# Patient Record
Sex: Male | Born: 1935 | Race: Black or African American | Hispanic: No | Smoking: Former smoker
Health system: Southern US, Community
[De-identification: ages and names within clinical notes are randomized; demographics above are authoritative.]

## PROBLEM LIST (undated history)

## (undated) DIAGNOSIS — E785 Hyperlipidemia, unspecified: Secondary | ICD-10-CM

## (undated) DIAGNOSIS — K5790 Diverticulosis of intestine, part unspecified, without perforation or abscess without bleeding: Secondary | ICD-10-CM

## (undated) DIAGNOSIS — I499 Cardiac arrhythmia, unspecified: Secondary | ICD-10-CM

## (undated) DIAGNOSIS — IMO0001 Reserved for inherently not codable concepts without codable children: Secondary | ICD-10-CM

## (undated) DIAGNOSIS — K922 Gastrointestinal hemorrhage, unspecified: Secondary | ICD-10-CM

## (undated) DIAGNOSIS — D649 Anemia, unspecified: Secondary | ICD-10-CM

## (undated) DIAGNOSIS — I2510 Atherosclerotic heart disease of native coronary artery without angina pectoris: Secondary | ICD-10-CM

## (undated) DIAGNOSIS — C61 Malignant neoplasm of prostate: Secondary | ICD-10-CM

## (undated) DIAGNOSIS — S32009A Unspecified fracture of unspecified lumbar vertebra, initial encounter for closed fracture: Secondary | ICD-10-CM

## (undated) DIAGNOSIS — N182 Chronic kidney disease, stage 2 (mild): Secondary | ICD-10-CM

## (undated) DIAGNOSIS — Z5189 Encounter for other specified aftercare: Secondary | ICD-10-CM

## (undated) DIAGNOSIS — K219 Gastro-esophageal reflux disease without esophagitis: Secondary | ICD-10-CM

## (undated) DIAGNOSIS — I219 Acute myocardial infarction, unspecified: Secondary | ICD-10-CM

## (undated) DIAGNOSIS — I10 Essential (primary) hypertension: Secondary | ICD-10-CM

## (undated) DIAGNOSIS — E119 Type 2 diabetes mellitus without complications: Secondary | ICD-10-CM

## (undated) DIAGNOSIS — M1990 Unspecified osteoarthritis, unspecified site: Secondary | ICD-10-CM

## (undated) DIAGNOSIS — F0390 Unspecified dementia without behavioral disturbance: Secondary | ICD-10-CM

## (undated) HISTORY — PX: OTHER SURGICAL HISTORY: SHX169

## (undated) HISTORY — PX: TONSILLECTOMY: SUR1361

---
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
## 1998-03-08 ENCOUNTER — Encounter: Admission: RE | Admit: 1998-03-08 | Discharge: 1998-06-06 | Payer: Self-pay | Admitting: Internal Medicine

## 1998-04-19 ENCOUNTER — Ambulatory Visit (HOSPITAL_COMMUNITY): Admission: RE | Admit: 1998-04-19 | Discharge: 1998-04-19 | Payer: Self-pay | Admitting: Orthopedic Surgery

## 1998-05-10 ENCOUNTER — Emergency Department (HOSPITAL_COMMUNITY): Admission: EM | Admit: 1998-05-10 | Discharge: 1998-05-10 | Payer: Self-pay | Admitting: Emergency Medicine

## 1998-05-16 ENCOUNTER — Emergency Department (HOSPITAL_COMMUNITY): Admission: EM | Admit: 1998-05-16 | Discharge: 1998-05-16 | Payer: Self-pay | Admitting: Emergency Medicine

## 1998-07-12 ENCOUNTER — Encounter: Admission: RE | Admit: 1998-07-12 | Discharge: 1998-10-07 | Payer: Self-pay | Admitting: Internal Medicine

## 1999-01-24 ENCOUNTER — Encounter: Admission: RE | Admit: 1999-01-24 | Discharge: 1999-01-26 | Payer: Self-pay | Admitting: Internal Medicine

## 1999-04-17 ENCOUNTER — Emergency Department (HOSPITAL_COMMUNITY): Admission: EM | Admit: 1999-04-17 | Discharge: 1999-04-17 | Payer: Self-pay | Admitting: Emergency Medicine

## 1999-04-17 ENCOUNTER — Encounter: Payer: Self-pay | Admitting: Emergency Medicine

## 1999-05-08 ENCOUNTER — Encounter: Admission: RE | Admit: 1999-05-08 | Discharge: 1999-05-12 | Payer: Self-pay | Admitting: Internal Medicine

## 1999-09-18 ENCOUNTER — Emergency Department (HOSPITAL_COMMUNITY): Admission: EM | Admit: 1999-09-18 | Discharge: 1999-09-18 | Payer: Self-pay | Admitting: Emergency Medicine

## 1999-11-22 ENCOUNTER — Encounter: Admission: RE | Admit: 1999-11-22 | Discharge: 2000-02-20 | Payer: Self-pay | Admitting: Orthopedic Surgery

## 2000-02-27 ENCOUNTER — Encounter: Admission: RE | Admit: 2000-02-27 | Discharge: 2000-05-27 | Payer: Self-pay | Admitting: Internal Medicine

## 2000-07-10 ENCOUNTER — Encounter: Payer: Self-pay | Admitting: Emergency Medicine

## 2000-07-10 ENCOUNTER — Emergency Department (HOSPITAL_COMMUNITY): Admission: EM | Admit: 2000-07-10 | Discharge: 2000-07-10 | Payer: Self-pay | Admitting: Emergency Medicine

## 2000-11-06 ENCOUNTER — Encounter: Admission: RE | Admit: 2000-11-06 | Discharge: 2000-11-11 | Payer: Self-pay | Admitting: Internal Medicine

## 2001-02-05 ENCOUNTER — Encounter: Admission: RE | Admit: 2001-02-05 | Discharge: 2001-05-06 | Payer: Self-pay | Admitting: Internal Medicine

## 2001-02-12 ENCOUNTER — Emergency Department (HOSPITAL_COMMUNITY): Admission: EM | Admit: 2001-02-12 | Discharge: 2001-02-12 | Payer: Self-pay | Admitting: Emergency Medicine

## 2001-02-12 ENCOUNTER — Encounter: Payer: Self-pay | Admitting: Emergency Medicine

## 2001-04-01 ENCOUNTER — Ambulatory Visit: Admission: RE | Admit: 2001-04-01 | Discharge: 2001-06-30 | Payer: Self-pay | Admitting: Radiation Oncology

## 2001-04-07 ENCOUNTER — Ambulatory Visit (HOSPITAL_COMMUNITY): Admission: RE | Admit: 2001-04-07 | Discharge: 2001-04-07 | Payer: Self-pay | Admitting: Urology

## 2001-04-07 ENCOUNTER — Encounter: Payer: Self-pay | Admitting: Urology

## 2001-05-07 ENCOUNTER — Encounter: Admission: RE | Admit: 2001-05-07 | Discharge: 2001-06-02 | Payer: Self-pay | Admitting: Internal Medicine

## 2001-07-01 ENCOUNTER — Ambulatory Visit: Admission: RE | Admit: 2001-07-01 | Discharge: 2001-09-29 | Payer: Self-pay | Admitting: Radiation Oncology

## 2001-08-13 ENCOUNTER — Encounter: Admission: RE | Admit: 2001-08-13 | Discharge: 2001-08-19 | Payer: Self-pay | Admitting: Internal Medicine

## 2001-09-14 ENCOUNTER — Emergency Department (HOSPITAL_COMMUNITY): Admission: EM | Admit: 2001-09-14 | Discharge: 2001-09-14 | Payer: Self-pay

## 2001-11-09 ENCOUNTER — Encounter: Admission: RE | Admit: 2001-11-09 | Discharge: 2001-12-19 | Payer: Self-pay | Admitting: Internal Medicine

## 2002-01-01 ENCOUNTER — Emergency Department (HOSPITAL_COMMUNITY): Admission: EM | Admit: 2002-01-01 | Discharge: 2002-01-01 | Payer: Self-pay | Admitting: *Deleted

## 2002-02-02 ENCOUNTER — Encounter (HOSPITAL_BASED_OUTPATIENT_CLINIC_OR_DEPARTMENT_OTHER): Admission: RE | Admit: 2002-02-02 | Discharge: 2002-02-27 | Payer: Self-pay | Admitting: Internal Medicine

## 2002-05-06 ENCOUNTER — Emergency Department (HOSPITAL_COMMUNITY): Admission: EM | Admit: 2002-05-06 | Discharge: 2002-05-06 | Payer: Self-pay | Admitting: Emergency Medicine

## 2002-05-11 ENCOUNTER — Encounter (HOSPITAL_BASED_OUTPATIENT_CLINIC_OR_DEPARTMENT_OTHER): Admission: RE | Admit: 2002-05-11 | Discharge: 2002-06-05 | Payer: Self-pay | Admitting: Orthopedic Surgery

## 2002-07-07 ENCOUNTER — Encounter: Payer: Self-pay | Admitting: Emergency Medicine

## 2002-07-07 ENCOUNTER — Emergency Department (HOSPITAL_COMMUNITY): Admission: EM | Admit: 2002-07-07 | Discharge: 2002-07-07 | Payer: Self-pay

## 2002-09-01 ENCOUNTER — Encounter (HOSPITAL_BASED_OUTPATIENT_CLINIC_OR_DEPARTMENT_OTHER): Admission: RE | Admit: 2002-09-01 | Discharge: 2002-11-30 | Payer: Self-pay | Admitting: Internal Medicine

## 2002-09-17 DIAGNOSIS — I219 Acute myocardial infarction, unspecified: Secondary | ICD-10-CM

## 2002-09-17 HISTORY — DX: Acute myocardial infarction, unspecified: I21.9

## 2002-09-21 ENCOUNTER — Encounter: Payer: Self-pay | Admitting: Emergency Medicine

## 2002-09-21 ENCOUNTER — Emergency Department (HOSPITAL_COMMUNITY): Admission: EM | Admit: 2002-09-21 | Discharge: 2002-09-21 | Payer: Self-pay | Admitting: Emergency Medicine

## 2002-09-27 ENCOUNTER — Encounter: Payer: Self-pay | Admitting: Emergency Medicine

## 2002-09-27 ENCOUNTER — Inpatient Hospital Stay (HOSPITAL_COMMUNITY): Admission: EM | Admit: 2002-09-27 | Discharge: 2002-10-04 | Payer: Self-pay | Admitting: Emergency Medicine

## 2002-09-28 ENCOUNTER — Encounter: Payer: Self-pay | Admitting: Cardiovascular Disease

## 2002-09-30 ENCOUNTER — Encounter: Payer: Self-pay | Admitting: Cardiovascular Disease

## 2002-10-01 ENCOUNTER — Encounter: Payer: Self-pay | Admitting: Cardiovascular Disease

## 2002-10-14 ENCOUNTER — Emergency Department (HOSPITAL_COMMUNITY): Admission: EM | Admit: 2002-10-14 | Discharge: 2002-10-14 | Payer: Self-pay | Admitting: Emergency Medicine

## 2002-10-14 ENCOUNTER — Encounter: Payer: Self-pay | Admitting: Emergency Medicine

## 2003-02-23 ENCOUNTER — Encounter (HOSPITAL_BASED_OUTPATIENT_CLINIC_OR_DEPARTMENT_OTHER): Admission: RE | Admit: 2003-02-23 | Discharge: 2003-05-24 | Payer: Self-pay | Admitting: Internal Medicine

## 2003-04-12 ENCOUNTER — Encounter: Payer: Self-pay | Admitting: Emergency Medicine

## 2003-04-12 ENCOUNTER — Emergency Department (HOSPITAL_COMMUNITY): Admission: EM | Admit: 2003-04-12 | Discharge: 2003-04-12 | Payer: Self-pay

## 2003-06-22 ENCOUNTER — Emergency Department (HOSPITAL_COMMUNITY): Admission: AD | Admit: 2003-06-22 | Discharge: 2003-06-22 | Payer: Self-pay | Admitting: Family Medicine

## 2003-08-29 ENCOUNTER — Emergency Department (HOSPITAL_COMMUNITY): Admission: EM | Admit: 2003-08-29 | Discharge: 2003-08-29 | Payer: Self-pay | Admitting: Emergency Medicine

## 2003-09-04 ENCOUNTER — Inpatient Hospital Stay (HOSPITAL_COMMUNITY): Admission: EM | Admit: 2003-09-04 | Discharge: 2003-09-08 | Payer: Self-pay | Admitting: Emergency Medicine

## 2003-09-06 ENCOUNTER — Encounter: Payer: Self-pay | Admitting: Internal Medicine

## 2003-09-30 ENCOUNTER — Encounter (HOSPITAL_BASED_OUTPATIENT_CLINIC_OR_DEPARTMENT_OTHER): Admission: RE | Admit: 2003-09-30 | Discharge: 2003-10-08 | Payer: Self-pay | Admitting: Internal Medicine

## 2003-10-13 ENCOUNTER — Encounter (HOSPITAL_BASED_OUTPATIENT_CLINIC_OR_DEPARTMENT_OTHER): Admission: RE | Admit: 2003-10-13 | Discharge: 2004-01-11 | Payer: Self-pay | Admitting: Internal Medicine

## 2003-12-09 ENCOUNTER — Emergency Department (HOSPITAL_COMMUNITY): Admission: AD | Admit: 2003-12-09 | Discharge: 2003-12-09 | Payer: Self-pay | Admitting: Family Medicine

## 2004-01-12 ENCOUNTER — Encounter (HOSPITAL_BASED_OUTPATIENT_CLINIC_OR_DEPARTMENT_OTHER): Admission: RE | Admit: 2004-01-12 | Discharge: 2004-01-25 | Payer: Self-pay | Admitting: Internal Medicine

## 2004-08-08 ENCOUNTER — Encounter (HOSPITAL_BASED_OUTPATIENT_CLINIC_OR_DEPARTMENT_OTHER): Admission: RE | Admit: 2004-08-08 | Discharge: 2004-11-06 | Payer: Self-pay | Admitting: Internal Medicine

## 2004-12-04 ENCOUNTER — Emergency Department (HOSPITAL_COMMUNITY): Admission: EM | Admit: 2004-12-04 | Discharge: 2004-12-04 | Payer: Self-pay | Admitting: *Deleted

## 2005-11-05 ENCOUNTER — Emergency Department (HOSPITAL_COMMUNITY): Admission: EM | Admit: 2005-11-05 | Discharge: 2005-11-05 | Payer: Self-pay | Admitting: Emergency Medicine

## 2005-12-15 ENCOUNTER — Emergency Department (HOSPITAL_COMMUNITY): Admission: EM | Admit: 2005-12-15 | Discharge: 2005-12-16 | Payer: Self-pay | Admitting: Emergency Medicine

## 2006-02-16 ENCOUNTER — Emergency Department (HOSPITAL_COMMUNITY): Admission: EM | Admit: 2006-02-16 | Discharge: 2006-02-16 | Payer: Self-pay | Admitting: Emergency Medicine

## 2006-03-01 ENCOUNTER — Ambulatory Visit: Payer: Self-pay | Admitting: Internal Medicine

## 2006-03-14 ENCOUNTER — Ambulatory Visit: Payer: Self-pay | Admitting: Internal Medicine

## 2006-03-15 ENCOUNTER — Ambulatory Visit: Payer: Self-pay | Admitting: Cardiology

## 2006-03-22 ENCOUNTER — Ambulatory Visit: Payer: Self-pay | Admitting: Internal Medicine

## 2006-07-22 ENCOUNTER — Ambulatory Visit: Payer: Self-pay | Admitting: Internal Medicine

## 2006-07-22 LAB — CONVERTED CEMR LAB
Hgb A1c MFr Bld: 8.4 % — ABNORMAL HIGH (ref 4.6–6.0)
Microalb Creat Ratio: 4.6 mg/g (ref 0.0–30.0)

## 2006-07-26 ENCOUNTER — Ambulatory Visit: Payer: Self-pay | Admitting: Internal Medicine

## 2006-10-23 ENCOUNTER — Ambulatory Visit: Payer: Self-pay | Admitting: Internal Medicine

## 2006-10-23 LAB — CONVERTED CEMR LAB
BUN: 24 mg/dL — ABNORMAL HIGH (ref 6–23)
Calcium: 10.4 mg/dL (ref 8.4–10.5)
GFR calc Af Amer: 69 mL/min
Glucose, Bld: 133 mg/dL — ABNORMAL HIGH (ref 70–99)
HDL: 47.1 mg/dL (ref >39.0)
Hgb A1c MFr Bld: 8.8 % — ABNORMAL HIGH (ref 4.6–6.0)
Potassium: 4.5 meq/L (ref 3.5–5.1)
Total CHOL/HDL Ratio: 5

## 2006-11-14 ENCOUNTER — Ambulatory Visit: Payer: Self-pay | Admitting: Internal Medicine

## 2006-12-20 ENCOUNTER — Ambulatory Visit: Payer: Self-pay | Admitting: Internal Medicine

## 2007-02-12 DIAGNOSIS — I10 Essential (primary) hypertension: Secondary | ICD-10-CM

## 2007-02-12 DIAGNOSIS — M109 Gout, unspecified: Secondary | ICD-10-CM

## 2007-06-02 ENCOUNTER — Emergency Department (HOSPITAL_COMMUNITY): Admission: EM | Admit: 2007-06-02 | Discharge: 2007-06-02 | Payer: Self-pay | Admitting: Emergency Medicine

## 2007-06-18 ENCOUNTER — Encounter (INDEPENDENT_AMBULATORY_CARE_PROVIDER_SITE_OTHER): Payer: Self-pay | Admitting: *Deleted

## 2007-06-25 ENCOUNTER — Ambulatory Visit: Payer: Self-pay | Admitting: Internal Medicine

## 2007-06-25 DIAGNOSIS — E1165 Type 2 diabetes mellitus with hyperglycemia: Secondary | ICD-10-CM

## 2007-06-25 DIAGNOSIS — Z8719 Personal history of other diseases of the digestive system: Secondary | ICD-10-CM

## 2007-06-25 DIAGNOSIS — Z8546 Personal history of malignant neoplasm of prostate: Secondary | ICD-10-CM

## 2007-06-25 DIAGNOSIS — E785 Hyperlipidemia, unspecified: Secondary | ICD-10-CM | POA: Insufficient documentation

## 2007-06-26 LAB — CONVERTED CEMR LAB: Creatinine,U: 72.8 mg/dL

## 2007-09-02 ENCOUNTER — Encounter (INDEPENDENT_AMBULATORY_CARE_PROVIDER_SITE_OTHER): Payer: Self-pay | Admitting: *Deleted

## 2007-09-26 ENCOUNTER — Ambulatory Visit: Payer: Self-pay | Admitting: Internal Medicine

## 2007-09-26 DIAGNOSIS — R9431 Abnormal electrocardiogram [ECG] [EKG]: Secondary | ICD-10-CM

## 2007-09-29 ENCOUNTER — Telehealth (INDEPENDENT_AMBULATORY_CARE_PROVIDER_SITE_OTHER): Payer: Self-pay | Admitting: *Deleted

## 2007-10-07 LAB — CONVERTED CEMR LAB
ALT: 30 units/L (ref 0–53)
AST: 26 units/L (ref 0–37)
BUN: 16 mg/dL (ref 6–23)
Basophils Absolute: 0.1 10*3/uL (ref 0.0–0.1)
Cholesterol: 217 mg/dL (ref 0–200)
Creatinine, Ser: 1.3 mg/dL (ref 0.4–1.5)
Creatinine,U: 127.6 mg/dL
Direct LDL: 144.2 mg/dL
GFR calc Af Amer: 68 mL/min
Glucose, Bld: 168 mg/dL — ABNORMAL HIGH (ref 70–99)
HCT: 44.2 % (ref 39.0–52.0)
HDL: 50.9 mg/dL (ref >39.0)
Hemoglobin: 14.8 g/dL (ref 13.0–17.0)
Microalb Creat Ratio: 19.6 mg/g (ref 0.0–30.0)
Neutro Abs: 4 10*3/uL (ref 1.4–7.7)
PSA: 2.76 ng/mL (ref 0.10–4.00)
Potassium: 4.1 meq/L (ref 3.5–5.1)
Total CHOL/HDL Ratio: 4.3
Triglycerides: 101 mg/dL (ref 0–149)

## 2007-10-09 ENCOUNTER — Ambulatory Visit: Payer: Self-pay

## 2007-10-09 ENCOUNTER — Encounter: Payer: Self-pay | Admitting: Internal Medicine

## 2007-10-20 ENCOUNTER — Encounter (INDEPENDENT_AMBULATORY_CARE_PROVIDER_SITE_OTHER): Payer: Self-pay | Admitting: *Deleted

## 2007-10-29 ENCOUNTER — Encounter: Payer: Self-pay | Admitting: Internal Medicine

## 2007-11-26 ENCOUNTER — Encounter: Payer: Self-pay | Admitting: Internal Medicine

## 2008-01-25 ENCOUNTER — Emergency Department (HOSPITAL_COMMUNITY): Admission: EM | Admit: 2008-01-25 | Discharge: 2008-01-25 | Payer: Self-pay | Admitting: Emergency Medicine

## 2008-02-11 ENCOUNTER — Encounter (INDEPENDENT_AMBULATORY_CARE_PROVIDER_SITE_OTHER): Payer: Self-pay | Admitting: *Deleted

## 2008-02-12 ENCOUNTER — Emergency Department (HOSPITAL_COMMUNITY): Admission: EM | Admit: 2008-02-12 | Discharge: 2008-02-12 | Payer: Self-pay | Admitting: Family Medicine

## 2008-02-19 ENCOUNTER — Telehealth (INDEPENDENT_AMBULATORY_CARE_PROVIDER_SITE_OTHER): Payer: Self-pay | Admitting: *Deleted

## 2008-02-19 ENCOUNTER — Ambulatory Visit: Payer: Self-pay | Admitting: Internal Medicine

## 2008-02-19 DIAGNOSIS — K219 Gastro-esophageal reflux disease without esophagitis: Secondary | ICD-10-CM | POA: Insufficient documentation

## 2008-02-19 DIAGNOSIS — M1990 Unspecified osteoarthritis, unspecified site: Secondary | ICD-10-CM | POA: Insufficient documentation

## 2008-02-20 ENCOUNTER — Telehealth (INDEPENDENT_AMBULATORY_CARE_PROVIDER_SITE_OTHER): Payer: Self-pay | Admitting: *Deleted

## 2008-02-29 ENCOUNTER — Emergency Department (HOSPITAL_COMMUNITY): Admission: EM | Admit: 2008-02-29 | Discharge: 2008-02-29 | Payer: Self-pay | Admitting: Emergency Medicine

## 2008-03-05 ENCOUNTER — Encounter (INDEPENDENT_AMBULATORY_CARE_PROVIDER_SITE_OTHER): Payer: Self-pay | Admitting: *Deleted

## 2008-03-08 ENCOUNTER — Ambulatory Visit: Payer: Self-pay | Admitting: Internal Medicine

## 2008-03-08 ENCOUNTER — Telehealth: Payer: Self-pay | Admitting: Internal Medicine

## 2008-03-08 LAB — CONVERTED CEMR LAB: Glucose, Bld: 307 mg/dL

## 2008-03-09 ENCOUNTER — Telehealth: Payer: Self-pay | Admitting: Internal Medicine

## 2008-03-10 ENCOUNTER — Telehealth (INDEPENDENT_AMBULATORY_CARE_PROVIDER_SITE_OTHER): Payer: Self-pay | Admitting: *Deleted

## 2008-03-10 ENCOUNTER — Ambulatory Visit: Payer: Self-pay | Admitting: Internal Medicine

## 2008-03-10 LAB — CONVERTED CEMR LAB
ALT: 63 units/L — ABNORMAL HIGH (ref 0–53)
Basophils Relative: 0.2 % (ref 0.0–1.0)
Chloride: 103 meq/L (ref 96–112)
Eosinophils Relative: 1.5 % (ref 0.0–5.0)
GFR calc Af Amer: 58 mL/min
Glucose, Bld: 309 mg/dL — ABNORMAL HIGH (ref 70–99)
HCT: 34.7 % — ABNORMAL LOW (ref 39.0–52.0)
Hemoglobin: 11.4 g/dL — ABNORMAL LOW (ref 13.0–17.0)
Hgb A1c MFr Bld: 9.1 % — ABNORMAL HIGH (ref 4.6–6.0)
Neutro Abs: 12.3 10*3/uL — ABNORMAL HIGH (ref 1.4–7.7)
Platelets: 367 10*3/uL (ref 150–400)
Potassium: 4.3 meq/L (ref 3.5–5.1)
Uric Acid, Serum: 8.1 mg/dL — ABNORMAL HIGH (ref 4.0–7.8)
WBC: 14.2 10*3/uL — ABNORMAL HIGH (ref 4.5–10.5)

## 2008-03-11 ENCOUNTER — Encounter (INDEPENDENT_AMBULATORY_CARE_PROVIDER_SITE_OTHER): Payer: Self-pay | Admitting: *Deleted

## 2008-03-12 ENCOUNTER — Telehealth: Payer: Self-pay | Admitting: Internal Medicine

## 2008-03-16 ENCOUNTER — Emergency Department (HOSPITAL_COMMUNITY): Admission: EM | Admit: 2008-03-16 | Discharge: 2008-03-17 | Payer: Self-pay | Admitting: Emergency Medicine

## 2008-03-16 ENCOUNTER — Encounter: Payer: Self-pay | Admitting: Internal Medicine

## 2008-03-18 ENCOUNTER — Telehealth (INDEPENDENT_AMBULATORY_CARE_PROVIDER_SITE_OTHER): Payer: Self-pay | Admitting: *Deleted

## 2008-03-22 ENCOUNTER — Telehealth (INDEPENDENT_AMBULATORY_CARE_PROVIDER_SITE_OTHER): Payer: Self-pay | Admitting: *Deleted

## 2008-04-05 ENCOUNTER — Telehealth (INDEPENDENT_AMBULATORY_CARE_PROVIDER_SITE_OTHER): Payer: Self-pay | Admitting: *Deleted

## 2008-04-05 ENCOUNTER — Emergency Department (HOSPITAL_COMMUNITY): Admission: EM | Admit: 2008-04-05 | Discharge: 2008-04-05 | Payer: Self-pay | Admitting: *Deleted

## 2008-04-06 ENCOUNTER — Ambulatory Visit: Payer: Self-pay | Admitting: Internal Medicine

## 2008-04-06 DIAGNOSIS — F411 Generalized anxiety disorder: Secondary | ICD-10-CM | POA: Insufficient documentation

## 2008-04-08 ENCOUNTER — Encounter: Payer: Self-pay | Admitting: Internal Medicine

## 2008-04-09 ENCOUNTER — Encounter: Payer: Self-pay | Admitting: Internal Medicine

## 2008-04-09 ENCOUNTER — Telehealth: Payer: Self-pay | Admitting: Internal Medicine

## 2008-04-21 ENCOUNTER — Telehealth (INDEPENDENT_AMBULATORY_CARE_PROVIDER_SITE_OTHER): Payer: Self-pay | Admitting: *Deleted

## 2008-04-26 ENCOUNTER — Ambulatory Visit: Payer: Self-pay | Admitting: Internal Medicine

## 2008-05-06 ENCOUNTER — Encounter: Payer: Self-pay | Admitting: Internal Medicine

## 2008-05-16 ENCOUNTER — Encounter: Payer: Self-pay | Admitting: Internal Medicine

## 2008-05-18 ENCOUNTER — Telehealth: Payer: Self-pay | Admitting: Internal Medicine

## 2008-05-25 ENCOUNTER — Telehealth: Payer: Self-pay | Admitting: Internal Medicine

## 2008-06-16 ENCOUNTER — Ambulatory Visit: Payer: Self-pay | Admitting: Internal Medicine

## 2008-06-21 ENCOUNTER — Encounter (INDEPENDENT_AMBULATORY_CARE_PROVIDER_SITE_OTHER): Payer: Self-pay | Admitting: *Deleted

## 2008-06-29 ENCOUNTER — Encounter (INDEPENDENT_AMBULATORY_CARE_PROVIDER_SITE_OTHER): Payer: Self-pay | Admitting: *Deleted

## 2008-07-02 ENCOUNTER — Ambulatory Visit: Payer: Self-pay | Admitting: Internal Medicine

## 2008-07-02 ENCOUNTER — Telehealth: Payer: Self-pay | Admitting: Internal Medicine

## 2008-07-06 LAB — CONVERTED CEMR LAB
ALT: 21 units/L (ref 0–53)
BUN: 21 mg/dL (ref 6–23)
CO2: 26 meq/L (ref 19–32)
Cholesterol: 197 mg/dL (ref 0–200)
Glucose, Bld: 105 mg/dL — ABNORMAL HIGH (ref 70–99)
HDL: 71.5 mg/dL (ref >39.0)
LDL Cholesterol: 110 mg/dL — ABNORMAL HIGH (ref 0–99)
Potassium: 4.5 meq/L (ref 3.5–5.1)
Sodium: 140 meq/L (ref 135–145)
Total CHOL/HDL Ratio: 2.8
Triglycerides: 79 mg/dL (ref 0–149)
Uric Acid, Serum: 5 mg/dL (ref 4.0–7.8)
VLDL: 16 mg/dL (ref 0–40)

## 2008-07-22 ENCOUNTER — Ambulatory Visit: Payer: Self-pay | Admitting: Internal Medicine

## 2008-07-27 ENCOUNTER — Telehealth (INDEPENDENT_AMBULATORY_CARE_PROVIDER_SITE_OTHER): Payer: Self-pay | Admitting: *Deleted

## 2008-08-04 ENCOUNTER — Telehealth: Payer: Self-pay | Admitting: Internal Medicine

## 2008-08-05 ENCOUNTER — Encounter: Payer: Self-pay | Admitting: Internal Medicine

## 2008-08-16 ENCOUNTER — Encounter: Payer: Self-pay | Admitting: Internal Medicine

## 2008-10-27 ENCOUNTER — Encounter: Payer: Self-pay | Admitting: Internal Medicine

## 2008-10-28 ENCOUNTER — Encounter: Payer: Self-pay | Admitting: Internal Medicine

## 2008-11-03 ENCOUNTER — Encounter: Payer: Self-pay | Admitting: Internal Medicine

## 2008-11-29 ENCOUNTER — Encounter: Payer: Self-pay | Admitting: Internal Medicine

## 2008-12-03 ENCOUNTER — Telehealth (INDEPENDENT_AMBULATORY_CARE_PROVIDER_SITE_OTHER): Payer: Self-pay | Admitting: *Deleted

## 2008-12-07 ENCOUNTER — Telehealth (INDEPENDENT_AMBULATORY_CARE_PROVIDER_SITE_OTHER): Payer: Self-pay | Admitting: *Deleted

## 2008-12-13 ENCOUNTER — Telehealth (INDEPENDENT_AMBULATORY_CARE_PROVIDER_SITE_OTHER): Payer: Self-pay | Admitting: *Deleted

## 2008-12-21 ENCOUNTER — Telehealth (INDEPENDENT_AMBULATORY_CARE_PROVIDER_SITE_OTHER): Payer: Self-pay | Admitting: *Deleted

## 2009-01-05 ENCOUNTER — Encounter: Payer: Self-pay | Admitting: Internal Medicine

## 2009-02-17 ENCOUNTER — Encounter: Admission: RE | Admit: 2009-02-17 | Discharge: 2009-02-17 | Payer: Self-pay | Admitting: Family Medicine

## 2009-02-25 ENCOUNTER — Inpatient Hospital Stay (HOSPITAL_COMMUNITY): Admission: EM | Admit: 2009-02-25 | Discharge: 2009-02-26 | Payer: Self-pay | Admitting: Emergency Medicine

## 2009-03-17 ENCOUNTER — Encounter (HOSPITAL_COMMUNITY): Admission: RE | Admit: 2009-03-17 | Discharge: 2009-05-19 | Payer: Self-pay | Admitting: Family Medicine

## 2009-04-03 ENCOUNTER — Inpatient Hospital Stay (HOSPITAL_COMMUNITY): Admission: EM | Admit: 2009-04-03 | Discharge: 2009-04-07 | Payer: Self-pay | Admitting: Emergency Medicine

## 2009-06-08 ENCOUNTER — Encounter: Payer: Self-pay | Admitting: Internal Medicine

## 2009-10-25 ENCOUNTER — Observation Stay (HOSPITAL_COMMUNITY): Admission: EM | Admit: 2009-10-25 | Discharge: 2009-10-25 | Payer: Self-pay | Admitting: Emergency Medicine

## 2009-11-12 IMAGING — CR DG ABDOMEN ACUTE W/ 1V CHEST
4 series · 4 of 4 positions shown · non-contrast
Comparison: None

CLINICAL DATA: Abdominal pain

ACUTE ABDOMEN SERIES (ABDOMEN 2 VIEW & CHEST 1 VIEW)

[w chest pa]
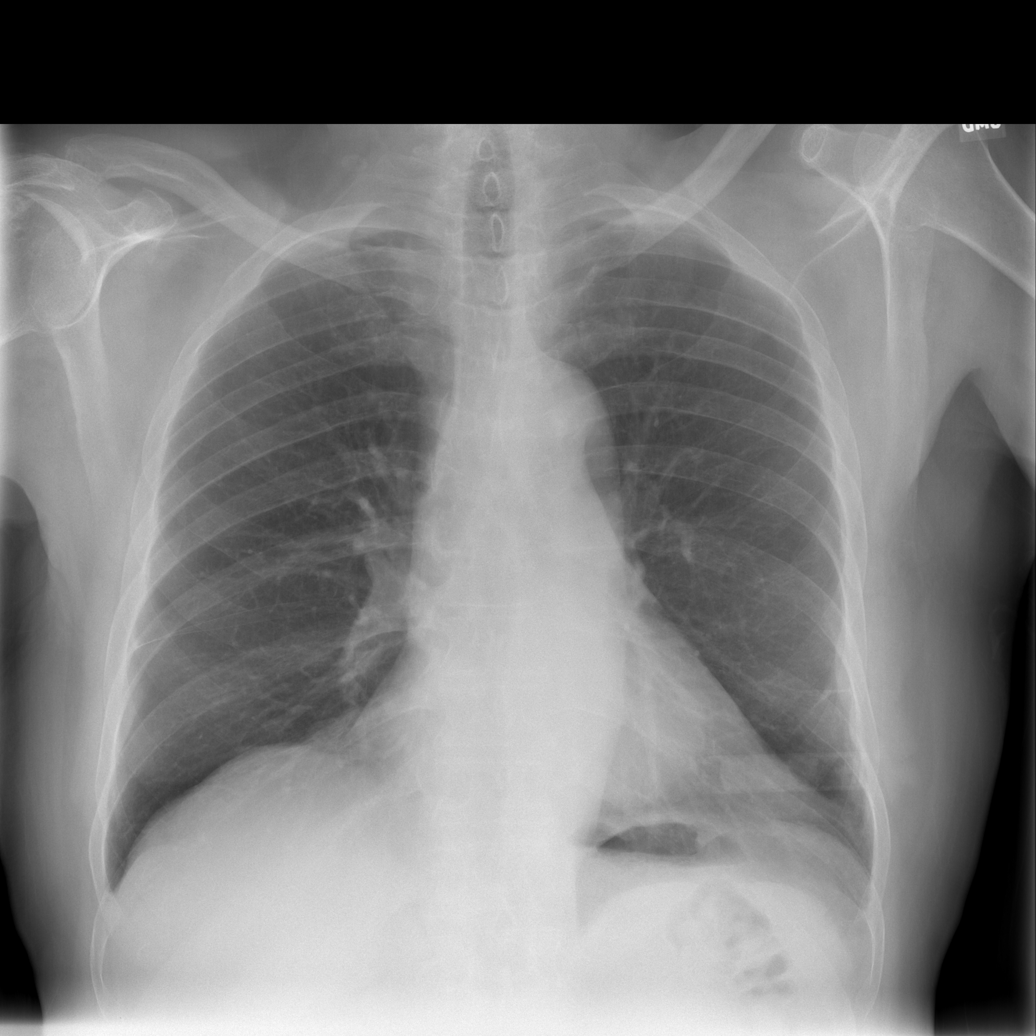

[w abdomen upright *]
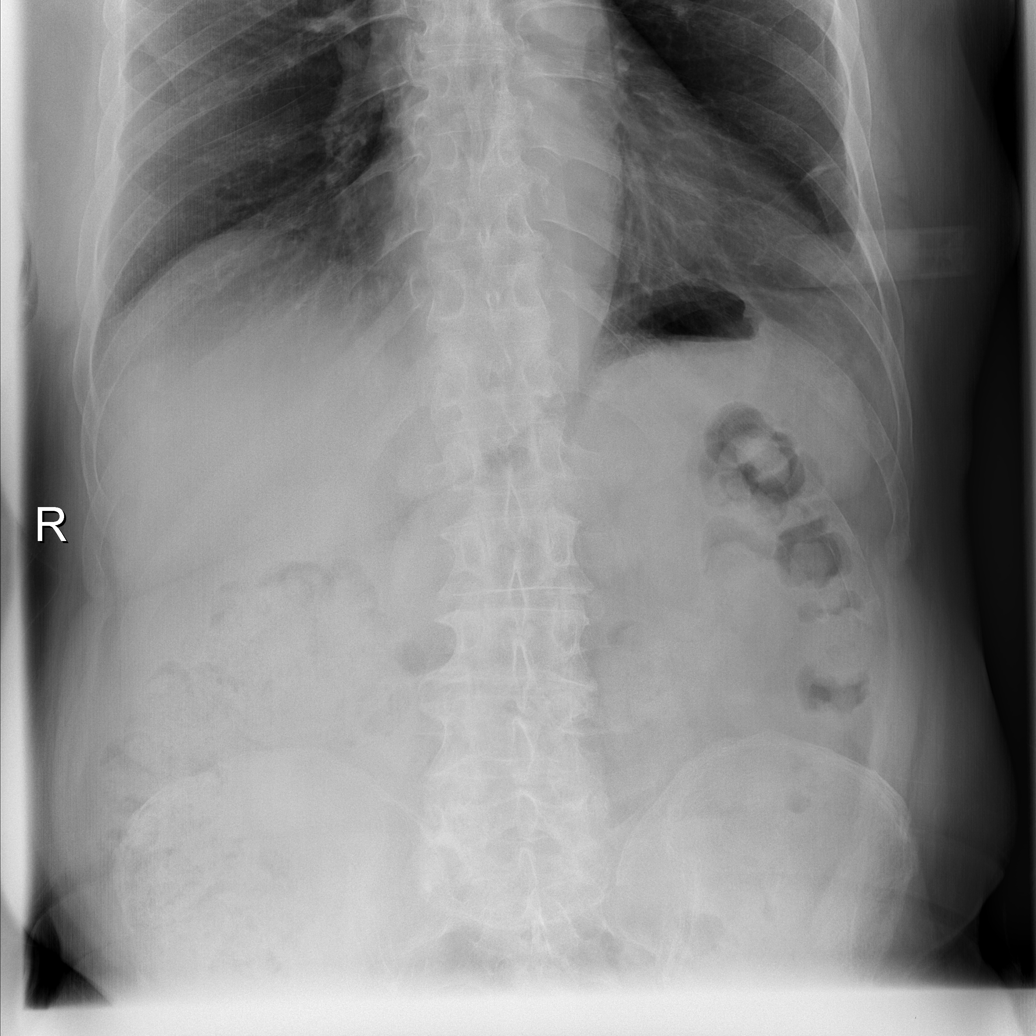

[t abdomen supine (1 of 2)]
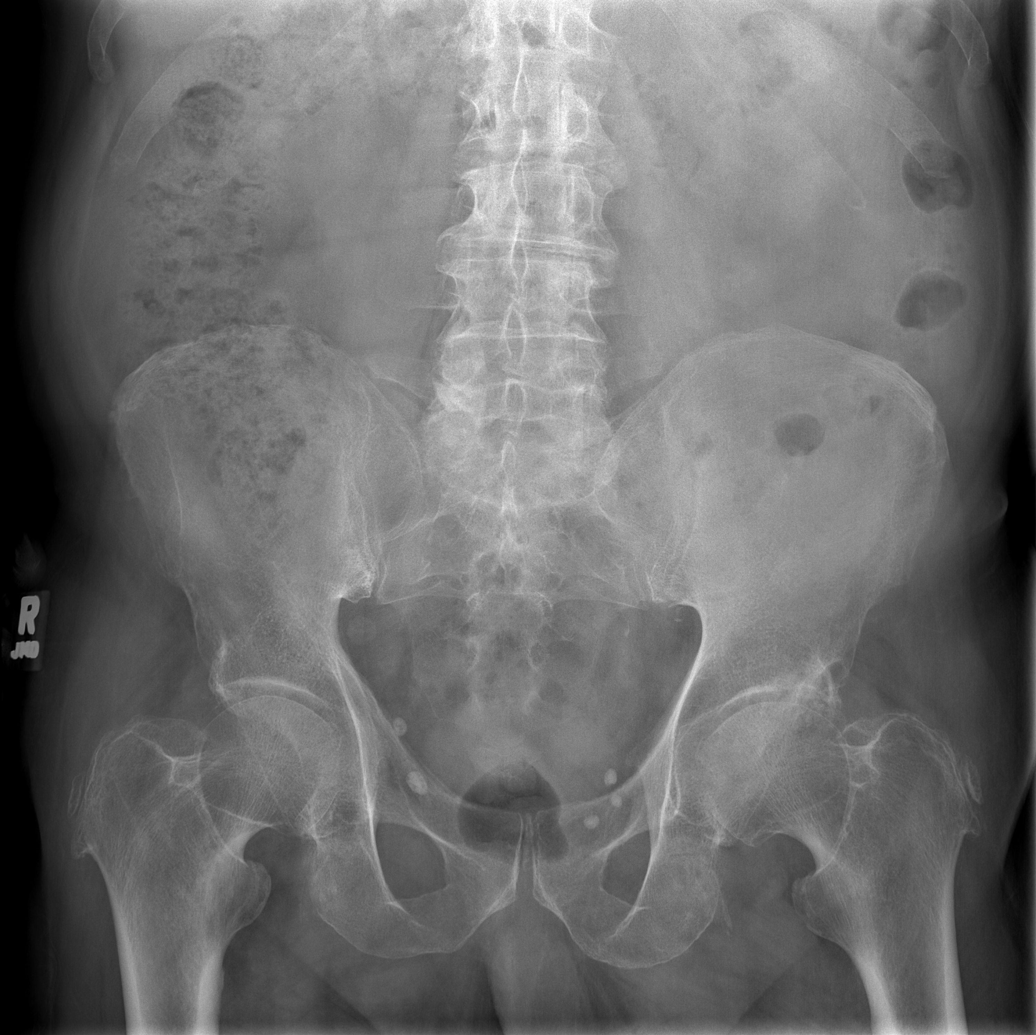

[t abdomen supine (2 of 2)]
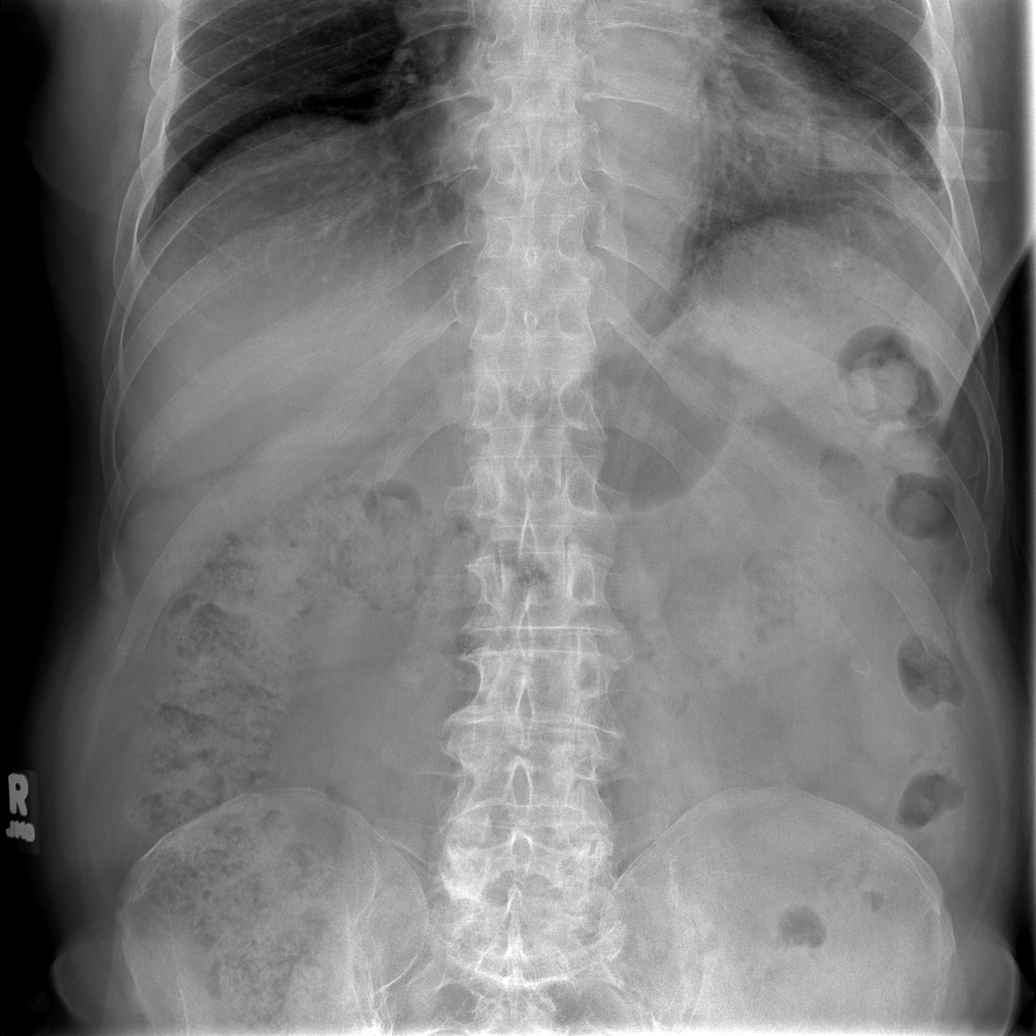

[4 of 4 positions shown; findings below may reference images not displayed]

FINDINGS: Heart size is normal.

No pleural effusions or pulmonary edema.

There is no airspace densities identified.

No abnormally dilated loops of small bowel noted.

There are no air-fluid levels.

Gas and stool seen throughout the colon up to the level the rectum.
IMPRESSION: 1.  Nonobstructive bowel gas pattern.

## 2010-06-19 ENCOUNTER — Emergency Department (HOSPITAL_COMMUNITY)
Admission: EM | Admit: 2010-06-19 | Discharge: 2010-06-20 | Payer: Self-pay | Source: Home / Self Care | Admitting: Emergency Medicine

## 2010-08-03 ENCOUNTER — Emergency Department (HOSPITAL_COMMUNITY): Admission: EM | Admit: 2010-08-03 | Discharge: 2010-08-03 | Payer: Self-pay | Admitting: Emergency Medicine

## 2010-10-08 ENCOUNTER — Encounter: Payer: Self-pay | Admitting: Internal Medicine

## 2010-11-30 LAB — URINALYSIS, ROUTINE W REFLEX MICROSCOPIC
Bilirubin Urine: NEGATIVE
Ketones, ur: NEGATIVE mg/dL
Nitrite: NEGATIVE
Protein, ur: NEGATIVE mg/dL

## 2010-11-30 LAB — URINE CULTURE
Colony Count: >=100000
Culture  Setup Time: 201110040449

## 2010-11-30 LAB — BASIC METABOLIC PANEL
CO2: 26 mEq/L (ref 19–32)
Calcium: 9.3 mg/dL (ref 8.4–10.5)
Chloride: 103 mEq/L (ref 96–112)
GFR calc Af Amer: 51 mL/min — ABNORMAL LOW (ref >60)
Sodium: 135 mEq/L (ref 135–145)

## 2010-11-30 LAB — RAPID URINE DRUG SCREEN, HOSP PERFORMED
Benzodiazepines: NOT DETECTED
Cocaine: NOT DETECTED
Tetrahydrocannabinol: NOT DETECTED

## 2010-11-30 LAB — CBC
HCT: 43.2 % (ref 39.0–52.0)
Hemoglobin: 14.3 g/dL (ref 13.0–17.0)
MCHC: 33 g/dL (ref 30.0–36.0)
RBC: 5.27 MIL/uL (ref 4.22–5.81)
WBC: 7.9 10*3/uL (ref 4.0–10.5)

## 2010-11-30 LAB — DIFFERENTIAL
Lymphocytes Relative: 18 % (ref 12–46)
Lymphs Abs: 1.4 10*3/uL (ref 0.7–4.0)
Monocytes Absolute: 0.6 10*3/uL (ref 0.1–1.0)
Monocytes Relative: 8 % (ref 3–12)
Neutro Abs: 5.7 10*3/uL (ref 1.7–7.7)

## 2010-11-30 LAB — GLUCOSE, CAPILLARY: Glucose-Capillary: 173 mg/dL — ABNORMAL HIGH (ref 70–99)

## 2010-11-30 LAB — ETHANOL: Alcohol, Ethyl (B): <5 mg/dL (ref 0–10)

## 2010-12-06 LAB — URINE CULTURE

## 2010-12-06 LAB — BASIC METABOLIC PANEL
CO2: 24 mEq/L (ref 19–32)
Calcium: 9.6 mg/dL (ref 8.4–10.5)
Chloride: 99 mEq/L (ref 96–112)
Glucose, Bld: 425 mg/dL — ABNORMAL HIGH (ref 70–99)
Sodium: 130 mEq/L — ABNORMAL LOW (ref 135–145)

## 2010-12-06 LAB — GLUCOSE, CAPILLARY
Glucose-Capillary: 237 mg/dL — ABNORMAL HIGH (ref 70–99)
Glucose-Capillary: 271 mg/dL — ABNORMAL HIGH (ref 70–99)

## 2010-12-06 LAB — URINALYSIS, ROUTINE W REFLEX MICROSCOPIC
Bilirubin Urine: NEGATIVE
Glucose, UA: >1000 mg/dL — AB
Specific Gravity, Urine: 1.019 (ref 1.005–1.030)
Urobilinogen, UA: 0.2 mg/dL (ref 0.0–1.0)

## 2010-12-06 LAB — URINE MICROSCOPIC-ADD ON

## 2010-12-24 LAB — COMPREHENSIVE METABOLIC PANEL
ALT: 23 U/L (ref 0–53)
Albumin: 3.8 g/dL (ref 3.5–5.2)
Alkaline Phosphatase: 63 U/L (ref 39–117)
Alkaline Phosphatase: 82 U/L (ref 39–117)
BUN: 21 mg/dL (ref 6–23)
BUN: 9 mg/dL (ref 6–23)
Calcium: 9.8 mg/dL (ref 8.4–10.5)
Creatinine, Ser: 0.96 mg/dL (ref 0.4–1.5)
Glucose, Bld: 146 mg/dL — ABNORMAL HIGH (ref 70–99)
Glucose, Bld: 689 mg/dL (ref 70–99)
Potassium: 3.9 mEq/L (ref 3.5–5.1)
Potassium: 5.1 mEq/L (ref 3.5–5.1)
Sodium: 128 mEq/L — ABNORMAL LOW (ref 135–145)
Total Bilirubin: 0.6 mg/dL (ref 0.3–1.2)
Total Protein: 6 g/dL (ref 6.0–8.3)
Total Protein: 7.6 g/dL (ref 6.0–8.3)

## 2010-12-24 LAB — GLUCOSE, CAPILLARY
Glucose-Capillary: 115 mg/dL — ABNORMAL HIGH (ref 70–99)
Glucose-Capillary: 123 mg/dL — ABNORMAL HIGH (ref 70–99)
Glucose-Capillary: 179 mg/dL — ABNORMAL HIGH (ref 70–99)
Glucose-Capillary: 224 mg/dL — ABNORMAL HIGH (ref 70–99)
Glucose-Capillary: 234 mg/dL — ABNORMAL HIGH (ref 70–99)
Glucose-Capillary: 246 mg/dL — ABNORMAL HIGH (ref 70–99)
Glucose-Capillary: 304 mg/dL — ABNORMAL HIGH (ref 70–99)
Glucose-Capillary: 349 mg/dL — ABNORMAL HIGH (ref 70–99)
Glucose-Capillary: 378 mg/dL — ABNORMAL HIGH (ref 70–99)
Glucose-Capillary: 421 mg/dL — ABNORMAL HIGH (ref 70–99)
Glucose-Capillary: 532 mg/dL (ref 70–99)
Glucose-Capillary: 592 mg/dL (ref 70–99)

## 2010-12-24 LAB — BASIC METABOLIC PANEL
BUN: 9 mg/dL (ref 6–23)
Chloride: 106 mEq/L (ref 96–112)
Glucose, Bld: 226 mg/dL — ABNORMAL HIGH (ref 70–99)
Potassium: 4.5 mEq/L (ref 3.5–5.1)

## 2010-12-24 LAB — URINALYSIS, ROUTINE W REFLEX MICROSCOPIC
Ketones, ur: NEGATIVE mg/dL
Leukocytes, UA: NEGATIVE
Nitrite: NEGATIVE
Protein, ur: NEGATIVE mg/dL
Urobilinogen, UA: 0.2 mg/dL (ref 0.0–1.0)

## 2010-12-24 LAB — KETONES, QUALITATIVE: Acetone, Bld: NEGATIVE

## 2010-12-24 LAB — CBC
HCT: 36.2 % — ABNORMAL LOW (ref 39.0–52.0)
Hemoglobin: 11.9 g/dL — ABNORMAL LOW (ref 13.0–17.0)
Hemoglobin: 13.6 g/dL (ref 13.0–17.0)
MCHC: 32 g/dL (ref 30.0–36.0)
MCV: 83.9 fL (ref 78.0–100.0)
Platelets: 256 10*3/uL (ref 150–400)
RDW: 12.9 % (ref 11.5–15.5)
RDW: 13.4 % (ref 11.5–15.5)

## 2010-12-24 LAB — DIFFERENTIAL
Lymphs Abs: 1.6 10*3/uL (ref 0.7–4.0)
Monocytes Absolute: 1.1 10*3/uL — ABNORMAL HIGH (ref 0.1–1.0)
Monocytes Relative: 12 % (ref 3–12)
Neutro Abs: 6.2 10*3/uL (ref 1.7–7.7)
Neutrophils Relative %: 69 % (ref 43–77)

## 2010-12-24 LAB — CULTURE, BLOOD (ROUTINE X 2): Culture: NO GROWTH

## 2010-12-24 LAB — CK TOTAL AND CKMB (NOT AT ARMC)
CK, MB: 1.8 ng/mL (ref 0.3–4.0)
Total CK: 99 U/L (ref 7–232)

## 2010-12-24 LAB — TROPONIN I: Troponin I: 0.02 ng/mL (ref 0.00–0.06)

## 2010-12-24 LAB — URINE MICROSCOPIC-ADD ON: Urine-Other: NONE SEEN

## 2010-12-25 LAB — CBC
HCT: 40 % (ref 39.0–52.0)
Hemoglobin: 12.2 g/dL — ABNORMAL LOW (ref 13.0–17.0)
Hemoglobin: 13 g/dL (ref 13.0–17.0)
Hemoglobin: 13.4 g/dL (ref 13.0–17.0)
MCHC: 32.5 g/dL (ref 30.0–36.0)
MCHC: 32.7 g/dL (ref 30.0–36.0)
MCV: 83.7 fL (ref 78.0–100.0)
RBC: 4.41 MIL/uL (ref 4.22–5.81)
RBC: 4.78 MIL/uL (ref 4.22–5.81)
RBC: 4.94 MIL/uL (ref 4.22–5.81)
RDW: 12.9 % (ref 11.5–15.5)
RDW: 13.5 % (ref 11.5–15.5)
WBC: 6.7 10*3/uL (ref 4.0–10.5)

## 2010-12-25 LAB — COMPREHENSIVE METABOLIC PANEL
ALT: 19 U/L (ref 0–53)
Alkaline Phosphatase: 64 U/L (ref 39–117)
CO2: 25 mEq/L (ref 19–32)
Chloride: 103 mEq/L (ref 96–112)
GFR calc non Af Amer: 54 mL/min — ABNORMAL LOW (ref >60)
Glucose, Bld: 301 mg/dL — ABNORMAL HIGH (ref 70–99)
Potassium: 4.3 mEq/L (ref 3.5–5.1)
Sodium: 134 mEq/L — ABNORMAL LOW (ref 135–145)
Total Bilirubin: 0.5 mg/dL (ref 0.3–1.2)

## 2010-12-25 LAB — CARDIAC PANEL(CRET KIN+CKTOT+MB+TROPI)
CK, MB: 1.3 ng/mL (ref 0.3–4.0)
CK, MB: 1.6 ng/mL (ref 0.3–4.0)
Relative Index: INVALID (ref 0.0–2.5)
Total CK: 76 U/L (ref 7–232)
Total CK: 89 U/L (ref 7–232)
Total CK: 94 U/L (ref 7–232)
Troponin I: 0.01 ng/mL (ref 0.00–0.06)

## 2010-12-25 LAB — LIPID PANEL
Cholesterol: 190 mg/dL (ref 0–200)
LDL Cholesterol: 129 mg/dL — ABNORMAL HIGH (ref 0–99)
Total CHOL/HDL Ratio: 4.5 RATIO
Triglycerides: 97 mg/dL (ref <150)
VLDL: 19 mg/dL (ref 0–40)

## 2010-12-25 LAB — BASIC METABOLIC PANEL
BUN: 13 mg/dL (ref 6–23)
CO2: 25 mEq/L (ref 19–32)
Calcium: 9.4 mg/dL (ref 8.4–10.5)
Calcium: 9.9 mg/dL (ref 8.4–10.5)
Creatinine, Ser: 1.15 mg/dL (ref 0.4–1.5)
GFR calc Af Amer: >60 mL/min (ref >60)
GFR calc non Af Amer: >60 mL/min (ref >60)
GFR calc non Af Amer: >60 mL/min (ref >60)
Glucose, Bld: 218 mg/dL — ABNORMAL HIGH (ref 70–99)
Glucose, Bld: 222 mg/dL — ABNORMAL HIGH (ref 70–99)
Sodium: 133 mEq/L — ABNORMAL LOW (ref 135–145)
Sodium: 136 mEq/L (ref 135–145)

## 2010-12-25 LAB — DIFFERENTIAL
Basophils Absolute: 0 10*3/uL (ref 0.0–0.1)
Eosinophils Relative: 2 % (ref 0–5)
Lymphocytes Relative: 24 % (ref 12–46)

## 2010-12-25 LAB — CK TOTAL AND CKMB (NOT AT ARMC)
CK, MB: 1.3 ng/mL (ref 0.3–4.0)
Relative Index: 1.1 (ref 0.0–2.5)

## 2010-12-25 LAB — POCT CARDIAC MARKERS: CKMB, poc: <1 ng/mL — ABNORMAL LOW (ref 1.0–8.0)

## 2010-12-25 LAB — TROPONIN I: Troponin I: 0.01 ng/mL (ref 0.00–0.06)

## 2010-12-25 LAB — HEMOGLOBIN A1C: Hgb A1c MFr Bld: 12.6 % — ABNORMAL HIGH (ref 4.6–6.1)

## 2010-12-25 LAB — GLUCOSE, CAPILLARY

## 2011-01-04 ENCOUNTER — Emergency Department (HOSPITAL_COMMUNITY)
Admission: EM | Admit: 2011-01-04 | Discharge: 2011-01-04 | Disposition: A | Discharge status: Home / Self Care | Payer: Medicare Other | Attending: Emergency Medicine | Admitting: Emergency Medicine

## 2011-01-04 ENCOUNTER — Emergency Department (HOSPITAL_COMMUNITY): Payer: Medicare Other

## 2011-01-04 DIAGNOSIS — R0982 Postnasal drip: Secondary | ICD-10-CM | POA: Insufficient documentation

## 2011-01-04 DIAGNOSIS — J069 Acute upper respiratory infection, unspecified: Secondary | ICD-10-CM | POA: Insufficient documentation

## 2011-01-04 DIAGNOSIS — IMO0001 Reserved for inherently not codable concepts without codable children: Secondary | ICD-10-CM | POA: Insufficient documentation

## 2011-01-04 DIAGNOSIS — E119 Type 2 diabetes mellitus without complications: Secondary | ICD-10-CM | POA: Insufficient documentation

## 2011-01-04 DIAGNOSIS — R61 Generalized hyperhidrosis: Secondary | ICD-10-CM | POA: Insufficient documentation

## 2011-01-04 DIAGNOSIS — J3489 Other specified disorders of nose and nasal sinuses: Secondary | ICD-10-CM | POA: Insufficient documentation

## 2011-01-04 DIAGNOSIS — J329 Chronic sinusitis, unspecified: Secondary | ICD-10-CM | POA: Insufficient documentation

## 2011-01-04 DIAGNOSIS — R05 Cough: Secondary | ICD-10-CM | POA: Insufficient documentation

## 2011-01-04 DIAGNOSIS — J029 Acute pharyngitis, unspecified: Secondary | ICD-10-CM | POA: Insufficient documentation

## 2011-01-04 DIAGNOSIS — J309 Allergic rhinitis, unspecified: Secondary | ICD-10-CM | POA: Insufficient documentation

## 2011-01-04 DIAGNOSIS — R059 Cough, unspecified: Secondary | ICD-10-CM | POA: Insufficient documentation

## 2011-01-04 LAB — DIFFERENTIAL
Basophils Relative: 4 % — ABNORMAL HIGH (ref 0–1)
Eosinophils Relative: 1 % (ref 0–5)
Lymphocytes Relative: 29 % (ref 12–46)
Monocytes Absolute: 1.1 10*3/uL — ABNORMAL HIGH (ref 0.1–1.0)
Monocytes Relative: 16 % — ABNORMAL HIGH (ref 3–12)
Neutrophils Relative %: 50 % (ref 43–77)

## 2011-01-04 LAB — CBC
HCT: 44.9 % (ref 39.0–52.0)
MCV: 82.2 fL (ref 78.0–100.0)
RBC: 5.46 MIL/uL (ref 4.22–5.81)
RDW: 13.5 % (ref 11.5–15.5)
WBC: 7.1 10*3/uL (ref 4.0–10.5)

## 2011-01-04 LAB — BASIC METABOLIC PANEL
Chloride: 108 mEq/L (ref 96–112)
GFR calc non Af Amer: 56 mL/min — ABNORMAL LOW (ref >60)
Glucose, Bld: 130 mg/dL — ABNORMAL HIGH (ref 70–99)
Potassium: 4 mEq/L (ref 3.5–5.1)
Sodium: 140 mEq/L (ref 135–145)

## 2011-01-04 LAB — GLUCOSE, CAPILLARY: Glucose-Capillary: 128 mg/dL — ABNORMAL HIGH (ref 70–99)

## 2011-01-26 ENCOUNTER — Other Ambulatory Visit: Payer: Self-pay | Admitting: Family Medicine

## 2011-01-28 ENCOUNTER — Inpatient Hospital Stay (HOSPITAL_COMMUNITY)
Admission: EM | Admit: 2011-01-28 | Discharge: 2011-02-10 | DRG: 377 | Disposition: A | Discharge status: Home Health | Payer: Medicare Other | Attending: Internal Medicine | Admitting: Internal Medicine

## 2011-01-28 ENCOUNTER — Emergency Department (HOSPITAL_COMMUNITY): Payer: Medicare Other

## 2011-01-28 DIAGNOSIS — E1169 Type 2 diabetes mellitus with other specified complication: Secondary | ICD-10-CM | POA: Diagnosis not present

## 2011-01-28 DIAGNOSIS — D62 Acute posthemorrhagic anemia: Secondary | ICD-10-CM | POA: Diagnosis present

## 2011-01-28 DIAGNOSIS — N170 Acute kidney failure with tubular necrosis: Secondary | ICD-10-CM | POA: Diagnosis present

## 2011-01-28 DIAGNOSIS — I5032 Chronic diastolic (congestive) heart failure: Secondary | ICD-10-CM | POA: Diagnosis present

## 2011-01-28 DIAGNOSIS — K648 Other hemorrhoids: Secondary | ICD-10-CM | POA: Diagnosis present

## 2011-01-28 DIAGNOSIS — Z87891 Personal history of nicotine dependence: Secondary | ICD-10-CM

## 2011-01-28 DIAGNOSIS — M109 Gout, unspecified: Secondary | ICD-10-CM | POA: Diagnosis present

## 2011-01-28 DIAGNOSIS — I2510 Atherosclerotic heart disease of native coronary artery without angina pectoris: Secondary | ICD-10-CM | POA: Diagnosis present

## 2011-01-28 DIAGNOSIS — I252 Old myocardial infarction: Secondary | ICD-10-CM

## 2011-01-28 DIAGNOSIS — N281 Cyst of kidney, acquired: Secondary | ICD-10-CM | POA: Diagnosis present

## 2011-01-28 DIAGNOSIS — N390 Urinary tract infection, site not specified: Secondary | ICD-10-CM | POA: Diagnosis present

## 2011-01-28 DIAGNOSIS — I129 Hypertensive chronic kidney disease with stage 1 through stage 4 chronic kidney disease, or unspecified chronic kidney disease: Secondary | ICD-10-CM | POA: Diagnosis present

## 2011-01-28 DIAGNOSIS — I509 Heart failure, unspecified: Secondary | ICD-10-CM | POA: Diagnosis present

## 2011-01-28 DIAGNOSIS — I2789 Other specified pulmonary heart diseases: Secondary | ICD-10-CM | POA: Diagnosis present

## 2011-01-28 DIAGNOSIS — Z8546 Personal history of malignant neoplasm of prostate: Secondary | ICD-10-CM

## 2011-01-28 DIAGNOSIS — H579 Unspecified disorder of eye and adnexa: Secondary | ICD-10-CM | POA: Diagnosis present

## 2011-01-28 DIAGNOSIS — D126 Benign neoplasm of colon, unspecified: Secondary | ICD-10-CM | POA: Diagnosis present

## 2011-01-28 DIAGNOSIS — K5731 Diverticulosis of large intestine without perforation or abscess with bleeding: Principal | ICD-10-CM | POA: Diagnosis present

## 2011-01-28 DIAGNOSIS — N182 Chronic kidney disease, stage 2 (mild): Secondary | ICD-10-CM | POA: Diagnosis present

## 2011-01-28 DIAGNOSIS — Z794 Long term (current) use of insulin: Secondary | ICD-10-CM

## 2011-01-28 DIAGNOSIS — R809 Proteinuria, unspecified: Secondary | ICD-10-CM | POA: Diagnosis present

## 2011-01-28 LAB — CBC
Hemoglobin: 9.5 g/dL — ABNORMAL LOW (ref 13.0–17.0)
Platelets: 158 10*3/uL (ref 150–400)
RBC: 3.78 MIL/uL — ABNORMAL LOW (ref 4.22–5.81)
WBC: 8.7 10*3/uL (ref 4.0–10.5)

## 2011-01-28 LAB — BASIC METABOLIC PANEL
GFR calc Af Amer: 20 mL/min — ABNORMAL LOW (ref >60)
GFR calc non Af Amer: 16 mL/min — ABNORMAL LOW (ref >60)
Potassium: 4 mEq/L (ref 3.5–5.1)
Sodium: 139 mEq/L (ref 135–145)

## 2011-01-28 LAB — POCT CARDIAC MARKERS
CKMB, poc: 1.8 ng/mL (ref 1.0–8.0)
CKMB, poc: 2.6 ng/mL (ref 1.0–8.0)
Myoglobin, poc: 471 ng/mL (ref 12–200)
Troponin i, poc: <0.05 ng/mL (ref 0.00–0.09)

## 2011-01-28 LAB — DIFFERENTIAL
Basophils Relative: 1 % (ref 0–1)
Eosinophils Absolute: 0.1 10*3/uL (ref 0.0–0.7)
Lymphs Abs: 1.5 10*3/uL (ref 0.7–4.0)
Neutro Abs: 6 10*3/uL (ref 1.7–7.7)
Neutrophils Relative %: 69 % (ref 43–77)

## 2011-01-28 LAB — URINE MICROSCOPIC-ADD ON

## 2011-01-28 LAB — GLUCOSE, CAPILLARY: Glucose-Capillary: 129 mg/dL — ABNORMAL HIGH (ref 70–99)

## 2011-01-28 LAB — URINALYSIS, ROUTINE W REFLEX MICROSCOPIC
Bilirubin Urine: NEGATIVE
Ketones, ur: NEGATIVE mg/dL
Nitrite: NEGATIVE
Protein, ur: >300 mg/dL — AB
Urobilinogen, UA: 0.2 mg/dL (ref 0.0–1.0)
pH: 5.5 (ref 5.0–8.0)

## 2011-01-28 LAB — PRO B NATRIURETIC PEPTIDE: Pro B Natriuretic peptide (BNP): 19853 pg/mL — ABNORMAL HIGH (ref 0–450)

## 2011-01-28 LAB — OCCULT BLOOD, POC DEVICE: Fecal Occult Bld: NEGATIVE

## 2011-01-29 ENCOUNTER — Inpatient Hospital Stay (HOSPITAL_COMMUNITY): Payer: Medicare Other

## 2011-01-29 ENCOUNTER — Other Ambulatory Visit (HOSPITAL_COMMUNITY): Payer: Medicare Other

## 2011-01-29 LAB — CBC
HCT: 29.2 % — ABNORMAL LOW (ref 39.0–52.0)
HCT: 28.6 % — ABNORMAL LOW (ref 39.0–52.0)
HCT: 31.3 % — ABNORMAL LOW (ref 39.0–52.0)
Hemoglobin: 9.3 g/dL — ABNORMAL LOW (ref 13.0–17.0)
MCHC: 32.2 g/dL (ref 30.0–36.0)
MCHC: 32.5 g/dL (ref 30.0–36.0)
MCHC: 31.9 g/dL (ref 30.0–36.0)
MCV: 79.9 fL (ref 78.0–100.0)
MCV: 79.2 fL (ref 78.0–100.0)
Platelets: 154 10*3/uL (ref 150–400)
Platelets: 164 10*3/uL (ref 150–400)
RDW: 14.6 % (ref 11.5–15.5)
RDW: 14.5 % (ref 11.5–15.5)
RDW: 14.5 % (ref 11.5–15.5)
WBC: 7.8 10*3/uL (ref 4.0–10.5)
WBC: 9.1 10*3/uL (ref 4.0–10.5)

## 2011-01-29 LAB — BASIC METABOLIC PANEL
Calcium: 8.4 mg/dL (ref 8.4–10.5)
GFR calc Af Amer: 19 mL/min — ABNORMAL LOW (ref >60)
GFR calc non Af Amer: 16 mL/min — ABNORMAL LOW (ref >60)
Glucose, Bld: 220 mg/dL — ABNORMAL HIGH (ref 70–99)
Potassium: 4.1 mEq/L (ref 3.5–5.1)
Sodium: 142 mEq/L (ref 135–145)

## 2011-01-29 LAB — VITAMIN B12: Vitamin B-12: 896 pg/mL (ref 211–911)

## 2011-01-29 LAB — GLUCOSE, CAPILLARY
Glucose-Capillary: 127 mg/dL — ABNORMAL HIGH (ref 70–99)
Glucose-Capillary: 86 mg/dL (ref 70–99)

## 2011-01-29 LAB — PRO B NATRIURETIC PEPTIDE: Pro B Natriuretic peptide (BNP): 19773 pg/mL — ABNORMAL HIGH (ref 0–450)

## 2011-01-29 LAB — IRON AND TIBC
Iron: 46 ug/dL (ref 42–135)
UIBC: 105 ug/dL

## 2011-01-29 LAB — PROTIME-INR: INR: 1.16 (ref 0.00–1.49)

## 2011-01-29 LAB — APTT: aPTT: 25 seconds (ref 24–37)

## 2011-01-29 LAB — PREPARE RBC (CROSSMATCH)

## 2011-01-29 NOTE — H&P (Signed)
[name], [name] NO.:  [id number]  MEDICAL RECORD NO.:  [id number]           PATIENT TYPE:  I  LOCATION:  4712                         FACILITY:  [facility]  PHYSICIAN:  Andreas Blower, MD       DATE OF BIRTH:  1926-08-19  DATE OF ADMISSION:  01/28/2011 DATE OF DISCHARGE:                             HISTORY & PHYSICAL   PRIMARY CARE PHYSICIAN:  Jasmine December A. Paulino Rily, MD, [facility]  CHIEF COMPLAINT:  Weakness and dysuria.  HISTORY OF PRESENT ILLNESS:  [name] is an 77 year old African American gentleman with history of hypertension, diabetes, gout, coronary artery disease status post non-ST elevation MI in 2004 with history of GI bleed secondary to diverticulosis in 2004 and remote history of prostate cancer who presents with weakness and dysuria.  He reports that he had seen Dr. Mila Palmer recently about a month ago and was started on antihypertensive medication.  The patient had seen the physician at [facility] in [place] on Jan 26, 2011 and had a rectal exam and was told that he did not have any cancer.  The records from the [place] are unavailable at this time.  After going home on Jan 26, 2011, he reports that he has been feeling weak and has had some intermittent diarrhea with black stools.  This morning, on Jan 28, 2011, when he woke up, he had dysuria.  As a result, he presented to the ER for further evaluation.  In the ER, he was noted to have hemoglobin of 9.5 which has dropped from 14.7 in April 2012 and his creatinine was 3.58 which has increased from 1.24 in April 2012.  As a result, the Hospitalist Service was asked to admit the patient and manage him further.  He does report that he felt feverish about a week ago, has not had any fevers recently but does complain about right flank pain.  Denies any chest pain or shortness of breath.  Denies any abdominal pain.  Denies any headaches or vision changes.  REVIEW OF SYSTEMS:  All systems were reviewed  with the patient and was positive as per HPI, otherwise all other systems were negative.  PAST MEDICAL HISTORY: 1. Chronic kidney disease, stage II. 2. Hypertension. 3. Type 2 diabetes. 4. Gout. 5. History of coronary artery disease status post non-ST elevation MI     in 2004. 6. History of GI bleed secondary to diverticulosis. 7. Remote history of prostate cancer.  SOCIAL HISTORY:  The patient does not smoke, does not drink any alcohol. Denies any illegal drugs.  Lives by himself.  FAMILY HISTORY:  Significant for the patient's mother and father dying from old age.  Reports that he has a brother and sister who are healthy, otherwise he does not know what their medical conditions are.  HOME MEDICATIONS:  To be accurately reconciled by pharmacy but he is on: 1. Lisinopril/hydrochlorothiazide 10/12.5 mg p.o. daily which was     substituted for olmesartan/hydrochlorothiazide 40/25 p.o. daily on     Jan 26, 2011. 2. Lorazepam 0.5 mg at bedtime p.r.n. 3. Amitriptyline 25 mg at bedtime. 4. Fexofenadine 60 mg  p.o. twice daily. 5. Colchicine 0.6 mg p.o. daily. 6. Lantus 20 units subcu daily at bedtime. 7. Humalog 5 units subcu with meals.  PHYSICAL EXAMINATION:  VITAL SIGNS:  Temperature is 99.2, blood pressure is 181/90, pulse is 82, respirations 19, and saturating at 96% on room air. GENERAL:  The patient was alert and oriented, not appeared to be in acute distress, and was lying in bed comfortably. HEENT:  Extraocular motions intact.  Pupils equal, round.  Had moist mucous membranes. NECK:  Supple. HEART:  Regular with S1-S2. LUNGS:  Had some scattered crackles at bases, otherwise good respiratory effort. ABDOMEN:  Soft, nontender, and nondistended.  Positive bowel sounds. EXTREMITIES:  The patient had good peripheral pulses except had 1+ edema around the ankles. NEURO:  Cranial nerves II-XII grossly intact.  Had 5/5 motor strength in upper as well as lower  extremities.  IMAGING:  The patient had chest x-ray two-view which shows new bilateral pleural effusions associated with pulmonary vascular congestion.  Acute interstitial edema/CHF.  No other acute cardiopulmonary abnormality seen.  LABORATORY DATA:  CBC shows a white count of 8.7, hemoglobin 9.5, hematocrit 29.8, and platelet count 158.  Electrolytes normal with a BUN of 70, creatinine is 3.58.  BNP is 19,853.  UA was negative for nitrates, had trace leukocytes.  Fecal occult is negative.  ASSESSMENT AND PLAN: 1. Anemia with black stools.  Dr. Ewing Schlein from [facility] has been     consulted and we appreciate his help.  Currently, the patient is     hemodynamically stable.  We will continue to trend CBC.  His fecal     occult is negative but given that he had a precipitous drop in     hemoglobin since last month and he has complained of black stools,     there was concern for possible gastrointestinal source for blood     loss. 2. Generalized weakness, likely due to anemia. 3. Acute renal failure on chronic kidney disease, stage II, may be     due to urinary tract infection.  We will get a renal ultrasound.     Currently on ceftriaxone for urinary tract infection.  Uncertain if     the recent start of olmesartan and lisinopril has caused the acute     renal failure.  We will hold hydrochlorothiazide, lisinopril, and     olmesartan for the time-being.  Continue to trend creatinine. 4. Urinary tract infection.  Start the patient on ceftriaxone.  Follow     urine culture. 5. Hypertension, controlled.  Start amlodipine with p.r.n.     hydralazine. 6. Pleural effusions, may be due to acute renal failure.  BNP is     elevated, however, is unreliable in the setting of acute renal     failure.  Uncertain if the patient has congestive heart failure.     We will get a 2-D echocardiogram.  The patient has not complained     of any chest pain or shortness of breath at this time. 7. Diabetes.   Continue the patient on home regimen with sliding scale     insulin. 8. Gout.  Continue the patient on colchicine, however, decreased     colchicine dose to 0.3 mg to renally dose it. 9. History of gastrointestinal bleed secondary to diverticulosis in     2004 and management as above. 10.History of coronary artery disease, stable.  Not an active issue at     this time. 11.Prophylaxis.  Sequential compression  devices, no heparin given     concern for possible gastrointestinal bleed.  CODE STATUS:  The patient is full code.  This was discussed with the patient at the time of admission.  TIME SPENT:  On admission, talking the patient's family and coordinating care was 1 hour.   Andreas Blower, MD   SR/MEDQ  D:  01/29/2011  T:  01/29/2011  Job:  [id number]  cc:   Emeterio Reeve, MD  Electronically Signed by Wardell Heath Dalilah Curlin  on 01/29/2011 05:32:26 AM

## 2011-01-30 LAB — GLUCOSE, CAPILLARY: Glucose-Capillary: 88 mg/dL (ref 70–99)

## 2011-01-30 LAB — BASIC METABOLIC PANEL
BUN: 64 mg/dL — ABNORMAL HIGH (ref 6–23)
CO2: 18 mEq/L — ABNORMAL LOW (ref 19–32)
Calcium: 8.5 mg/dL (ref 8.4–10.5)
Chloride: 113 mEq/L — ABNORMAL HIGH (ref 96–112)
Creatinine, Ser: 3.21 mg/dL — ABNORMAL HIGH (ref 0.4–1.5)

## 2011-01-30 LAB — CBC
Hemoglobin: 10.5 g/dL — ABNORMAL LOW (ref 13.0–17.0)
MCH: 25.8 pg — ABNORMAL LOW (ref 26.0–34.0)
MCHC: 32.8 g/dL (ref 30.0–36.0)
MCV: 78.6 fL (ref 78.0–100.0)
MCV: 79.1 fL (ref 78.0–100.0)
Platelets: 176 10*3/uL (ref 150–400)
RBC: 4.07 MIL/uL — ABNORMAL LOW (ref 4.22–5.81)
RDW: 14.6 % (ref 11.5–15.5)
WBC: 9 10*3/uL (ref 4.0–10.5)

## 2011-01-30 NOTE — H&P (Signed)
[name], [name]               ACCOUNT NO.:  [id number]   MEDICAL RECORD NO.:  [id number]          PATIENT TYPE:  INP   LOCATION:  0101                         [facility]ACILITY[facility]   PHYSICIAN:  Kela Millin, M.D.DATE OF BIRTH:  [birth year]   DATE OF ADMISSION:  04/02/2009  DATE OF DISCHARGE:                              HISTORY & PHYSICAL   PRIMARY CARE PHYSICIAN:  Dr. Hall Busing   CHIEF COMPLAINT:  Worsening left rib pain.   HISTORY OF PRESENT ILLNESS:  The patient is an 77 year old black male  with a longstanding history of left rib cage pain, uncontrolled diabetes  mellitus, hypertension - on no meds, coronary artery disease with a  history of MI in 2004 - also on no meds, history of GI bleed in the past  as well as a remote history of prostate cancer who presents with the  above complaints.  [name] has had left rib cage pain for several  months now and was admitted in June for similar complaints and workup at  that time included cardiac enzymes and he was ruled out for MI, left rib  x-rays were also done and those were negative.  His pain was controlled  in the hospital and he was subsequently discharged on oral pain  medications and asked to follow-up with his primary care physician for  further evaluation as appropriate.  He saw his primary care physician  early in July - March 17, 2009.  He had a nuclear medicine bone scan given  his remote history of prostate cancer - the bone scan showed no abnormal  rib activity.  It is noted that there was activity in the area of the  sternoclavicular joint, more intense on the left than on the right, and  that this was most likely arthritic.  He states that his pain was mostly  controlled by the pain medications he was discharged on, but he ran out  and was not able to get a refill.  Over the past few days, his pain has  worsened and got so severe last night that his family brought him to the  ED.  In the ED, a CT scan of  his abdomen and pelvis was done and it  shows that his pancreas is normal.  No left-sided hydronephrosis or  hydroureter reported.  A hyperdensity in the neck of the gallbladder -  suspicious for stone was reported.  The patient has no right-sided pain  and no nausea or vomiting.  Also, no abdominal pain.  In the ED, his lab  work was done and was found to have a glucose of 689, lipase was done  within normal limits at 45 and his CO2 within normal limits at 23.  He  was started on IV insulin via glucose stabilizer and his sugars improved  - last blood glucose 156.  He was also found to have a temperature of  100.4 in the ED and a urinalysis was done which was negative for  infection.  Chest/abdominal series showed no acute infiltrates or  pleural effusion and nonobstructive bowel gas pattern.  He was noted to  be guaiac positive.  He denies diarrhea.  The patient denies cough,  nausea, vomiting, dysuria, and no melena.  He is admitted for further  evaluation and management.   PAST MEDICAL HISTORY.:  1. As above.  2. History of arthritis.  3. History of GI bleed in 2004.  4. History of hyperlipidemia.   MEDICATIONS:  1. Lantus 14 units q. a.m.  2. Colchicine 0.6 mg b.i.d.  3. Aspirin.   ALLERGIES:  MOTRIN.   SOCIAL HISTORY:  Remote history of tobacco use, he denies alcohol.   FAMILY HISTORY:  Positive for diabetes mellitus.   REVIEW OF SYSTEMS:  As per HPI, other review of systems negative.   PHYSICAL EXAM:  GENERAL:  The patient is an elderly black male, he is  alert and appropriate in no respiratory distress.  VITAL SIGNS: His temperature is 97.9, initially 100.4, blood pressure of  115/75, pulse of 71, respiratory rate of 16, O2 SAT of 99%.  HEENT:  PERRL, EOMI, slightly dry mucous membranes, no oral exudates.  NECK:  Supple, no adenopathy, no thyromegaly and no JVD.  CARDIOVASCULAR:  Regular rate and rhythm.  Normal S1-S2.  LUNGS/CHEST:  Clear to auscultation bilaterally.   No crackles or  wheezes, he is tender over his lower ribs on the left side, no sternal  tenderness - his tenderness goes from right around the midclavicular  line across to about the mid axillary line on his left side and again  just over the lower ribs.  ABDOMEN:  Soft, bowel sounds present, nontender, nondistended.  No  organomegaly and no masses palpable.  EXTREMITIES:  No cyanosis and no edema.  NEURO:  [name] is alert and oriented x3.  Cranial nerves II-XII grossly  intact.  Nonfocal exam.   LABORATORY DATA:  As per HPI.  Also, his white cell count is 9 with a  hemoglobin of 13.6, hematocrit of 42.7, platelet count of 256,  neutrophil count of 69.  His sodium is 128, potassium of 5.1, chloride  of 97, CO2 of 23.  Initial glucose of 689 (256 after IV glucose  stabilizer insulin).  BUN is 21, creatinine 1.37, calcium 9.8.  His serum acetone is negative.   ASSESSMENT AND PLAN:  #1.  Left lower rib cage pain - as discussed  above, unclear etiology.  Workup to date as outline in the H&P.  I also  reviewed the CT scan of abdomen and pelvis with the radiologist and the  lower costochondral joints were seen and also the ribs from the fourth  one down were examined and no abnormalities noted.  As noted in the HPI,  the bone scan done March 17, 2009 shows sternoclavicular joint arthritic  changes.  The patient is not tender over his lower sternal area and is  not complaining of any pain around the sternoclavicular joint area.  We  will admit for pain control, we will also consult Palliative Care to  assist with long term pain management.  #2.  Hyperglycemia/uncontrolled diabetes mellitus - as discussed above.  A history of this has improved on the glucose stabilizer, we will  transition to Lantus with sliding scale coverage and follow.  #3.  Fever - unclear etiology, urinalysis and chest x-ray as above.  We  will obtain blood cultures, repeat chest x-ray in the morning and  monitor and  further evaluate as appropriate.  #4.  History of hypertension - on no medications, monitor blood  pressures and treat accordingly.  #  5.  Guaiac positive stool - no active or gross bleeding noted, and his  hemoglobin is stable.  We will monitor CBCs and if remaining stable,  follow up as outpatient. Will avoid NSAIDS for now given his guaiac  positive stool and history of gastrointestinal bleed.      Kela Millin, M.D.  Electronically Signed     ACV/MEDQ  D:  04/03/2009  T:  04/03/2009  Job:  [id number]   cc:   Otilio Connors. Gerri Spore, M.D.  Fax: [phone]   Emeterio Reeve, MD  Fax: [phone]

## 2011-01-30 NOTE — H&P (Signed)
NAME:  [name], [name] NO.:  [id number]   MEDICAL RECORD NO.:  [id number]          PATIENT TYPE:  EMS   LOCATION:  MAJO                         FACILITY:  [facility]   PHYSICIAN:  Michiel Cowboy, MDDATE OF BIRTH:  1925/11/20   DATE OF ADMISSION:  02/24/2009  DATE OF DISCHARGE:                              HISTORY & PHYSICAL   PRIMARY CARE Demaryius Imran:  Emeterio Reeve, M.D.   CHIEF COMPLAINT:  Chest pain.   Patient is an 77 year old gentleman with a history of diabetes,  hyperlipidemia, hypertension, and coronary artery disease.  Patient  presents with complaint of left-sided chest pain, more underneath his  arm pits rather than true chest pain.  It has been there for months but  is progressively getting worse.  He had been worked up for this in the  past with multiple chest x-rays, all of them not showing anything which  would explain his pain.  No pneumonia.  The patient has not had any  fevers or chills.  He occasionally had some cough but otherwise  unremarkable.  No diaphoresis.  No nausea or vomiting.  For the past few  days, he had fairly severe pain, and that is what brought him into the  emergency department.  When asked, he is not having any chest pain in  the front instead it is the side and the lower part of his ribsthat  hurts and is clearly very reproducible, per patient.   REVIEW OF SYSTEMS:  Unremarkable.   PAST MEDICAL HISTORY:  1. Hypertension.  2. Diabetes.  3. Coronary artery disease, status post non-STIMI in 2004 and status      post catheterization.  4. History of GI bleed.  __________ in 2004.  5. Remote history of prostate cancer.   SOCIAL HISTORY:  Patient used to smoke but quit a long time ago.  He  does not drink.  He does not abuse drugs.  He lives at home with his  son.   FAMILY HISTORY:  Significant for diabetes.   ALLERGIES:  Allergic to MOTRIN.   MEDICATIONS:  He is currently only taking colchicine 0.6 mg twice daily  and what sounds like 10 units of possibly Lantus in the morning.  He  does not think he takes any other medicines.   PHYSICAL EXAMINATION:  VITALS:  Temperature 99.4, blood pressure 138/84,  pulse 76, respirations 18, satting 99% on room air.  Patient appears to be in no acute distress.  Head nontraumatic.  Moist mucous membranes.  LUNGS:  Clear to auscultation bilaterally.  HEART:  Regular rate and rhythm.  No murmurs, rubs or gallops.  ABDOMEN:  Soft, nontender, nondistended.  CHEST:  Significant intracostal tenderness on the left.  LOWER EXTREMITIES:  No clubbing, cyanosis or edema.  NEUROLOGIC:  Patient appears to be grossly intact, alert and oriented.   LABS:  White blood cell count 7.3, hemoglobin 13.4.  Sodium 133,  potassium 4.6, creatinine 1.1.  Cardiac enzymes negative.   Chest x-ray shows mild cardiac enlargement with left basilar scar, but  otherwise no pneumonia, no other findings.   EKG  shows no ischemic changes with a normal sinus rhythm.  Heart rate is  77.   ASSESSMENT/PLAN:  1. This is an 77 year old gentleman with no history of coronary artery      disease with very atypical chest pain.  The chest pain is most      likely musculoskeletal in origin.  We will start on tramadol.      Patient had a gastrointestinal bleed in the past.  That is possibly      why he is not on any NSAIDs.  We will avoid those for now.  Given      the history of coronary artery disease, will cycle cardiac markers,      but I would be surprised if they are positive.  2. Serial EKGs.  3. History of hyperlipidemia.  Apparently not on any new medicines      now.  Will check a fasting lipid panel.  4. Hypertension:  Will write for hydralazine p.r.n. but apparently not      taking anything currently, according to the patient.  If blood      pressure noted to be persistently elevated, recommend ACE inhibitor      perhaps or ARB, since he had been having some cough.  5. History of coronary  artery disease:  Patient not on statin, not on      aspirin.  Not on ACE.  I am not sure if there is a reason for this      or if it is for compliance, as patient does not remember what he is      on.  Would need to confirm this with his primary care Issa Kosmicki.      For now, will give baby aspirin, as patient did have a GI bleed,      but this is very remote.  We will make sure he is on Protonix.  6. Diabetes:  Will do hemoglobin A1C.  Lantus.  Sliding scale.  7. Prophylaxis:  Protonix plus Lovenox.  8. A slightly low sodium.  Treat his IV fluids to see if it improves.      Michiel Cowboy, MD  Electronically Signed     AVD/MEDQ  D:  02/25/2009  T:  02/25/2009  Job:  [id number]   cc:   Emeterio Reeve, MD

## 2011-01-30 NOTE — Discharge Summary (Signed)
[name], [name]               ACCOUNT NO.:  [id number]   MEDICAL RECORD NO.:  [id number]          PATIENT TYPE:  INP   LOCATION:  1311                         [facility]ACILITY[facility]   PHYSICIAN:  [name], MDDATE OF BIRTH:  [birth year]   DATE OF ADMISSION:  04/03/2009  DATE OF DISCHARGE:  04/07/2009                               DISCHARGE SUMMARY   DISCHARGE DIAGNOSES:  1. Uncontrolled diabetes.  2. Left-sided chest pain.  Suspect radiculopathy/neuropathic pain.  3. Hypertension.  4. Heme-positive stool.  5. History of prostate cancer.  6. Hyponatremia.  7. Resolved fever.  No infectious etiology found.   DISCHARGE MEDICATIONS:  1. Amaryl 4 mg a day.  2. Increase Lantus to 20 units subcutaneously nightly.  3. Lyrica 75 mg twice a day.  4. Elavil 25 mg nightly.  5. Tylenol as needed for pain.  6. Ultram 50 mg every 6 hours as needed for severe pain.  7. Continue aspirin 81 mg a day.  8. Colchicine 0.6 mg p.o. b.i.d.   CONDITION:  Stable.   ACTIVITY:  Ad lib.   DIET:  Diabetic.   Follow up with Dr. Paulino Perkins in 2-4 weeks.  Home physical therapy has been  arranged.   CONSULTATIONS:  None.   PROCEDURES:  None.   LABORATORIES:  CBC unremarkable.  D-dimer is 1.38.  Hemoccult of the  stool positive.  Basic metabolic panel on admission significant for a  sodium of 128, glucose 689, normal bicarbonate.  At discharge his sodium  is 135, glucose 226.  Liver function tests on admission unremarkable.  Normal lipase.  Serum acetone negative.  Urinalysis showed a specific  gravity of 1.035, greater than 1000 glucose; otherwise, negative.  Blood  cultures negative. Point of care enzymes negative.   SPECIAL STUDIES:  Radiology:  EKG showed normal sinus rhythm.  Acute  abdominal series showed nonobstructive bowel gas pattern.  No acute  chest findings.  CT of the abdomen and pelvis showed hyperdensity near  the neck of the gallbladder suspicious for nonobstructing right  renal  stone.  Repeat chest x-ray on the 19th showed improved left basilar  airspace opacity. MRI of the T-spine showed no evidence of metastatic  disease to the thoracic spine, mild disk disease at multiple levels  without significant stenosis, potentially significant cervical  spondylosis.   HISTORY AND HOSPITAL COURSE:  [name] is an 77 year old black male  with a history of diabetes and several month history of left-sided chest  pain.  He was admitted in June for the chest pain which was felt to be  musculoskeletal in origin.  He also had an outpatient bone scan which  ruled out any metastatic disease to bone.  He presented on July 17 with  severe unrelenting left-sided rib cage pain.  Please see H and P for  details.  He had soft, nondistended, nontender abdomen and had some left-  sided lower rib cage pain.  He was tried on multiple pain medications.   He also was found to have very uncontrolled diabetes, and was therefore  admitted to the Hospitalist Service for the  uncontrolled diabetes.  His  medications were adjusted, and his blood sugars were ranging in the 100-  200 range at the time of discharge.  His Lantus was increased, and he  was started on Amaryl.  He was also given sliding scale insulin while  here.   His pain was severe and very disconcerting to him.  Nothing seemed to  relieve the pain, and there was no specific etiology found.  I was  concerned about a radiculopathy, and therefore ordered an MRI scan which  did not show anything in particular to cause his pain.  Nevertheless,  Lyrica and Elavil were started for neuropathic pain.  This seemed to  help more than anything, and patient was able to get a good night's rest  and his pain improved tremendously.  He was able to be discharged with  home physical therapy.  He will need follow-up with Dr. Paulino Perkins in 2-4  weeks.   Total time on the day of discharge is 40 minutes.      Jeff L. Lendell Caprice, MD   Electronically Signed     CLS/MEDQ  D:  04/28/2009  T:  04/28/2009  Job:  [id number]

## 2011-01-31 ENCOUNTER — Inpatient Hospital Stay (HOSPITAL_COMMUNITY): Payer: Medicare Other

## 2011-01-31 LAB — CBC
MCH: 25.3 pg — ABNORMAL LOW (ref 26.0–34.0)
Platelets: 202 10*3/uL (ref 150–400)
RBC: 4.23 MIL/uL (ref 4.22–5.81)
WBC: 10.9 10*3/uL — ABNORMAL HIGH (ref 4.0–10.5)

## 2011-01-31 LAB — COMPREHENSIVE METABOLIC PANEL
AST: 20 U/L (ref 0–37)
Albumin: 2.2 g/dL — ABNORMAL LOW (ref 3.5–5.2)
Calcium: 8.6 mg/dL (ref 8.4–10.5)
Chloride: 111 mEq/L (ref 96–112)
Creatinine, Ser: 3.57 mg/dL — ABNORMAL HIGH (ref 0.4–1.5)
GFR calc Af Amer: 20 mL/min — ABNORMAL LOW (ref >60)
Total Bilirubin: 0.3 mg/dL (ref 0.3–1.2)

## 2011-01-31 LAB — URINE CULTURE
Colony Count: NO GROWTH
Culture  Setup Time: 201205150735

## 2011-01-31 LAB — ANA: Anti Nuclear Antibody(ANA): NEGATIVE

## 2011-01-31 LAB — SEDIMENTATION RATE: Sed Rate: 60 mm/hr — ABNORMAL HIGH (ref 0–16)

## 2011-01-31 LAB — GLUCOSE, CAPILLARY
Glucose-Capillary: 167 mg/dL — ABNORMAL HIGH (ref 70–99)
Glucose-Capillary: 212 mg/dL — ABNORMAL HIGH (ref 70–99)

## 2011-01-31 NOTE — Op Note (Signed)
  [name], [name]               ACCOUNT NO.:  [id number]  MEDICAL RECORD NO.:  [id number]           PATIENT TYPE:  I  LOCATION:  4712                         FACILITY:  [facility]  PHYSICIAN:  Akshita Italiano C. Madilyn Fireman, M.D.    DATE OF BIRTH:  [birth year]  DATE OF PROCEDURE:  01/30/2011 DATE OF DISCHARGE:                              OPERATIVE REPORT   PROCEDURE:  Esophagogastroduodenoscopy.  SURGEON:  Myrtle Barnhard C. Madilyn Fireman, MD  REASON FOR CONSULTATION:  Anemia and dark stools.  PROCEDURE IN DETAIL:  The patient was placed in the left lateral decubitus position and placed on the pulse monitor with continuous low- flow oxygen delivered by nasal cannula.  He was sedated with 50 mcg IV fentanyl and 3 mg IV Versed.  The Olympus video endoscope was advanced under direct vision into the oropharynx and esophagus.  The esophagus was straight and of normal caliber with squamocolumnar line at 38 cm. There was no visible hiatal hernia, ring stricture, or other abnormality of the GE junction.  The stomach was entered and a small amount of liquid secretions were suctioned from the fundus.  Retroflexed view of the cardia was unremarkable.  The fundus, body, antrum, and pylorus all appeared normal.  The duodenum was entered.  Both bulb and second portion were inspected and appeared to be within normal limits.  The scope was then withdrawn and the patient returned to the recovery room in stable condition.  He tolerated the procedure well.  There were no immediate complications.  IMPRESSION:  Normal endoscopy.  PLAN:  Review records and consider colonoscopy which was last done in 2004.          ______________________________ Everardo All. Madilyn Fireman, M.D.     JCH/MEDQ  D:  01/30/2011  T:  01/30/2011  Job:  [id number]  Electronically Signed by Dorena Cookey M.D. on 01/31/2011 08:57:02 PM

## 2011-02-01 ENCOUNTER — Inpatient Hospital Stay (HOSPITAL_COMMUNITY): Payer: Medicare Other

## 2011-02-01 ENCOUNTER — Other Ambulatory Visit: Payer: Self-pay | Admitting: Gastroenterology

## 2011-02-01 LAB — URINALYSIS, ROUTINE W REFLEX MICROSCOPIC
Glucose, UA: NEGATIVE mg/dL
Ketones, ur: NEGATIVE mg/dL
Leukocytes, UA: NEGATIVE
Specific Gravity, Urine: 1.021 (ref 1.005–1.030)
pH: 5 (ref 5.0–8.0)

## 2011-02-01 LAB — BASIC METABOLIC PANEL
CO2: 17 mEq/L — ABNORMAL LOW (ref 19–32)
Calcium: 9 mg/dL (ref 8.4–10.5)
Creatinine, Ser: 4.1 mg/dL — ABNORMAL HIGH (ref 0.4–1.5)
Glucose, Bld: 63 mg/dL — ABNORMAL LOW (ref 70–99)

## 2011-02-01 LAB — GLUCOSE, CAPILLARY
Glucose-Capillary: 121 mg/dL — ABNORMAL HIGH (ref 70–99)
Glucose-Capillary: 57 mg/dL — ABNORMAL LOW (ref 70–99)
Glucose-Capillary: 65 mg/dL — ABNORMAL LOW (ref 70–99)
Glucose-Capillary: 80 mg/dL (ref 70–99)

## 2011-02-01 LAB — CBC
MCH: 25.7 pg — ABNORMAL LOW (ref 26.0–34.0)
MCV: 80.7 fL (ref 78.0–100.0)
Platelets: 242 10*3/uL (ref 150–400)
RDW: 15.5 % (ref 11.5–15.5)

## 2011-02-01 LAB — TYPE AND SCREEN: Unit division: 0

## 2011-02-01 LAB — URINE MICROSCOPIC-ADD ON

## 2011-02-01 LAB — TSH: TSH: 5.115 u[IU]/mL — ABNORMAL HIGH (ref 0.350–4.500)

## 2011-02-02 ENCOUNTER — Other Ambulatory Visit: Payer: Self-pay | Admitting: Internal Medicine

## 2011-02-02 LAB — GLUCOSE, CAPILLARY
Glucose-Capillary: 132 mg/dL — ABNORMAL HIGH (ref 70–99)
Glucose-Capillary: 110 mg/dL — ABNORMAL HIGH (ref 70–99)
Glucose-Capillary: 93 mg/dL (ref 70–99)
Glucose-Capillary: 131 mg/dL — ABNORMAL HIGH (ref 70–99)
Glucose-Capillary: 182 mg/dL — ABNORMAL HIGH (ref 70–99)
Glucose-Capillary: 117 mg/dL — ABNORMAL HIGH (ref 70–99)

## 2011-02-02 LAB — CBC
MCH: 26.2 pg (ref 26.0–34.0)
MCHC: 32.8 g/dL (ref 30.0–36.0)
RDW: 15.7 % — ABNORMAL HIGH (ref 11.5–15.5)

## 2011-02-02 LAB — COMPREHENSIVE METABOLIC PANEL
ALT: 17 U/L (ref 0–53)
AST: 24 U/L (ref 0–37)
Calcium: 8.4 mg/dL (ref 8.4–10.5)
Creatinine, Ser: 4.45 mg/dL — ABNORMAL HIGH (ref 0.4–1.5)
GFR calc Af Amer: 15 mL/min — ABNORMAL LOW (ref >60)
GFR calc non Af Amer: 13 mL/min — ABNORMAL LOW (ref >60)
Sodium: 140 mEq/L (ref 135–145)
Total Protein: 5.8 g/dL — ABNORMAL LOW (ref 6.0–8.3)

## 2011-02-02 NOTE — Cardiovascular Report (Signed)
   NAME:  [name], BOENING                         ACCOUNT NO.:  [id number]   MEDICAL RECORD NO.:  [id number]                   PATIENT TYPE:  INP   LOCATION:  5531                                 FACILITY:  [facility]   PHYSICIAN:  Ricki Rodriguez, M.D.               DATE OF BIRTH:  Aug 21, 1926   DATE OF PROCEDURE:  09/28/2002  DATE OF DISCHARGE:                              CARDIAC CATHETERIZATION   PROCEDURE:  Left heart catheterization with selective coronary angiography,  left ventricular function study.   CARDIOLOGIST:  Ricki Rodriguez, M.D.   INDICATIONS FOR PROCEDURE:  This 77 year old black male had symptoms of  angina with nonspecific electrocardiogram changes; however had abnormal  cardiac enzymes.   APPROACH:  Right femoral artery using 6-French diagnostic catheters.   COMPLICATIONS:  None.  Perclose closure applied without any difficulty.   HEMODYNAMIC DATA:  Left ventricular pressure:  132/10 mmHg.  Aortic pressure:  115/57 mmHg, with 17 mm of peak gradient across the aortic  valve.   CORONARY ANATOMY:  1. Left main coronary artery:  The left main coronary artery was     unremarkable.  2. Left anterior descending coronary artery:  The left anterior descending     coronary artery was also unremarkable.  The diagonal branch I had a     proximal 70% eccentric lesion.  3. Left circumflex coronary artery:  The left circumflex coronary artery was     dominant and was essentially unremarkable.  4. Posterior descending coronary artery:  The posterior descending coronary     artery and the posterior branches were also unremarkable.  Had an     eccentric 50% middle vessel disease of the ramus branch, and obtuse     marginal branch I & II were very small vessels.  5. Right coronary artery:  The right coronary artery was a small vessel with     a proximal 100% occlusion.  Had small collateral vessels and a smaller     distal vessel.  LEFT VENTRICULOGRAM:  The left ventriculogram  showed minimal inferior wall  hypokinesia with an ejection fraction of 60%.   IMPRESSION:  1. Acute non-Q-wave myocardial infarction.  2. Significant right coronary artery disease.  3. Mild to moderate new two-vessel coronary artery disease.  4.     Preserved left ventricular systolic function.  5. Small gradient across the aortic valve.   PLAN:  Medical treatment.  The case was reviewed with Dr. Eduardo Osier. Harwani.                                                 Ricki Rodriguez, M.D.    ASK/MEDQ  D:  09/28/2002  T:  09/28/2002  Job:  [id number]

## 2011-02-02 NOTE — Op Note (Signed)
NAME:  [name], [name]                         ACCOUNT NO.:  [id number]   MEDICAL RECORD NO.:  [id number]                   PATIENT TYPE:  INP   LOCATION:  3313                                 FACILITY:  [facility]   PHYSICIAN:  Graylin Shiver, M.D.                DATE OF BIRTH:  06/02/1926   DATE OF PROCEDURE:  09/06/2003  DATE OF DISCHARGE:                                 OPERATIVE REPORT   PROCEDURE PERFORMED:  Colonoscopy.   INDICATIONS FOR PROCEDURE:  Lower gastrointestinal bleeding.   PREMEDICATIONS:  Fentanyl 60 mcg  IV, Versed 6 mg IV.   Informed consent was obtained after the explanation of the risks of  bleeding, infection, perforation.   DESCRIPTION OF PROCEDURE:  With the patient in the left lateral decubitus  position, a rectal exam was performed and no masses were felt.  The Olympus  pediatric colonoscope was inserted into the rectum and advanced around the  colon to the cecum.  Cecal landmarks were identified.  The cecum looked  normal.  There were some scattered diverticula noted in the ascending colon  and transverse colon.  There was moderate to marked diverticulosis noted in  the descending colon and sigmoid colon.  Between 25 and 40 cm there was some  fresh-appearing blood noted.  This was suctioned and the entire area was  washed and no further blood could be seen accumulating in the area.  There  were numerous diverticula in the area and I suspect that one of these had  been oozing; however, after washing and carefully inspecting this entire  area, I could see a specific diverticulum that was actively bleeding or had  any specific stigmata of bleeding.  The rectum looked normal.  The patient  tolerated the procedure well without complications.   IMPRESSION:  Diverticulosis of the colon, most extensive in the left colon.  There was some blood noted between 25 and 40 cm which was washed and the  area was cleared with no further active bleeding.   I think this  patient's lower gastrointestinal bleeding is obviously of a  diverticular origin.  I would recommend continued observation and  transfusion as necessary.                                               Graylin Shiver, M.D.    [name]  D:  09/06/2003  T:  09/07/2003  Job:  [id number]   cc:   Sherin Quarry, MD

## 2011-02-02 NOTE — Consult Note (Signed)
NAME:  [name], [name]                         ACCOUNT NO.:  [id number]   MEDICAL RECORD NO.:  [id number]                   PATIENT TYPE:  INP   LOCATION:  3313                                 FACILITY:  [facility]   PHYSICIAN:  Graylin Shiver, M.D.                DATE OF BIRTH:  [birth year]   DATE OF CONSULTATION:  09/05/2003  DATE OF DISCHARGE:                                   CONSULTATION   REFERRING PHYSICIAN:  Sherin Quarry, MD   REASON FOR CONSULTATION:  The patient is a 77 year old black male who  presented to the emergency room last night with complaints of a lot of  rectal bleeding. He came to the emergency room where he was evaluated and  subsequently admitted. He had noticed a little bit of intermittent bleeding  over the past week, but not a great deal. He does give a history of  diverticulosis and colon polyps. He had a colonoscopy about five years ago  at the [facility]. He was admitted to ICU last night and today seems quite  stable. The bleeding has slowed down dramatically. He is feeling better. He  denied any vomiting of blood. His hemoglobin and hematocrit on admission  were 10.6 and 32.1.   PAST MEDICAL HISTORY:  Diabetes, hypertension, coronary artery disease with  a history of an MI in the past, gout.   PAST SURGICAL HISTORY:  None.   ALLERGIES:  No known drug allergies.   MEDICATIONS:  1. Glipizide.  2. Metformin.  3. Prinivil.  4. Zocor.  5. Aspirin.  6. Colchicine.   SOCIAL HISTORY:  Does not smoke or drink alcohol.   FAMILY HISTORY:  Negative for colon cancer.   REVIEW OF SYSTEMS:  Denies fever or chills. Denies chest pain or shortness  of breath.   PHYSICAL EXAMINATION:  VITAL SIGNS:  Blood pressure 115/55, pulse 82.  GENERAL:  He is in no acute distress.  HEENT:  Nonicteric.  NECK:  Supple.  HEART:  Regular rhythm. No murmurs.  ABDOMEN:  Soft. Bowel sounds are normal. Nontender. There is no  hepatosplenomegaly.   IMPRESSION:  Lower GI bleed,  probably diverticular in origin.   PLAN:  Supportive care. Follow H&H. Transfuse as needed. The patient will be  scheduled for a colonoscopy.                                               Graylin Shiver, M.D.    [name]  D:  09/05/2003  T:  09/06/2003  Job:  [id number]

## 2011-02-02 NOTE — H&P (Signed)
NAME:  [name], [name]                         ACCOUNT NO.:  [id number]   MEDICAL RECORD NO.:  [id number]                   PATIENT TYPE:  INP   LOCATION:  3313                                 FACILITY:  [facility]   PHYSICIAN:  Georgann Housekeeper, M.D.                 DATE OF BIRTH:  [birth year]   DATE OF ADMISSION:  09/04/2003  DATE OF DISCHARGE:                                HISTORY & PHYSICAL   PRIMARY CARE PHYSICIAN:  In [place].   CHIEF COMPLAINT:  Blood from rectum.   HISTORY OF PRESENT ILLNESS:  A 77 year old male with one-week history of  intermittent blood from rectum.  One week ago, he went to the [facility].  He was told that he had a little constipation and was told that  he might have internal hemorrhoids and was given some suppositories.  He  also was given some stool softener and was sent home.  Prior to this  procedure, he had a rectal exam by his urologist, Dr. Retta Diones, for routine  prostate cancer, but he did not have any procedure.  Then, as far as today,  the bleeding from the rectum was worse.  He had fresh blood coming out,  cupfuls at a time, 2-3 times, then he felt a little dizzy.  He came to the  emergency room.  He had two episodes, more of fresh rectal bleeding.  He  denies any nausea or vomiting, although some lower abdominal pain and  cramping.  No fevers.  He had some dizziness when he was getting up and  standing.  No chest pain.  No shortness of breath.  He reportedly had a  colonoscopy 4-5 years ago while in [place].  By his history, maybe he had  colonic polyps and some diverticulosis, but he is unsure.  He has not had  any bleeding in the past.  He denies any stomach ulcers.  He denies any  nonsteroidal use.  No vomiting.  No urinary symptoms.   PAST MEDICAL HISTORY:  1. Prostate cancer, status post radiation.  Dr. Retta Diones is his urologist.  2. Type 2 diabetes.  On oral agents.  3. Hypertension.  4. Coronary artery disease, questionable  MI.  5. Gout.   ALLERGIES:  None.   MEDICATIONS:  1. Glipizide 10 mg q.d.  2. Metformin 500 b.i.d.  3. Prinivil 40 mg q.d.  4. Zocor 40 mg q.d.  5. Aspirin 325 mg q.d.  6. Colchicine 0.6 q.d.   PAST SURGICAL HISTORY:  None.   SOCIAL HISTORY:  No tobacco.  No alcohol.  No recreational drugs.  Married.  One son.  He goes to the [place], [place], for regular care.  The only doctor in  [place] is Dr. Retta Diones.   PHYSICAL EXAMINATION:  VITAL SIGNS:  Temperature 97.6, blood pressure  122/65, pulse 100-110, sinus tachy.  Oxygen saturation 99% on room air.  GENERAL:  Awake and alert in no acute distress.  Complaining of some  dizziness when he was sitting up.  LUNGS:  Clear.  CARDIAC:  Regular S1 and S2 without any murmur.  ABDOMEN:  Soft.  Mildly tender in the lower quadrants.  Bowel sounds.  No  rebound or guarding.  RECTAL:  Fresh blood.   LABS:  Hemoglobin 12.1, white count 6.6, platelets 282.  Chemistries:  Sodium 139, potassium 3.6, BUN 10, creatinine 1.1, glucose 121.  LFTs are  normal.   IMPRESSION:  25. A 77 year old gentleman with acute hematochezia with some dizziness.     Hemodynamically stable.  No fever.  Some tenderness in the lower     quadrant.  2. Lower gastrointestinal bleed, most likely source diverticular.  Rule out     ischemic colitis.  Plan:  Admit to telemetry.  N.P.O.  IV fluids.     Transfuse for hemoglobin of less than 10.  Follow serial CBCs,     antibiotics, Cipro and Flagyl prophylactically for diverticulitis.  GI     consult for colonoscopy with Dr. Evette Cristal.  I discussed with him, as far as     he will see the patient in the morning.  3. Hypertension/coronary artery disease:  Stable.  Controlled on blood     pressure medications for now.  4. Diabetes:  Sliding scale insulin:  Hold oral agents.                                                Georgann Housekeeper, M.D.    KH/MEDQ  D:  09/04/2003  T:  09/05/2003  Job:  [id number]

## 2011-02-02 NOTE — Discharge Summary (Signed)
NAME:  [name], [name]                         ACCOUNT NO.:  [id number]   MEDICAL RECORD NO.:  [id number]                   PATIENT TYPE:  INP   LOCATION:  3313                                 FACILITY:  [facility]   PHYSICIAN:  Deirdre Peer. Polite, M.D.              DATE OF BIRTH:  04-28-1926   DATE OF ADMISSION:  09/04/2003  DATE OF DISCHARGE:  09/08/2003                                 DISCHARGE SUMMARY   DISCHARGE DIAGNOSES:  1. Lower gastrointestinal bleed secondary to diverticulosis.  2. Hypertension.  3. Diabetes.  4. High cholesterol.   DISCHARGE MEDICATIONS:  1. Glipizide 10 mg daily.  2. Metformin 500 mg b.i.d.  3. Prinivil 40 mg daily.  4. Zocor 40 mg daily.   The patient is asked not to take aspirin x 2 weeks secondary to recent lower  GI bleed.   CONSULTANTS:  Dr. Doreatha Martin ___________   STUDIES:  1. The patient had a bleeding scan which was negative for active bleeding.  2. The patient had a colonoscopy on December 20 which showed extensive     diverticulosis of the sigmoid and descending colon.  There was some blood     noted between 25 and 40 cm; however, no active bleeding was noted after     washing.  Scattered diverticulosis in the right colon.  3. Hemoglobin on admission 12.1.  Hemoglobin nadir 8.2.  Post 3 units packed     red blood cells hemoglobin 12.7.  Hemoglobin at discharge 13.3.  4. BMET  within normal limits.   DISPOSITION:  The patient is being discharged to home in stable condition.   HISTORY OF PRESENT ILLNESS:  A 77 year old black male with above medical  problems presented to the ED with complaints of moderate rectal bleeding.  In the ED, the patient was found to have red blood per rectum.  Hemoglobin  was 10.6.  Admission was deemed necessary for further evaluation and  treatment.  Please see dictated HPI for further details of History of  Present Illness.   HOSPITAL COURSE:  The patient was admitted to medicine floor bed for  evaluation and  treatment of lower GI bleed.  The patient was n.p.o.  Consultation was obtained by [facility].  The patient underwent colonoscopy on  December 20 with findings as stated above.  Post colonoscopy, the patient  stated he noticed several loose stools the day after that as well as the  following morning.  Because of concerns of possible continued bleeding and  no active site at colonoscopy, a bleeding scan was ordered.  The patient's  nuclear medicine bleeding scan showed no source of bleeding.  Consequently,  the patient did not have any other loose bloody stools.  The patient's diet  was advanced, and he was deemed appropriate for discharge by myself as well  as GI.   Of note, the patient had been put on empiric antibiotics upon admission  because of concern for possible diverticulitis because he had a mild  leukocytosis.  However, there have been no signs of infection throughout  this hospitalization, and it has been decided not to discharge the patient  on antibiotics.  It is felt the cause for his discomfort is all secondary to  diverticulosis.  At this time, the patient does not have a primary MD.  I  have given him recommendations to follow up with his wife's primary MD whom  he will call to try to arrange an appointment.   The patient has prescriptions for all of his home medications which included  Glipizide, metformin, Prinivil, Zocor, and aspirin.  He has been instructed  not to take aspirin for approximately two weeks secondary to his GI bleed.                                                Deirdre Peer. Polite, M.D.    RDP/MEDQ  D:  09/08/2003  T:  09/10/2003  Job:  [id number]

## 2011-02-02 NOTE — Discharge Summary (Signed)
NAME:  [name], [name]                         ACCOUNT NO.:  [id number]   MEDICAL RECORD NO.:  [id number]                   PATIENT TYPE:  INP   LOCATION:  5531                                 FACILITY:  [facility]   PHYSICIAN:  Ricki Rodriguez, M.D.               DATE OF BIRTH:  June 15, 1926   DATE OF ADMISSION:  09/27/2002  DATE OF DISCHARGE:  10/04/2002                                 DISCHARGE SUMMARY   PRINCIPAL DIAGNOSES:  1. Bronchitis.  2. Non-Q wave myocardial infarction.  3. Two vessel coronary artery disease.  4. Minimal aortic stenosis.  5. Diabetes mellitus.  6. Hypertension.  7. Hyperlipidemia.  8. Prostate cancer.   DISCHARGE MEDICATIONS:  1. Glyburide 10 mg daily.  2. Protonix 40 mg daily.  3. Zocor 40 mg daily.  4. Diovan 80 mg daily.  5. Coated aspirin 81 mg daily.  6. Plavix 75 mg daily.  7. Avandia 2 mg daily.  8. Zithromax 250 mg one daily.  9. Robitussin DM two teaspoons four times daily.  10.      Tylenol 500 mg four times daily as needed.   DISCHARGE ACTIVITY:  As tolerated.   DIET:  Low-fat, low-salt, 1600 calorie diabetic diet.   SPECIAL INSTRUCTIONS:  1. The patient to have cardiac rehab phase II.  2. Follow up with Dr. Orpah Cobb in 1-2 weeks.  The patient is to call [phone] for appointment.   HISTORY:  This 77 year old black male was admitted due to nausea,  diaphoresis, and numbness of both arms.  The patient has a significant  history of diabetes, hypertension, hyperlipidemia, and radiation treatment  for prostate cancer.   PHYSICAL EXAMINATION:  GENERAL:  Well-developed elderly male in no distress.  HEENT:  Grossly unremarkable.  NECK:  No JVD, no bruits.  CARDIOVASCULAR:  S1 and S2 normal.  No S3.  ABDOMEN:  Soft.  Mild epigastric tenderness.  EXTREMITIES:  No joint or leg swelling.  NEUROLOGIC:  Unremarkable.   LABORATORY DATA:  Hemoglobin 13.1, hematocrit 41.3.  WBC count normal.  Platelet count was 14.1.  PTT was 149.   Electrolytes were normal.  Glucose  slightly elevated at 118.  BUN 15, creatinine 1.1.  Liver enzymes normal.  CK-MB and troponin initially normal.  Subsequently CK was 484 with an MB of  78 and troponin elevated at 1.8.  Liver profile showed a cholesterol of 170,  triglycerides of 31, HDL of 60, and LDL of 104.   Chest x-ray revealed cardiomegaly without any acute disease.  Bone scan  showed degenerative changes in the lumbar spine without evidence of bony  metastasis.  EKG showed sinus rhythm with nonspecific T wave changes.  A  cardiac catheterization showed 100% occlusion of the right coronary artery;  however, it was a very small vessel in caliber, and a small gradient across  the aortic valve with preserved left  systolic function.   HOSPITAL COURSE:  The patient was admitted to the telemetry unit.  He ruled  in for a non-Q wave myocardial infarction.  Hence, he underwent cardiac  catheterization that showed 100% right coronary artery occlusion.  Diagonal  1 had a proximal 70% eccentric lesion, and left circumflex coronary artery  was dominant and had eccentric 50% lesion in the mid-ramus branch.  The  patient's condition remained stable throughout the next 48-72 hours of  hospitalization, at which point he developed a cough with a temperature of  100.7.  He was started on IV antibiotics.  He became afebrile on 10/04/02,  and on 10/04/02 he was discharged home in satisfactory condition with  continuing oral antibiotic, Zithromax, and adjusting home medications to  include Zocor 40 mg daily, Diovan 80 mg daily, and Plavix 75 mg daily.                                               Ricki Rodriguez, M.D.    ASK/MEDQ  D:  12/30/2002  T:  12/31/2002  Job:  [id number]

## 2011-02-03 ENCOUNTER — Inpatient Hospital Stay (HOSPITAL_COMMUNITY): Payer: Medicare Other

## 2011-02-03 LAB — IRON AND TIBC
Iron: 30 ug/dL — ABNORMAL LOW (ref 42–135)
UIBC: 117 ug/dL

## 2011-02-03 LAB — CBC
MCV: 78.8 fL (ref 78.0–100.0)
Platelets: 272 10*3/uL (ref 150–400)
RBC: 3.45 MIL/uL — ABNORMAL LOW (ref 4.22–5.81)
RDW: 15.6 % — ABNORMAL HIGH (ref 11.5–15.5)
WBC: 8 10*3/uL (ref 4.0–10.5)

## 2011-02-03 LAB — COMPREHENSIVE METABOLIC PANEL
ALT: 17 U/L (ref 0–53)
AST: 25 U/L (ref 0–37)
Albumin: 2.3 g/dL — ABNORMAL LOW (ref 3.5–5.2)
Alkaline Phosphatase: 196 U/L — ABNORMAL HIGH (ref 39–117)
Chloride: 112 mEq/L (ref 96–112)
Potassium: 4.5 mEq/L (ref 3.5–5.1)
Sodium: 139 mEq/L (ref 135–145)
Total Bilirubin: 0.2 mg/dL — ABNORMAL LOW (ref 0.3–1.2)
Total Protein: 5.9 g/dL — ABNORMAL LOW (ref 6.0–8.3)

## 2011-02-03 LAB — GLUCOSE, CAPILLARY: Glucose-Capillary: 117 mg/dL — ABNORMAL HIGH (ref 70–99)

## 2011-02-03 LAB — DIFFERENTIAL
Basophils Absolute: 0 10*3/uL (ref 0.0–0.1)
Eosinophils Absolute: 0.5 10*3/uL (ref 0.0–0.7)
Eosinophils Relative: 6 % — ABNORMAL HIGH (ref 0–5)
Lymphocytes Relative: 14 % (ref 12–46)
Lymphs Abs: 1.2 10*3/uL (ref 0.7–4.0)
Neutrophils Relative %: 63 % (ref 43–77)

## 2011-02-03 LAB — PRO B NATRIURETIC PEPTIDE: Pro B Natriuretic peptide (BNP): 10724 pg/mL — ABNORMAL HIGH (ref 0–450)

## 2011-02-04 LAB — GLUCOSE, CAPILLARY
Glucose-Capillary: 105 mg/dL — ABNORMAL HIGH (ref 70–99)
Glucose-Capillary: 106 mg/dL — ABNORMAL HIGH (ref 70–99)
Glucose-Capillary: 177 mg/dL — ABNORMAL HIGH (ref 70–99)

## 2011-02-04 LAB — DIFFERENTIAL
Basophils Relative: 0 % (ref 0–1)
Eosinophils Absolute: 0.1 10*3/uL (ref 0.0–0.7)
Eosinophils Relative: 1 % (ref 0–5)
Lymphs Abs: 1.5 10*3/uL (ref 0.7–4.0)
Monocytes Absolute: 1.5 10*3/uL — ABNORMAL HIGH (ref 0.1–1.0)
Monocytes Relative: 15 % — ABNORMAL HIGH (ref 3–12)
Neutrophils Relative %: 69 % (ref 43–77)

## 2011-02-04 LAB — BLOOD GAS, ARTERIAL
Bicarbonate: 16.9 mEq/L — ABNORMAL LOW (ref 20.0–24.0)
Drawn by: 32470
FIO2: 0.21 %
O2 Saturation: 95.5 %
Patient temperature: 98.6
pH, Arterial: 7.395 (ref 7.350–7.450)

## 2011-02-04 LAB — URINE MICROSCOPIC-ADD ON

## 2011-02-04 LAB — URINALYSIS, ROUTINE W REFLEX MICROSCOPIC
Bilirubin Urine: NEGATIVE
Nitrite: NEGATIVE
Specific Gravity, Urine: 1.015 (ref 1.005–1.030)
Urobilinogen, UA: 0.2 mg/dL (ref 0.0–1.0)
pH: 5 (ref 5.0–8.0)

## 2011-02-04 LAB — RENAL FUNCTION PANEL
Albumin: 2.1 g/dL — ABNORMAL LOW (ref 3.5–5.2)
BUN: 64 mg/dL — ABNORMAL HIGH (ref 6–23)
Creatinine, Ser: 5.31 mg/dL — ABNORMAL HIGH (ref 0.4–1.5)
Glucose, Bld: 42 mg/dL — CL (ref 70–99)
Phosphorus: 5.8 mg/dL — ABNORMAL HIGH (ref 2.3–4.6)
Potassium: 4 mEq/L (ref 3.5–5.1)

## 2011-02-04 LAB — CBC
MCH: 25.6 pg — ABNORMAL LOW (ref 26.0–34.0)
MCHC: 32.5 g/dL (ref 30.0–36.0)
MCV: 78.6 fL (ref 78.0–100.0)
Platelets: 290 10*3/uL (ref 150–400)

## 2011-02-05 LAB — COMPREHENSIVE METABOLIC PANEL
ALT: 23 U/L (ref 0–53)
Albumin: 2.2 g/dL — ABNORMAL LOW (ref 3.5–5.2)
Calcium: 9 mg/dL (ref 8.4–10.5)
GFR calc Af Amer: 12 mL/min — ABNORMAL LOW (ref >60)
Glucose, Bld: 165 mg/dL — ABNORMAL HIGH (ref 70–99)
Sodium: 137 mEq/L (ref 135–145)
Total Protein: 6.1 g/dL (ref 6.0–8.3)

## 2011-02-05 LAB — GLUCOSE, CAPILLARY
Glucose-Capillary: 148 mg/dL — ABNORMAL HIGH (ref 70–99)
Glucose-Capillary: 224 mg/dL — ABNORMAL HIGH (ref 70–99)

## 2011-02-05 LAB — CBC
HCT: 31.5 % — ABNORMAL LOW (ref 39.0–52.0)
MCHC: 33.7 g/dL (ref 30.0–36.0)
Platelets: 323 10*3/uL (ref 150–400)
RDW: 15.1 % (ref 11.5–15.5)

## 2011-02-05 LAB — PHOSPHORUS: Phosphorus: 7 mg/dL — ABNORMAL HIGH (ref 2.3–4.6)

## 2011-02-05 LAB — MAGNESIUM: Magnesium: 2.4 mg/dL (ref 1.5–2.5)

## 2011-02-06 LAB — CROSSMATCH

## 2011-02-06 LAB — CBC
Platelets: 359 10*3/uL (ref 150–400)
RDW: 15.1 % (ref 11.5–15.5)
WBC: 11.3 10*3/uL — ABNORMAL HIGH (ref 4.0–10.5)

## 2011-02-06 LAB — RENAL FUNCTION PANEL
Albumin: 2.4 g/dL — ABNORMAL LOW (ref 3.5–5.2)
GFR calc Af Amer: 12 mL/min — ABNORMAL LOW (ref >60)
Phosphorus: 6.1 mg/dL — ABNORMAL HIGH (ref 2.3–4.6)
Potassium: 5 mEq/L (ref 3.5–5.1)
Sodium: 139 mEq/L (ref 135–145)

## 2011-02-06 LAB — GLUCOSE, CAPILLARY: Glucose-Capillary: 132 mg/dL — ABNORMAL HIGH (ref 70–99)

## 2011-02-06 LAB — PTH, INTACT AND CALCIUM: PTH: 252.5 pg/mL — ABNORMAL HIGH (ref 14.0–72.0)

## 2011-02-07 LAB — PROTEIN ELECTROPH W RFLX QUANT IMMUNOGLOBULINS
Albumin ELP: 42.9 % — ABNORMAL LOW (ref 55.8–66.1)
Alpha-1-Globulin: 8.4 % — ABNORMAL HIGH (ref 2.9–4.9)
Alpha-2-Globulin: 15.9 % — ABNORMAL HIGH (ref 7.1–11.8)
Total Protein ELP: 5.7 g/dL — ABNORMAL LOW (ref 6.0–8.3)

## 2011-02-07 LAB — CBC
HCT: 30.5 % — ABNORMAL LOW (ref 39.0–52.0)
Hemoglobin: 10 g/dL — ABNORMAL LOW (ref 13.0–17.0)
MCH: 26.3 pg (ref 26.0–34.0)
MCHC: 32.8 g/dL (ref 30.0–36.0)
MCV: 80.3 fL (ref 78.0–100.0)

## 2011-02-07 LAB — UIFE/LIGHT CHAINS/TP QN, 24-HR UR
Albumin, U: DETECTED
Free Lambda Lt Chains,Ur: 2.19 mg/dL — ABNORMAL HIGH (ref 0.08–1.01)
Gamma Globulin, Urine: DETECTED — AB
Total Protein, Urine: 201.2 mg/dL

## 2011-02-07 LAB — RENAL FUNCTION PANEL
BUN: 86 mg/dL — ABNORMAL HIGH (ref 6–23)
CO2: 22 mEq/L (ref 19–32)
Calcium: 8.5 mg/dL (ref 8.4–10.5)
Creatinine, Ser: 4.89 mg/dL — ABNORMAL HIGH (ref 0.4–1.5)
GFR calc Af Amer: 14 mL/min — ABNORMAL LOW (ref >60)
Glucose, Bld: 133 mg/dL — ABNORMAL HIGH (ref 70–99)

## 2011-02-07 LAB — IGG, IGA, IGM
IgA: 247 mg/dL (ref 68–378)
IgG (Immunoglobin G), Serum: 1200 mg/dL (ref 700–1600)

## 2011-02-07 LAB — GLUCOSE, CAPILLARY: Glucose-Capillary: 127 mg/dL — ABNORMAL HIGH (ref 70–99)

## 2011-02-07 LAB — IMMUNOFIXATION ADD-ON

## 2011-02-08 LAB — GLUCOSE, CAPILLARY
Glucose-Capillary: 90 mg/dL (ref 70–99)
Glucose-Capillary: 187 mg/dL — ABNORMAL HIGH (ref 70–99)
Glucose-Capillary: 140 mg/dL — ABNORMAL HIGH (ref 70–99)

## 2011-02-08 LAB — RENAL FUNCTION PANEL
Albumin: 2.1 g/dL — ABNORMAL LOW (ref 3.5–5.2)
BUN: 83 mg/dL — ABNORMAL HIGH (ref 6–23)
CO2: 23 mEq/L (ref 19–32)
Calcium: 8.8 mg/dL (ref 8.4–10.5)
Creatinine, Ser: 3.91 mg/dL — ABNORMAL HIGH (ref 0.4–1.5)
GFR calc Af Amer: 18 mL/min — ABNORMAL LOW (ref >60)
GFR calc non Af Amer: 15 mL/min — ABNORMAL LOW (ref >60)

## 2011-02-08 LAB — CBC
MCH: 26.7 pg (ref 26.0–34.0)
MCHC: 32.9 g/dL (ref 30.0–36.0)
MCV: 81 fL (ref 78.0–100.0)
Platelets: 410 10*3/uL — ABNORMAL HIGH (ref 150–400)

## 2011-02-09 LAB — CBC
MCH: 27.6 pg (ref 26.0–34.0)
MCV: 81.8 fL (ref 78.0–100.0)
Platelets: 396 10*3/uL (ref 150–400)
RBC: 4.02 MIL/uL — ABNORMAL LOW (ref 4.22–5.81)
RDW: 15.4 % (ref 11.5–15.5)

## 2011-02-09 LAB — GLUCOSE, CAPILLARY
Glucose-Capillary: 53 mg/dL — ABNORMAL LOW (ref 70–99)
Glucose-Capillary: 54 mg/dL — ABNORMAL LOW (ref 70–99)
Glucose-Capillary: 135 mg/dL — ABNORMAL HIGH (ref 70–99)
Glucose-Capillary: 139 mg/dL — ABNORMAL HIGH (ref 70–99)

## 2011-02-09 LAB — RENAL FUNCTION PANEL
CO2: 26 mEq/L (ref 19–32)
Chloride: 108 mEq/L (ref 96–112)
GFR calc Af Amer: 23 mL/min — ABNORMAL LOW (ref >60)
GFR calc non Af Amer: 19 mL/min — ABNORMAL LOW (ref >60)
Potassium: 4.1 mEq/L (ref 3.5–5.1)
Sodium: 141 mEq/L (ref 135–145)

## 2011-02-10 LAB — RENAL FUNCTION PANEL
Albumin: 2.4 g/dL — ABNORMAL LOW (ref 3.5–5.2)
Chloride: 108 mEq/L (ref 96–112)
Creatinine, Ser: 2.62 mg/dL — ABNORMAL HIGH (ref 0.4–1.5)
GFR calc Af Amer: 28 mL/min — ABNORMAL LOW (ref >60)
GFR calc non Af Amer: 24 mL/min — ABNORMAL LOW (ref >60)
Phosphorus: 4.5 mg/dL (ref 2.3–4.6)
Potassium: 4.5 mEq/L (ref 3.5–5.1)

## 2011-02-10 LAB — GLUCOSE, CAPILLARY: Glucose-Capillary: 73 mg/dL (ref 70–99)

## 2011-02-10 NOTE — Op Note (Signed)
  [name], [name]               ACCOUNT NO.:  [id number]  MEDICAL RECORD NO.:  [id number]           PATIENT TYPE:  I  LOCATION:  4712                         FACILITY:  [facility]  PHYSICIAN:  Gay Moncivais L. Malon Kindle., M.D.DATE OF BIRTH:  12-23-1925  DATE OF PROCEDURE:  02/05/2011 DATE OF DISCHARGE:                              OPERATIVE REPORT   PROCEDURE:  Small bowel capsule endoscopy.  INDICATION:  The patient is admitted with GI bleeding, upper endoscopy was unrevealing.  Colonoscopy revealed diverticular disease.  This is done to make certain that he does not have a small bowel source.  FINDINGS:  The capsule was swallowed and unfortunately remained in the esophagus for nearly 6 hours.  There was a little bit of irritation in the distal esophagus.  After passing into the stomach and finally into the small bowel, the entire examine in small bowel found to be normal. No signs of bleeding.  Assessment of bleeding source seen on small bowel capsule endoscopy.  PLAN:  We will follow patient in clinically.          ______________________________ Llana Aliment Malon Kindle., M.D.     Waldron Session  D:  02/08/2011  T:  02/09/2011  Job:  [id number]  Electronically Signed by Carman Ching M.D. on 02/10/2011 02:46:19 PM

## 2011-02-16 NOTE — Consult Note (Signed)
[name], [name]               ACCOUNT NO.:  [id number]  MEDICAL RECORD NO.:  [id number]           PATIENT TYPE:  I  LOCATION:  4712                         FACILITY:  [facility]  PHYSICIAN:  Sabas Frett L. Lilya Smitherman, M.D.DATE OF BIRTH:  Jan 13, 1926  DATE OF CONSULTATION:  02/02/2011 DATE OF DISCHARGE:                                CONSULTATION  REFERRING PHYSICIAN: Dr. Mahala Menghini of [facility].    REASON FOR CONSULTATION:  Oliguric acute renal failure with creatinine of 4.1.  HISTORY OF PRESENT ILLNESS:  This is an 77 year old man admitted on Jan 28, 2011, with weakness, fatigue, and black stools for several days.  His hemoglobin had dropped from a baseline of 14 to 9.5 at the time of admission.  EGD and colonoscopy were significant for only for diverticulosis; bleed thought to be diverticular which seems to be resolving.  On admission, creatinine was found to be elevated to 3.58 from baseline of 1.24 in April 2012.  This creatinine has failed to improve throughout the past few days and creatinine today is 4.10.  Urine culture from admission is negative.  The patient stated that his only new medication was an additional blood pressure pill (given as a sample from his PCP) which  he had started 2 days prior to admission.  PAST MEDICAL HISTORY: 1. Diverticulosis with history of hospitalization for diverticular     bleed in 2004. 2. Diabetes mellitus, type 2. 3. Hypertension. 4. Chronic kidney disease, stage II, baseline creatinine 1.2. 5. Coronary artery disease status post non-ST-elevation myocardial     infarction in 2004. 6. Remote history of prostate cancer. 7. Pulmonary hypertension. 8. Gout.  MEDICATIONS (INPATIENT):  Amitriptyline, amlodipine, clonidine, Lantus, sliding scale insulin, loratadine, metoprolol, Protonix, and Bactrim.  HOME MEDICATIONS:  Lisinopril, hydrochlorothiazide, lorazepam, amitriptyline, fexofenadine, colchicine, Lantus, and  Humalog.  MEDICATION ALLERGIES:  IBUPROFEN.  SOCIAL HISTORY:  The patient is retired, lives by himself, and has been separated for the past 4 years, has three children.  He quit tobacco use 40 years ago.  Denies alcohol or illicit drug use.  Completed the eleventh grade.  FAMILY HISTORY:  Children and siblings are healthy.  No history of kidney disease.  REVIEW OF SYSTEMS:  Positive for shortness of breath which has increased over the past few days, decreased appetite, fatigue, dizziness, dysuria, weakness, melena, and diarrhea prior to admission.  Negative for fever, chills, increased urinary frequency, chest pain, shortness of breath, cough, palpitations, rash, abdominal pain, or numbness.  PHYSICAL EXAMINATION:  VITAL SIGNS:  Temperature 98.4, blood pressure 149-162/69-79, heart rate 83-101, respiratory rate 18, oxygen saturation 94% on room air.  Capillary blood glucose 93-132. GENERAL APPEARANCE:  No acute distress.  Pleasant. CARDIOVASCULAR:  Regular rate and rhythm.  No murmur. LUNGS:  Poor air movement bilaterally, slightly increased effort.  No discernible wheezes or crackles. ABDOMEN:  Soft, distended, but nontender.  Positive bowel sounds. EXTREMITIES:  Presacral edema present, also +1 lower extremity edema bilaterally. NEUROLOGIC:  Alert and oriented x3, but poor recall of recent events.  PERTINENT LABORATORY DATA:  Sodium 143, potassium 3.8, chloride 114, bicarb 17, BUN 59, creatinine 4.10, glucose  63, magnesium 2.6, phosphorus 5.9, calcium 9.0.  White blood cell count 9.5, hemoglobin 9.3, hematocrit 29.2, glucose 242.  Urinalysis significant for moderate blood and greater than 300 protein, negative for nitrites or leukocyte esterase, 0-2 white blood cells, 3-6 red blood cells.  Urine creatinine 125.15.  Urine sodium 41, fractional excretion of sodium 0.94%.  PERTINENT IMAGING STUDIES:  Chest x-ray significant for pulmonary vascular congestion and bilateral pleural  effusions. Renal ultrasound significant for ascites and bilateral pleural effusions, kidneys appear normal.  ASSESSMENT AND PLAN:  This is an 77 year old man with hypertension, diabetes, chronic kidney disease with a baseline creatinine of 1.2, admitted with a diverticular bleed, now with oliguric acute renal failure with a creatinine of 4.1. 1. Acute kidney injury.  Suspect acute tubular necrosis.  Most likely     etiology at this point seems to be a hypoperfusion event with an     acute drop in hemoglobin secondary to diverticular bleed in the     setting of ongoing angiotensin-converting enzyme inhibitor use.     However, no hypotension seems to be documented on this admission.     The patient likely has underlying diabetic nephropathy.  The     patient currently has nonanion gap metabolic acidosis and is     clinically fluid overloaded.  Blood pressure is adequate for renal     profusion.  Recommend maintaining Foley catheter and following     strict ins and outs.  Attempt diuresis with Lasix.  We will start     sodium bicarb for treatment of acidosis.  We will also check uric     acid.  Recommend discontinuing Bactrim which could further     exacerbate renal failure.  Check CBC with differential in the     morning.  Also recommend protecting left arm from needle sticks in     anticipation of possible future need for dialysis. 2. Diverticular bleed.  The patient has been followed by     Gastroenterology.  This appears to be stable.  Continue to monitor     hemoglobin. 3. Question of urinary tract infection.  On admission, the patient had     pyuria with negative urine culture.  Recommend discontinuing     Bactrim at this time and continue to monitor for signs of     infection. 4. Diabetes mellitus, type 2, stable.  Continue management with home     Lantus and sliding scale insulin. 5. Hypertension.  Blood pressure is slightly increased but not     dangerously, so continue  metoprolol and amlodipine.    ______________________________ Whitney Post, MD   ______________________________ Ll[facility] [employer]. Jizelle Conkey, M.D.    EB/MEDQ  D:  02/02/2011  T:  02/02/2011  Job:  [id number]  Electronically Signed by Whitney Post MD on 02/12/2011 01:23:52 PM Electronically Signed by Beryle Lathe M.D. on 02/16/2011 07:53:05 AM

## 2011-02-16 NOTE — Op Note (Signed)
  [name], MARKMAN               ACCOUNT NO.:  [id number]  MEDICAL RECORD NO.:  [id number]           PATIENT TYPE:  LOCATION:                                 FACILITY:  PHYSICIAN:  Shirley Friar, MDDATE OF BIRTH:  07-14-1926  DATE OF PROCEDURE: DATE OF DISCHARGE:                              OPERATIVE REPORT   PROCEDURE:  Colonoscopy.  INDICATIONS:  Anemia, GI bleed.  MEDICATIONS:  Fentanyl 50 mcg IV and Versed 4 mg IV.  FINDINGS:  Rectal exam was unremarkable.  A pediatric colonoscope was inserted into a fair prepped colon and advanced to the cecum.  Ileocecal valve and appendiceal orifices were identified.  On insertion, there was diffuse diverticula seen in the left side of the colon and scattered diverticula noted in the right side of the colon.  The terminal ileum was intubated and was normal in appearance.  No blood products were seen.  There was yellowish clear stool scattered throughout the colon. On withdrawal, there was a 4-mm sessile polyp that was removed from the ascending colon with snare cautery.  On further withdrawal, there was a 6-mm sessile polyp removed from the descending colon and a 4-mm semi- sessile polyp removed from the descending colon, both with snare cautery.  In the sigmoid colon, there was a 5-mm sessile polyp that was removed with snare cautery and was completely removed, but the polyp tissue could not be retrieved.  On slow withdrawal from the rectum, there were medium-sized internal hemorrhoids noted.  ASSESSMENT: 1. Diffuse diverticulosis. 2. Colon polyps x4 (three removed and retrieved and one removed and     not retrieved, details as stated above). 3. Medium-sized internal hemorrhoids. 4. Suspect bleeding was due to his diverticula.  PLAN: 1. No further workup needed at this time from a GI standpoint 2. Follow up on path. 3. Start back on a diabetic diet.     Shirley Friar, MD     VCS/MEDQ  D:  02/01/2011  T:   02/02/2011  Job:  [id number]  Electronically Signed by Charlott Rakes MD on 02/16/2011 12:22:42 AM

## 2011-02-16 NOTE — Consult Note (Signed)
[name], [name] NO.:  [id number]  MEDICAL RECORD NO.:  [id number]           PATIENT TYPE:  I  LOCATION:  4712                         FACILITY:  [facility]  PHYSICIAN:  Shirley Friar, MDDATE OF BIRTH:  [birth year]  DATE OF CONSULTATION: DATE OF DISCHARGE:                                CONSULTATION   REFERRING PHYSICIAN:  Andreas Blower, MD  INDICATION:  GI bleed.  HISTORY OF PRESENT ILLNESS:  [name] is an 77 year old black male with a history of diverticular bleed in 2004, when he had a colonoscopy done by Dr. Evette Cristal at that time.  He had diffuse diverticulosis greatest on the left side noted during that colonoscopy.  He was admitted secondary to weakness and dysuria, reportedly developed black loose stools, starting this past Friday which continued throughout the weekend.  He has a right-sided pain, pointed to his flank area, but denies any abdominal pain, nausea, vomiting, or hematochezia.  He reports that he was worried about his prostate as he has had a history of prostate cancer and went to [place] in [place] and was reportedly told that things were okay.  He denies any NSAIDs.  He reports that his stools were normal color prior to Friday.  His appetite has been depressed, it is unclear how long that is going on when talking with him.  He reports dizziness for the last 2 weeks.  He denies any NSAIDs. On presentation, his hemoglobin was found to be 9.4 which is down from 14.7 in mid April 2012.  He was heme-negative on presentation.  PAST MEDICAL HISTORY: 1. Diabetes type 2. 2. Chronic kidney disease. 3. Hypertension. 4. Gout. 5. Coronary artery disease. 6. History of myocardial infarction. 7. History of GI bleeding as stated above. 8. History of prostate cancer.  MEDICINES ON ADMISSION:  Lorazepam, amitriptyline, fexofenadine, colchicine, Lantus, Humalog, lisinopril/HCTZ.  ALLERGIES:  IBUPROFEN.  FAMILY HISTORY:   Noncontributory.  SOCIAL HISTORY:  Denies alcohol, denies smoking, denies drugs.  REVIEW OF SYSTEMS:  Negative from GI standpoint except as stated above.  PHYSICAL EXAMINATION:  VITAL SIGNS:  Temperature 98.3, pulse 75, blood pressure 164/85. GENERAL:  Elderly alert, in no acute distress. ABDOMEN:  Periumbilical tenderness with guarding, soft, nondistended, positive bowel sounds.  LABS:  Hemoglobin 9.4 down from 14.7 on January 04, 2011, white blood count 7.8, platelet count 154.  BUN 72, creatinine 3.64.  Fecal occult blood test negative.  Other labs listed in hospital record were reviewed.  IMPRESSION:  An 77 year old black male with 3 days of black tarry stools that were loose without any nausea, vomiting, abdominal pain, or hematochezia.  He has had a 5-g hemoglobin drop in the last month with heme-negative stool.  No signs of any active bleeding at this time.  He does need to have an upper endoscopy done as well as a colonoscopy during his hospitalization.  We will plan do upper endoscopy in a.m. of Jan 30, 2011.  Due to a question of congestive heart failure and bilateral pleural effusions, we will hold off on colon prep today because of possible fluid shifting and/or dehydration and will allow  the medicine doctors to manage his volume status prior to doing the colon prep. We will plan to do an upper endoscopy in the a.m. and plan to do a colonoscopy on Jan 31, 2011 if his cardiopulmonary status allows.  Otherwise, recommend clear liquid diet, n.p.o. after midnight except medicines, and holding of Lantus tonight.     Shirley Friar, MD     VCS/MEDQ  D:  01/29/2011  T:  01/29/2011  Job:  [id number]  cc:   Emeterio Reeve, MD Graylin Shiver, M.D.  Electronically Signed by Charlott Rakes MD on 02/16/2011 12:21:00 AM

## 2011-03-12 NOTE — Group Therapy Note (Signed)
[name], PANTOJA NO.:  [id number]  MEDICAL RECORD NO.:  [id number]           PATIENT TYPE:  LOCATION:                                 FACILITY:  PHYSICIAN:  Pleas Koch, MD        DATE OF BIRTH:  Oct 06, 1925                                PROGRESS NOTE   CURRENT FACILITY: [facility].  ADMISSION DIAGNOSES: Diagnoses today are, 1. Gastrointestinal bleed, likely secondary diverticulosis. 2. Acute tubular necrosis versus acute interstitial nephritis with     mild oliguria. 3. Macroproteinemia with nephrotic range protein. 4. Hypertension. 5. Recent history of urinary tract infection, now currently treated. 6. Diabetes mellitus on insulin. 7. Eye issue. 8. Pulmonary edema. 9. Gout on prednisone.  PERTINENT CONSULTANTS: 1. Shirley Friar, MD of Gastroenterology 2. Nephrology currently, [facility]. Eliott Nine, MD  PERTINENT IMAGING STUDIES: 1. Chest x-ray two-view on Jan 28, 2011, pleural effusions with     pulmonary congestion favor CHF. 2. Ultrasound renal on Jan 29, 2011, 11 mm right renal cyst, negative     for hydronephrosis or focal renal mass, bilateral pleural     effusions. 3. Chest x-ray two-view on Feb 03, 2011, showed improved congestive     heart failure, decreased left airspace disease, small bilateral     pleural effusions, similar.  MEDICATIONS: Currently are; 1. Zyloprim 100 mg daily. 2. Amitriptyline 25 daily. 3. Amlodipine 10 daily. 4. Artificial tears 2 drops. 5. Lasix 160 at bedtime. 6. Insulin moderate sliding-scale. 7. Lantus 12 units at bedtime. 8. Nu-Iron 150 mg b.i.d. 9. Loratadine 5 mg. 10.Metoprolol 50 mg daily. 11.Pantoprazole 40 mg daily. 12.Prednisone 20 mg daily.  Finish tapering dose for gout flare. 13.Ophthalmic, sodium chloride nasal 1 drop b.i.d. to t.i.d. 14.Sodium bicarbonate tablets 1300 t.i.d.  HOSPITAL COURSE TO DATE: This is a pleasant 77 year old male admitted on Jan 28, 2011,  with weakness, fatigue, black stools for several days and hemoglobin dropped from 14-9.5 and this was thought to be resolving GI bleed.  Creatinine is found to be elevated from baseline of 1.24.  He also carries history of prostate cancer.  He was admitted and did feels feverish and does carry a history of non-ST-segment elevation MI in 2004 and history of GI bleed secondary diverticulosis as well.  Please see dictation number [id number] for further details. 1. Anemia.  GI was consulted, currently Dr. Bosie Clos is on board with     the patient's care.  His hemoglobin has dropped and he was     transfused x1 on Feb 04, 2011.  He has had upper endoscopy which     was nonspecific, but the colonoscopy did show possible diverticular     bleed within one area of the lower gastrointestinal tract.  GI was     then reconsulted after he was transfused again as it was thought     that he may be sustaining GI bleed in the small intestine that was     non visualize.  The plan of care is, no further workup at this time     and to await the second  capsule and they have added MiraLax stick     help with this constipation.  He has a low iron count and this is     likely multifactorial being secondary to GI bleed and also anemia     of chronic disease.  He is on Nu-Iron as per Renal, recommendations     for the same and I agree with the same. 2. Pulmonary edema.  The patient came in with mild fluid overload and     was given Lasix initially in the emergency room and because this     kidney function was poor, he was given IV fluids.  However, on     noting his elevation of creatinine, nephrology was consulted. 3. It was noted that he had an echo done on Jan 29, 2011, which showed     EF of 68% with grade 2 diastolic dysfunction, but a PA peak of 61     mmHg.  It was thought that he may be experiencing some diastolic     dysfunction as well pulmonary hypertension and he will need an     outpatient pulmonary  appointment for this. 4. Renal.  The patient has developed what looks like ATN verses acute     interstitial nephritis picture.  His creatinine continues to trend     up until 5.4 and is now stabilized at that level.  Nephrology has     been diligently watching him every day and he has SPEP and UPEP     that is still pending.  His creatinine is stable at 5.4 today with     mild hyperkalemia of 5.0.  He is still producing urine and his in's     and out's over yesterday's on Feb 05, 2011, were about 100.  It     seems that he may be getting slightly oliguric in addition to him     having micro proteinuria.  Workup is currently pending and there is     no indication at this time for dialysis.  However, his potassium     continues to rise and it becomes hyperkalemic, I would leave     Nephrology to determine this.  They recommended today decreasing     his blood pressure medications to check if this is appropriate.     This may allow further perfusion of the kidney.  I would recommend     discontinuing completely tomorrow the amlodipine and in fact we     will make that change. 5. Hypertension.  The patient's blood pressures are moderately well     controlled.  He is currently on amlodipine 10 and metoprolol 50     daily.  We will discontinue the amlodipine. 6. UTI.  The patient had a UTI that was treated initially with     Rocephin.  He was subsequently transitioned back, there was no real     acute evidence of UTI.  His urine cultures returned as being     negative.  It was felt that Bactrim may be a precipitant and his     renal picture enhance also.  The patient will need to be followed     up as an outpatient. 7. Gout.  The patient was given a burst of steroids, which has     improved his gout.  He is on allopurinol as per Nephrology and I     think that this can be done safely provided they agree with the  same in terms of this decreasing his renal function. 8. Blood sugars.  His  blood sugars have been slightly elevated today     on Feb 06, 2011 and we are going to add mealtime coverage, NovoLog     3 units to his blood sugars in addition to his 12 units of Lantus. 9. He has a funny eye issue for which he needs saline drops and we are     giving him the same.  I have updated his daughter, Rosey Bath today at telephone number [phone] and told her that he seems to be doing better from some standpoints, but still has ways to go.  I expect the patient should make a recovery, at some point Physical Therapy will need to screen him prior to discharge to ensure that this is stable.  PHYSICAL EXAMINATION: VITAL SIGNS:  The temperature was 98.3, pulse 69, respirations 17, blood pressure 109-130 over 62-77 and 98% on room air. GENERAL:  The patient was sitting on the toilet when I went to visit him.  However, his chest was clear.  His eyes looked better.  He looks stronger. HEART:  S1, S2.  No murmurs, rubs, or gallops. ABDOMEN:  Soft, nontender. EXTREMITIES:  Lower extremity showed no edema.  The patient seemed very stable today and it was a pleasure taking care of him.  This here by summarizes his course of care from Feb 01, 2011 to the Feb 06, 2011.          ______________________________ Pleas Koch, MD     JS/MEDQ  D:  02/06/2011  T:  02/06/2011  Job:  [id number]  Electronically Signed by Pleas Koch MD on 03/12/2011 07:46:47 PM

## 2011-03-13 ENCOUNTER — Other Ambulatory Visit: Payer: Self-pay | Admitting: Urology

## 2011-03-13 DIAGNOSIS — C61 Malignant neoplasm of prostate: Secondary | ICD-10-CM

## 2011-03-20 ENCOUNTER — Ambulatory Visit (HOSPITAL_COMMUNITY)
Admission: RE | Admit: 2011-03-20 | Discharge: 2011-03-20 | Disposition: A | Discharge status: Home / Self Care | Payer: Medicare Other | Source: Ambulatory Visit | Attending: Urology | Admitting: Urology

## 2011-03-20 ENCOUNTER — Other Ambulatory Visit: Payer: Self-pay | Admitting: Urology

## 2011-03-20 ENCOUNTER — Ambulatory Visit (HOSPITAL_COMMUNITY): Admission: RE | Admit: 2011-03-20 | Payer: Medicare Other | Source: Ambulatory Visit

## 2011-03-20 ENCOUNTER — Encounter (HOSPITAL_COMMUNITY): Payer: Self-pay

## 2011-03-20 ENCOUNTER — Encounter (HOSPITAL_COMMUNITY)
Admission: RE | Admit: 2011-03-20 | Discharge: 2011-03-20 | Disposition: A | Discharge status: Home / Self Care | Payer: Medicare Other | Source: Ambulatory Visit | Attending: Urology | Admitting: Urology

## 2011-03-20 DIAGNOSIS — R52 Pain, unspecified: Secondary | ICD-10-CM

## 2011-03-20 DIAGNOSIS — M1990 Unspecified osteoarthritis, unspecified site: Secondary | ICD-10-CM | POA: Insufficient documentation

## 2011-03-20 DIAGNOSIS — C61 Malignant neoplasm of prostate: Secondary | ICD-10-CM

## 2011-03-20 DIAGNOSIS — M79609 Pain in unspecified limb: Secondary | ICD-10-CM | POA: Insufficient documentation

## 2011-03-20 HISTORY — DX: Malignant neoplasm of prostate: C61

## 2011-03-20 MED ORDER — TECHNETIUM TC 99M MEDRONATE IV KIT
24.0000 | PACK | Freq: Once | INTRAVENOUS | Status: AC | PRN
  Administered 2011-03-20: 24 via INTRAVENOUS

## 2011-04-16 NOTE — Discharge Summary (Signed)
[name], [name]               ACCOUNT NO.:  [id number]  MEDICAL RECORD NO.:  [id number]           PATIENT TYPE:  I  LOCATION:  4712                         FACILITY:  [facility]  PHYSICIAN:  Morningstar Toft I Jakobi Thetford, MD      DATE OF BIRTH:  Sep 20, 1925  DATE OF ADMISSION:  01/28/2011 DATE OF DISCHARGE:  02/10/2011                              DISCHARGE SUMMARY   DISCHARGE DIAGNOSES: 1. Acute gastrointestinal bleeding felt to be secondary to     diverticulosis, resolved. 2. Acute blood loss anemia, status post blood transfusion. 3. Acute-on-chronic renal insufficiency, improving. 4. Gouty arthropathy. 5. Pulmonary edema, resolved. 6. Diabetes mellitus with result of hypoglycemia during hospital stay. 7. Proteinuria. 8. Hypertension. 9. History of non-ST myocardial infarction in 2004.  CONSULTATION: 1. Nephrology consulted, done by Dr. Eliott Nine. 2. Gastroenterology consulted, done by Dr. Madilyn Fireman.  PROCEDURES: 1. Upper endoscopy, normal endoscopy.  Colonoscopy, diffuse     diverticulosis.  Colon polyp, status post removed, which did show     tubular adenoma, no high-grade dysplasia or malignancy identified. 2. Ultrasound of the kidney 11-mm right renal cyst.  Negative for     hydronephrosis, bilateral pleural effusion.  Chest x-ray, improved     congestive heart failure.  Decreased left basilar airspace disease     likely atelectasis, small bilateral pleural effusion. 3. Echo with EF of 55% to 60%, which is consistent with pseudonormal     left ventricular filling pattern, grade 2 diastolic dysfunction.     Doppler are consistent with elevated mean left atrial filling     pressure, aortic valve moderate to calcified leaflet.  HISTORY OF PRESENT ILLNESS:  This is a pleasant 77 year old male admitted on Jan 28, 2011, with weakness, fatigue, black stools for several days and hemoglobin dropped.  He also had a history of prostate cancer and does have a history of non-ST MI, and history of GI  bleeding secondary to diverticulosis.  HOSPITAL COURSE: 1. Anemia.  The patient's hemoglobin dropped and apparently had blood     transfusion.  He had upper endoscopy which was negative for any     acute evidence of bleeding.  Colonoscopy which showed possible     diverticular bleeding.  The patient is status post polypectomy,     which did show tubular adenoma without evidence of high-grade     dysplasia or malignancy.  GI was reconsulted after he was     transfusing again secondary to drop in his hemoglobin.  Capsule     endoscopy was negative and since then, the patient's hemoglobin     remained stable around 11.1 and hematocrit of 32.2.  The patient     will be discharged with iron supplement because of anemia secondary     to GI bleeding and anemia of chronic disease. 2. Pulmonary edema.  Echo did show evidence of diastolic dysfunction     and EF of 55% to 56%.  The patient noticed to have elevated     creatinine.  Nephrology consulted.  Diagnosis, acute tubular     necrosis versus interstitial nephritis.  The patient will be  discharged with Lasix 80 mg p.o. b.i.d. and he will follow up with     Dr. Eliott Nine at [facility] on February 20, 2011, at 8:30 a.m. for     further followup of the patient's creatinine.  The patient has good     urine output. 3. Hypertension.  Blood pressure today elevated.  We will start     amlodipine and metoprolol. 4. UTI completely treated. 5. Gout.  The patient received a steroid that improved his gout.  He     will be on allopurinol. 6. Diabetes mellitus.  His hemoglobin A1c was dropped today to 65.  We     will decrease Lantus to 8 units subcu with NovoLog sliding scale.     The patient is improving.  PHYSICAL EXAMINATION:  VITAL SIGNS:  Currently, the patient's temperature 98.3, blood pressure 173/87, pulse rate 73, respiratory rate 18, and sats 98% on room air. GENERAL:  The patient is lying comfortably on bed, not in respiratory distress or  shortness of breath. HEART:  S1 and S2 with no added sounds. LUNGS:  Normal. ABDOMEN:  Soft and nontender.  Bowel sounds positive. EXTREMITIES:  There is lower limb edema, bilateral.  DISCHARGE MEDICATIONS: 1. Allopurinol 100 mg p.o. daily. 2. Furosemide 80 mg p.o. b.i.d. 3. Iron complex 150 mg twice daily. 4. Metoprolol 25 mg p.o. b.i.d. 5. Polyethylene glycol 17 g daily as needed. 6. Norvasc 10 mg p.o. daily. 7. Insulin Lantus 8 units subcu daily. 8. Insulin sliding scale. 9. Amitriptyline 25 mg daily. 10.Artificial tears. 11.Lorazepam 0.5 mg.  PLAN:  The patient will follow up with his primary care physician as well as Dr. Camille Bal on February 20, 2011, for further assessment of his kidney function and his blood pressure.     Madelyne Millikan Bosie Helper, MD     HIE/MEDQ  D:  02/10/2011  T:  02/10/2011  Job:  [id number]  cc:   [facility]. Eliott Nine, M.D. Emeterio Reeve, MD  Electronically Signed by Ebony Cargo MD on 04/16/2011 02:33:25 PM

## 2011-06-14 LAB — CBC
MCV: 81.3
MCV: 82
Platelets: 286
Platelets: 580 — ABNORMAL HIGH
RBC: 4.72
RDW: 13.5
WBC: 12.7 — ABNORMAL HIGH
WBC: 28.4 — ABNORMAL HIGH

## 2011-06-14 LAB — DIFFERENTIAL
Basophils Absolute: 0
Basophils Absolute: 0
Basophils Relative: 0
Basophils Relative: 0
Eosinophils Absolute: 0
Eosinophils Absolute: 0
Eosinophils Relative: 0
Eosinophils Relative: 0
Lymphocytes Relative: 5 — ABNORMAL LOW
Lymphs Abs: 1.1
Lymphs Abs: 1.4
Monocytes Absolute: 2.3 — ABNORMAL HIGH
Monocytes Relative: 8
Neutro Abs: 24.1 — ABNORMAL HIGH
Neutrophils Relative %: 87 — ABNORMAL HIGH
Neutrophils Relative %: 85 — ABNORMAL HIGH
nRBC: 0

## 2011-06-14 LAB — POCT I-STAT, CHEM 8
Calcium, Ion: 1.19
Creatinine, Ser: 0.9
Glucose, Bld: 351 — ABNORMAL HIGH
HCT: 41
Hemoglobin: 13.9
Potassium: 4.5

## 2011-06-14 LAB — BASIC METABOLIC PANEL
BUN: 15
Chloride: 102
Creatinine, Ser: 1.22
Glucose, Bld: 179 — ABNORMAL HIGH

## 2011-06-14 LAB — URINALYSIS, ROUTINE W REFLEX MICROSCOPIC
Glucose, UA: 500 — AB
Protein, ur: NEGATIVE
pH: 5.5

## 2011-06-28 LAB — COMPREHENSIVE METABOLIC PANEL
ALT: 29
Alkaline Phosphatase: 74
CO2: 22
Chloride: 96
GFR calc non Af Amer: 43 — ABNORMAL LOW
Glucose, Bld: 424 — ABNORMAL HIGH
Potassium: 3.5
Sodium: 127 — ABNORMAL LOW
Total Bilirubin: 0.9
Total Protein: 7.5

## 2011-06-28 LAB — CBC
Hemoglobin: 14.3
RBC: 5.2
WBC: 8.9

## 2011-06-28 LAB — URINALYSIS, ROUTINE W REFLEX MICROSCOPIC
Glucose, UA: >1000 — AB
Ketones, ur: NEGATIVE
Leukocytes, UA: NEGATIVE
Protein, ur: NEGATIVE

## 2011-06-28 LAB — DIFFERENTIAL
Lymphs Abs: 1.6
Monocytes Relative: 11
Neutro Abs: 6.2
Neutrophils Relative %: 69

## 2011-06-28 LAB — URINE CULTURE

## 2011-06-28 LAB — CK TOTAL AND CKMB (NOT AT ARMC): Relative Index: 1.6

## 2011-06-28 LAB — POCT CARDIAC MARKERS: CKMB, poc: 3.2

## 2011-06-28 LAB — B-NATRIURETIC PEPTIDE (CONVERTED LAB): Pro B Natriuretic peptide (BNP): 30.4

## 2011-07-06 ENCOUNTER — Observation Stay (HOSPITAL_COMMUNITY)
Admission: EM | Admit: 2011-07-06 | Discharge: 2011-07-09 | Disposition: A | Discharge status: Home / Self Care | Payer: Medicare Other | Attending: Internal Medicine | Admitting: Internal Medicine

## 2011-07-06 ENCOUNTER — Emergency Department (HOSPITAL_COMMUNITY): Payer: Medicare Other

## 2011-07-06 DIAGNOSIS — E119 Type 2 diabetes mellitus without complications: Secondary | ICD-10-CM | POA: Insufficient documentation

## 2011-07-06 DIAGNOSIS — I129 Hypertensive chronic kidney disease with stage 1 through stage 4 chronic kidney disease, or unspecified chronic kidney disease: Secondary | ICD-10-CM | POA: Insufficient documentation

## 2011-07-06 DIAGNOSIS — M109 Gout, unspecified: Secondary | ICD-10-CM | POA: Insufficient documentation

## 2011-07-06 DIAGNOSIS — R5381 Other malaise: Secondary | ICD-10-CM | POA: Insufficient documentation

## 2011-07-06 DIAGNOSIS — G319 Degenerative disease of nervous system, unspecified: Secondary | ICD-10-CM | POA: Insufficient documentation

## 2011-07-06 DIAGNOSIS — D72829 Elevated white blood cell count, unspecified: Secondary | ICD-10-CM | POA: Insufficient documentation

## 2011-07-06 DIAGNOSIS — N183 Chronic kidney disease, stage 3 unspecified: Secondary | ICD-10-CM | POA: Insufficient documentation

## 2011-07-06 DIAGNOSIS — N289 Disorder of kidney and ureter, unspecified: Principal | ICD-10-CM | POA: Insufficient documentation

## 2011-07-06 DIAGNOSIS — Z79899 Other long term (current) drug therapy: Secondary | ICD-10-CM | POA: Insufficient documentation

## 2011-07-06 DIAGNOSIS — I519 Heart disease, unspecified: Secondary | ICD-10-CM | POA: Insufficient documentation

## 2011-07-06 DIAGNOSIS — E785 Hyperlipidemia, unspecified: Secondary | ICD-10-CM | POA: Insufficient documentation

## 2011-07-06 DIAGNOSIS — M1990 Unspecified osteoarthritis, unspecified site: Secondary | ICD-10-CM | POA: Insufficient documentation

## 2011-07-06 DIAGNOSIS — I2510 Atherosclerotic heart disease of native coronary artery without angina pectoris: Secondary | ICD-10-CM | POA: Insufficient documentation

## 2011-07-06 DIAGNOSIS — C61 Malignant neoplasm of prostate: Secondary | ICD-10-CM | POA: Insufficient documentation

## 2011-07-06 DIAGNOSIS — Z923 Personal history of irradiation: Secondary | ICD-10-CM | POA: Insufficient documentation

## 2011-07-06 LAB — DIFFERENTIAL
Eosinophils Relative: 0 % (ref 0–5)
Lymphocytes Relative: 11 % — ABNORMAL LOW (ref 12–46)
Lymphs Abs: 2.6 10*3/uL (ref 0.7–4.0)
Neutrophils Relative %: 83 % — ABNORMAL HIGH (ref 43–77)

## 2011-07-06 LAB — URINALYSIS, ROUTINE W REFLEX MICROSCOPIC
Bilirubin Urine: NEGATIVE
Protein, ur: 30 mg/dL — AB
Specific Gravity, Urine: 1.012 (ref 1.005–1.030)
Urobilinogen, UA: 0.2 mg/dL (ref 0.0–1.0)

## 2011-07-06 LAB — COMPREHENSIVE METABOLIC PANEL
ALT: 72 U/L — ABNORMAL HIGH (ref 0–53)
Alkaline Phosphatase: 101 U/L (ref 39–117)
CO2: 25 mEq/L (ref 19–32)
Chloride: 99 mEq/L (ref 96–112)
GFR calc Af Amer: 35 mL/min — ABNORMAL LOW (ref >90)
GFR calc non Af Amer: 30 mL/min — ABNORMAL LOW (ref >90)
Glucose, Bld: 273 mg/dL — ABNORMAL HIGH (ref 70–99)
Potassium: 4.7 mEq/L (ref 3.5–5.1)
Sodium: 134 mEq/L — ABNORMAL LOW (ref 135–145)

## 2011-07-06 LAB — CBC
HCT: 37.5 % — ABNORMAL LOW (ref 39.0–52.0)
Hemoglobin: 12.1 g/dL — ABNORMAL LOW (ref 13.0–17.0)
MCV: 87.8 fL (ref 78.0–100.0)
RBC: 4.27 MIL/uL (ref 4.22–5.81)
WBC: 23.5 10*3/uL — ABNORMAL HIGH (ref 4.0–10.5)

## 2011-07-06 LAB — POCT I-STAT TROPONIN I: Troponin i, poc: 0.02 ng/mL (ref 0.00–0.08)

## 2011-07-06 LAB — URINE MICROSCOPIC-ADD ON

## 2011-07-07 LAB — DIFFERENTIAL
Basophils Relative: 0 % (ref 0–1)
Monocytes Relative: 7 % (ref 3–12)
Neutro Abs: 15.2 10*3/uL — ABNORMAL HIGH (ref 1.7–7.7)
Neutrophils Relative %: 81 % — ABNORMAL HIGH (ref 43–77)

## 2011-07-07 LAB — GLUCOSE, CAPILLARY
Glucose-Capillary: 185 mg/dL — ABNORMAL HIGH (ref 70–99)
Glucose-Capillary: 225 mg/dL — ABNORMAL HIGH (ref 70–99)
Glucose-Capillary: 221 mg/dL — ABNORMAL HIGH (ref 70–99)

## 2011-07-07 LAB — BASIC METABOLIC PANEL
CO2: 27 mEq/L (ref 19–32)
Calcium: 9.2 mg/dL (ref 8.4–10.5)
Chloride: 100 mEq/L (ref 96–112)
Glucose, Bld: 284 mg/dL — ABNORMAL HIGH (ref 70–99)
Potassium: 5 mEq/L (ref 3.5–5.1)
Sodium: 135 mEq/L (ref 135–145)

## 2011-07-07 LAB — TSH: TSH: 1.194 u[IU]/mL (ref 0.350–4.500)

## 2011-07-07 LAB — CBC
Hemoglobin: 11 g/dL — ABNORMAL LOW (ref 13.0–17.0)
MCH: 28.2 pg (ref 26.0–34.0)
RBC: 3.9 MIL/uL — ABNORMAL LOW (ref 4.22–5.81)
WBC: 18.9 10*3/uL — ABNORMAL HIGH (ref 4.0–10.5)

## 2011-07-07 LAB — HEMOGLOBIN A1C: Hgb A1c MFr Bld: 8.9 % — ABNORMAL HIGH (ref <5.7)

## 2011-07-07 NOTE — H&P (Signed)
NAME:  [name], [name] NO.:  [id number]  MEDICAL RECORD NO.:  [id number]  LOCATION:  [facility]LED                         FACILITY:  [facility]  PHYSICIAN:  Della Goo, M.D. DATE OF BIRTH:  1926-05-20  DATE OF ADMISSION:  07/06/2011 DATE OF DISCHARGE:                             HISTORY & PHYSICAL   DATE OF ADMISSION:  July 06, 2011.  PRIMARY CARE PHYSICIAN:  Emeterio Reeve, MD, [facility].  CHIEF COMPLAINT:  Severe weakness.  HISTORY OF PRESENT ILLNESS:  This is an 77 year old male who was brought to the emergency department by his family secondary to symptoms of progressive weakness over the past 4 days.  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  Over the past few days, he has not been able to walk or get around.  He complains of being weak all over.    The patient had laboratory studies performed on October 8, with multiple  abnormalities which included an elevated white blood cell count at that time of 21.7, an elevated erythrocyte sedimentation rate found to be 100, a uric acid level elevated at 7.3, and patient had been sent for a workup with the rheumatologist secondary to his history of gout.  He saw Dr. Kellie Simmering who treated him with a steroid injection and a long steroid taper.  Patient was seen again by his primary care physician on October 19, and based on his symptoms and the abnormal laboratory studies on October 8th, he was advised to go to the emergency department for further evaluation.    His emergency department evaluation revealed a white blood cell count  of 23.5 with 83% neutrophils, which was mildly increased from the  previously increased level of 21.7.  His erythrocyte sedimentation rate was now decreased and found to be 21.  The emergency department physician did order blood  cultures x2, secondary to the elevated white blood cell  count, also a urinalysis which was found to be negative and a chest x-ray was also performed which was also found to be negative for signs of  pneumonia.  A CT scan of the head was also performed and was found to be  negative for any acute intracranial abnormalities.  His BUN and creatinine were elevated; however, this was found to be mildly improved compared to  previous results.  On October 8th, the BUN/creatinine was 71/2.62, and on today's laboratory studies the BUN is 70, creatinine is 1.93.  He does have stage III chronic kidney disease.  PAST MEDICAL HISTORY:  Significant for: 1. Type 2 diabetes mellitus. 2. Gout. 3. Hypertension. 4. Chronic kidney disease stage III. 5. History of prostate cancer status post radiotherapy. 6. Osteoarthritis. 7. Hyperlipidemia. 8. Decreased hearing. 9. Chronic pain. 10.Coronary artery disease. 11.Grade 2 diastolic dysfunction with an ejection fraction of 55 to     60% in May 2012. 12.Calcified aortic valve.  MEDICATIONS:  At this time, include: 1. Prevacid 15 mg 1 p.o. daily. 2. Cetirizine 10 mg 1 p.o. daily. 3. Ativan 0.5 mg, 1/2 to  1 tablet p.o. q.h.s. p.r.n. 4. Simvastatin 20 mg 1 p.o. q.h.s. 5. Lasix 80 mg 1 p.o. b.i.d. 6. Metoprolol tartrate 50 mg 1 p.o. b.i.d. 7. Amaryl 2 mg 1 p.o. q.a.m. 8. Iron 150 mg 1 p.o. daily. 9. Allopurinol 300 mg 1 p.o. daily. 10.Colcrys 0.6 mg 1 p.o. daily and twice a day for acute attacks. 11.Prednisone 20 mg tapering weekly.  ALLERGIES:  No known drug allergies.  SOCIAL HISTORY:  Patient lives with his daughter and her family.  His wife is in a nursing home.  He is a nonsmoker, nondrinker.  No history of illicit drug usage.  FAMILY HISTORY:  Noncontributory.  REVIEW OF SYSTEMS:  Pertinents mentioned above in the HPI.  The patient also had diarrhea which was associated with his colchicine therapy and this was decreased to once daily per the  rheumatologist.  PHYSICAL EXAMINATION FINDINGS:  GENERAL:  This is a thin, frail, elderly African American male who is 77 year old, and he is in no acute distress. VITAL SIGNS:  Temperature 98.3 blood pressure 132/76, heart rate 62, respirations 16, O2 sats 100%. HEENT:  Normocephalic, atraumatic.  Pupils equally round and reactive to light.  Extraocular movements are intact.  Funduscopic benign.  There is no scleral icterus.  Nares are patent bilaterally.  Oropharynx is clear. NECK:  Supple.  Full range of motion.  No thyromegaly, adenopathy, or jugular venous distention.  No carotid bruits.  Chest wall nontender. CARDIOVASCULAR:  Regular rate and rhythm.  Chest wall with symmetric and normal excursion. LUNGS:  Clear to auscultation bilaterally.  No rales, rhonchi, or wheezes. ABDOMEN:  Positive bowel sounds.  Soft, nontender, nondistended.  No hepatosplenomegaly. EXTREMITIES:  Without cyanosis, clubbing, or edema. NEUROLOGIC:  Generalized weakness, but otherwise there are no focal deficits on exam.  Gait has not been assessed.  LABORATORY STUDIES:  White blood cell count 23.5, hemoglobin 12.1, hematocrit 37.5, MCV 87.8, platelets 229, neutrophils 83%, lymphocytes 11%.  Sodium 134, potassium 4.7, chloride 99, CO2 25, BUN 17, creatinine 1.93, and please refer to the HPI for the previous laboratory studies from October 8.  Cardiac troponin 0.02.  Urinalysis negative except for small urine hemoglobin, erythrocyte sedimentation rate 21, and please refer to the HPI, previous ESR was 100, and the CT scan of the head results and chest x-ray results are mentioned above in the HPI as well.  ASSESSMENT: 30. 77 year old male being admitted for 23 hour observation secondary     to progressive weakness and leukocytosis. 2. Chronic kidney disease stage III. 3. Hypertension. 4. Type 2 diabetes mellitus. 5. Gout history.  PLAN:  Patient will be admitted for 23 hour observation.   Laboratory studies will be repeated to monitor his white blood cell count as well as his BUN and creatinine and electrolytes.  Patient will be administered IV fluids for rehydration therapy.  A TSH level will also be ordered secondary to patient's symptoms of progressive weakness.  His progressive weakness however may be due to the steroid therapy and taper.  A physical therapy consultation has also been requested for evaluation and the family will consider possible need for placement for further physical therapy following the hospitalization.  Patient's regular medications will be further reconciled.  Sliding scale insulin coverage will be ordered.  Patient is a full code at this time.  DVT prophylaxis has also been ordered.     Della Goo, M.D.     HJ/MEDQ  D:  07/07/2011  T:  07/07/2011  Job:  [id number]  Electronically  Signed by Della Goo M.D. on 07/07/2011 08:06:23 PM

## 2011-07-08 LAB — CBC
MCH: 28.5 pg (ref 26.0–34.0)
MCHC: 32.8 g/dL (ref 30.0–36.0)
MCV: 87.1 fL (ref 78.0–100.0)
Platelets: 216 10*3/uL (ref 150–400)

## 2011-07-08 LAB — BASIC METABOLIC PANEL
Calcium: 9.4 mg/dL (ref 8.4–10.5)
Creatinine, Ser: 1.41 mg/dL — ABNORMAL HIGH (ref 0.50–1.35)
GFR calc non Af Amer: 44 mL/min — ABNORMAL LOW (ref >90)
Glucose, Bld: 239 mg/dL — ABNORMAL HIGH (ref 70–99)
Sodium: 135 mEq/L (ref 135–145)

## 2011-07-08 LAB — GLUCOSE, CAPILLARY
Glucose-Capillary: 216 mg/dL — ABNORMAL HIGH (ref 70–99)
Glucose-Capillary: 198 mg/dL — ABNORMAL HIGH (ref 70–99)
Glucose-Capillary: 272 mg/dL — ABNORMAL HIGH (ref 70–99)

## 2011-07-08 LAB — POTASSIUM: Potassium: 4.7 mEq/L (ref 3.5–5.1)

## 2011-07-09 LAB — CBC
Hemoglobin: 10 g/dL — ABNORMAL LOW (ref 13.0–17.0)
MCH: 27.9 pg (ref 26.0–34.0)
MCHC: 31.8 g/dL (ref 30.0–36.0)
Platelets: ADEQUATE 10*3/uL (ref 150–400)
RDW: 13 % (ref 11.5–15.5)

## 2011-07-09 LAB — BASIC METABOLIC PANEL
Calcium: 8.9 mg/dL (ref 8.4–10.5)
GFR calc Af Amer: 58 mL/min — ABNORMAL LOW (ref >90)
GFR calc non Af Amer: 50 mL/min — ABNORMAL LOW (ref >90)
Potassium: 4.1 mEq/L (ref 3.5–5.1)
Sodium: 134 mEq/L — ABNORMAL LOW (ref 135–145)

## 2011-07-09 LAB — GLUCOSE, CAPILLARY
Glucose-Capillary: 181 mg/dL — ABNORMAL HIGH (ref 70–99)
Glucose-Capillary: 211 mg/dL — ABNORMAL HIGH (ref 70–99)

## 2011-07-09 LAB — VITAMIN B12: Vitamin B-12: 1083 pg/mL — ABNORMAL HIGH (ref 211–911)

## 2011-07-09 LAB — IRON AND TIBC: UIBC: 63 ug/dL — ABNORMAL LOW (ref 125–400)

## 2011-07-10 LAB — GLUCOSE, CAPILLARY

## 2011-07-11 NOTE — Discharge Summary (Signed)
  NAMEQUAME, SPRATLIN NO.:  [id number]  MEDICAL RECORD NO.:  [id number]  LOCATION:  1514                         FACILITY:  [facility]  PHYSICIAN:  Kathlen Mody, MD       DATE OF BIRTH:  1926-08-28  DATE OF ADMISSION:  07/06/2011 DATE OF DISCHARGE:  07/09/2011                              DISCHARGE SUMMARY   PRIMARY CARE PHYSICIAN:  Jasmine December A. Paulino Rily, MD  DISCHARGE DIAGNOSES: 1. Acute-on-chronic kidney disease. 2. Gout flare-up. 3. Hypertension. 4. Type 2 diabetes. 5. Leukocytosis. 6. Osteoarthritis. 7. Coronary artery disease. 8. Diastolic dysfunction of the heart. 9. Prostate cancer. 10.Hyperlipidemia.  PERTINENT LABORATORY DATA:  On admission, the patient had a CBC done which showed a WBC count of 23.5, hemoglobin of 12.1, hematocrit of 37.5, platelets of 229.  Point-of-care troponin negative.  Comprehensive metabolic panel significant for creatinine of 1.9, sodium of 134, BUN of 70, glucose 273.  ESR slightly elevated at 21.  Urinalysis negative for nitrites and leukocytes.  Blood cultures showed no growth.  Anemia panel showed an elevated ferritin and elevated B12 and folate levels.  On the day of discharge, patient's white count has come down to 17.8, hemoglobin of 10, hematocrit of 31.4.  Basic metabolic panel showed a normal creatinine, slightly elevated BUN at 45,  sodium of 134.  DIAGNOSTIC STUDIES:  The patient had a CT head without contrast, shows no acute intracranial abnormality.  Two-view chest x-ray shows no acute cardiopulmonary process.  BRIEF HOSPITAL COURSE:  An 77 year old gentleman, brought in by the daughter for generalized weakness.  The patient was found to have gout flare-up and was in acute renal failure.  The patient was on high-dose diuretics, which were stopped secondary to renal insufficiency.  The patient was started on prednisone for his gout flare-up.  His weakness and pain in his extremities from the gout have  improved.  1. Acute-on-chronic renal insufficiency, mostly prerenal secondary to     excessive diuretics, even after Lasix was stopped.  He was given     gentle hydration and his creatinine normalized and he was     discharged on a small dose of Lasix for his diastolic dysfunction     of the heart and he was recommended to follow with his primary care     physician/renal doctor about the Lasix. 2. Hypertension, suboptimally controlled.  He was started on     amlodipine 5 mg daily.  We will also recommend to follow up with     his primary care physician.  On the day of discharge, patient's vitals showed a temperature of 98.6, pulse of 71, respirations 18, blood pressure 152/65, and saturating 100% on room air.  His exam was within normal limits.  He was discharged home with home care.  Follow up with his primary care physician in 1-2 weeks.          ______________________________ Kathlen Mody, MD     VA/MEDQ  D:  07/10/2011  T:  07/10/2011  Job:  [id number]  Electronically Signed by Kathlen Mody MD on 07/11/2011 11:12:00 PM

## 2011-07-13 LAB — CULTURE, BLOOD (ROUTINE X 2)
Culture: NO GROWTH
Culture: NO GROWTH

## 2011-09-11 IMAGING — CR DG CHEST 1V PORT
1 series · 1 of 1 positions shown · non-contrast
Comparison: Chest x-ray of 01/28/2011

CLINICAL DATA: CHF, follow-up

PORTABLE CHEST - 1 VIEW

[AP]
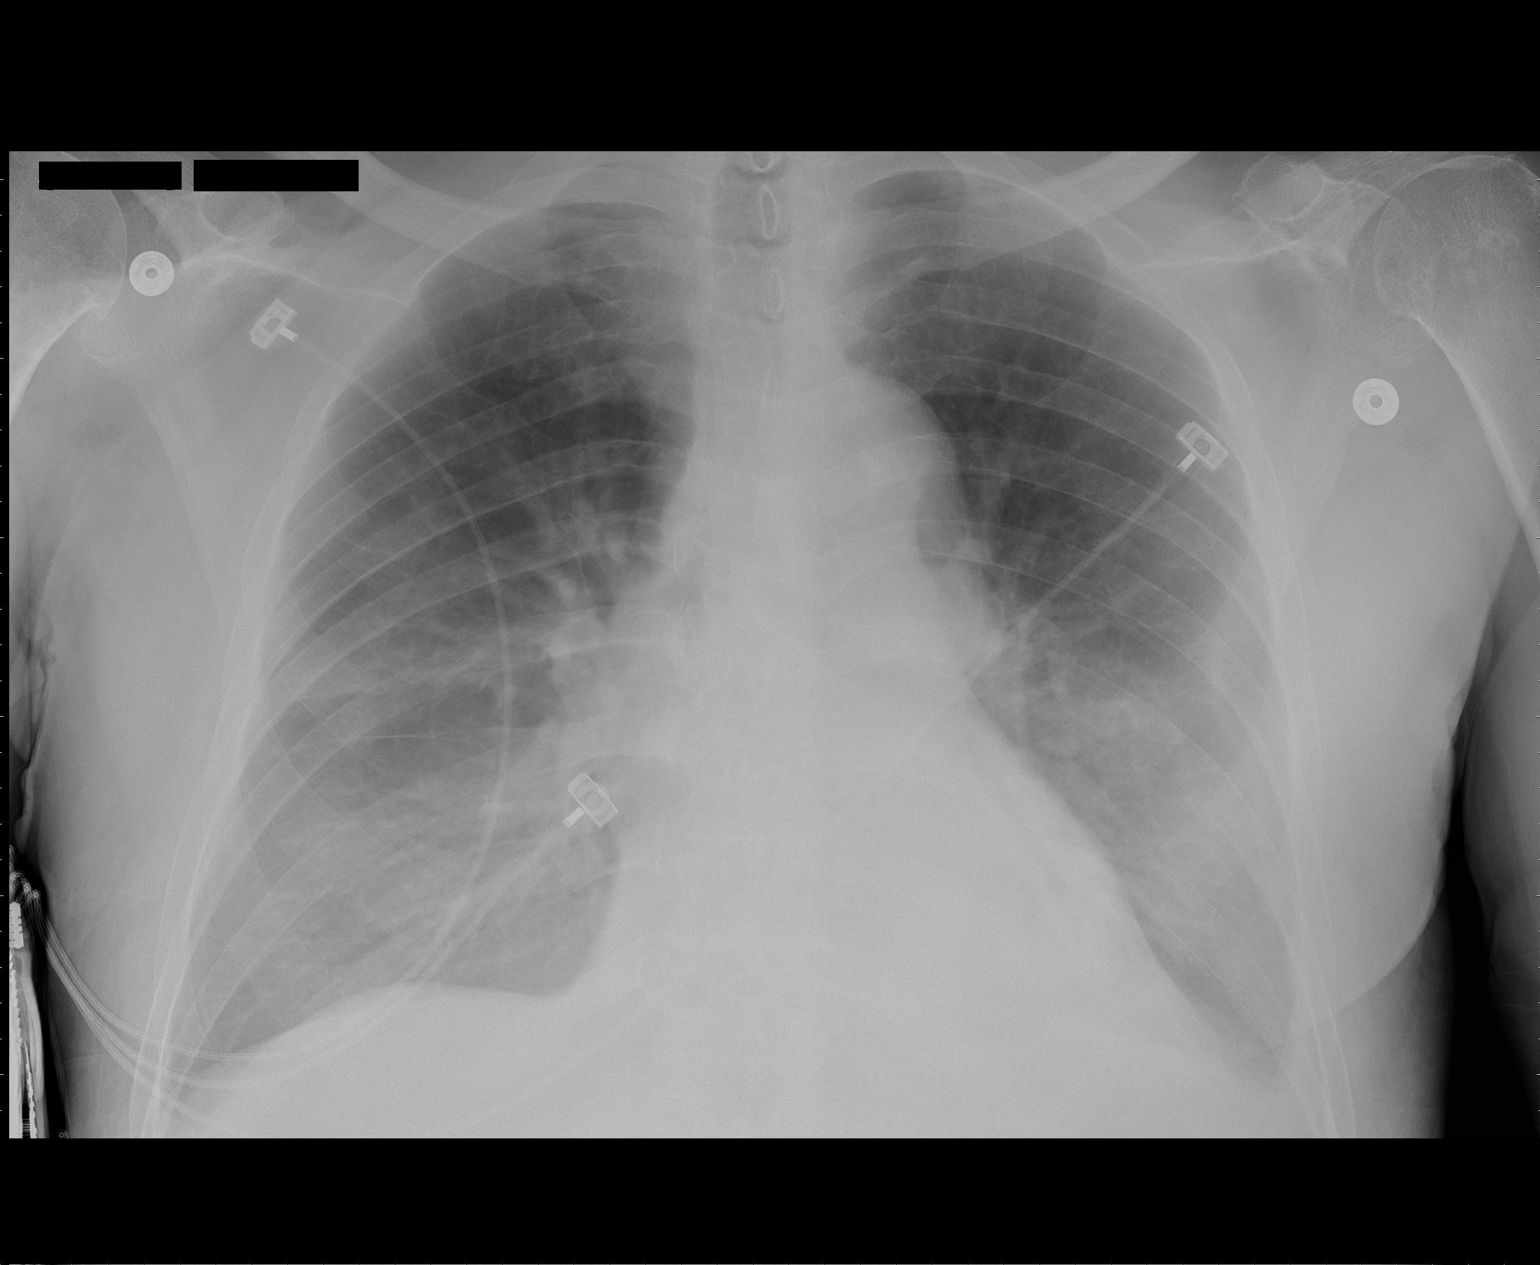

[1 of 1 positions shown; findings below may reference images not displayed]

FINDINGS: There may be slight worsening of pulmonary vascular
congestion with mild cardiomegaly and small effusions remaining.
Mild basilar atelectasis is present.
IMPRESSION: Slight worsening of mild CHF pattern with pulmonary vascular
congestion and effusions.

## 2011-09-12 IMAGING — US US RENAL
1 series · 14 of 25 positions shown · non-contrast
Comparison: Ultrasound 01/29/2011

CLINICAL DATA: Renal failure

RENAL/URINARY TRACT ULTRASOUND COMPLETE

[Series 1: us renal · 0.35mm/px · 14 of 32 slices shown]
[im 1/32]
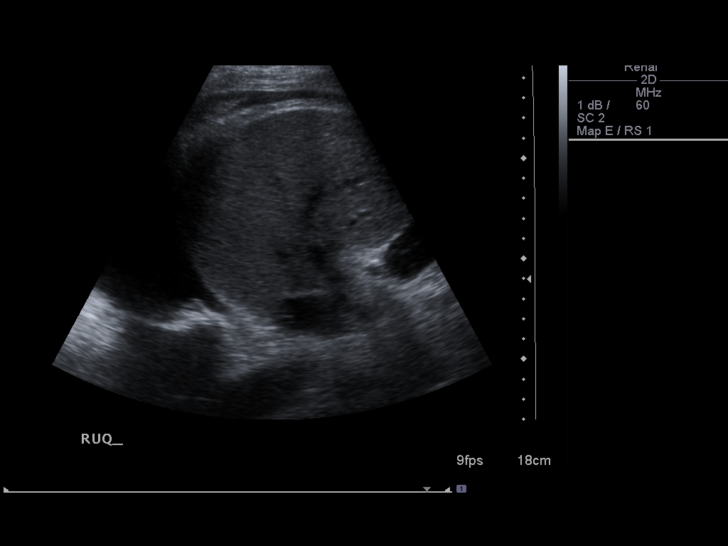
[im 3/32]
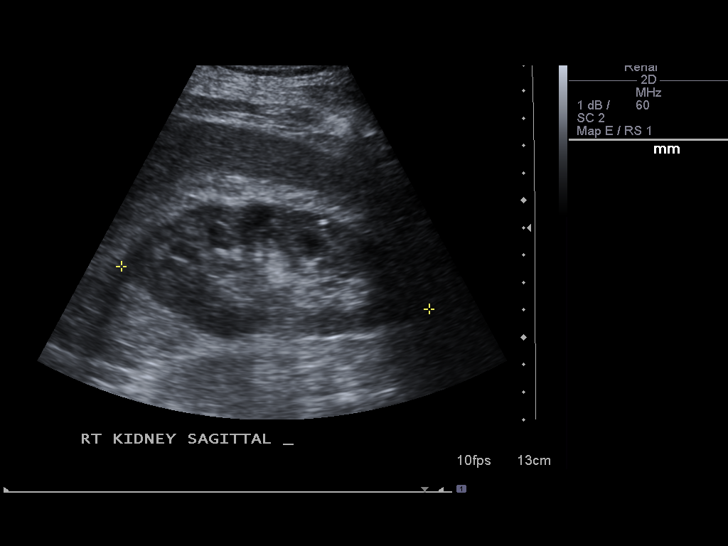
[im 6/32]
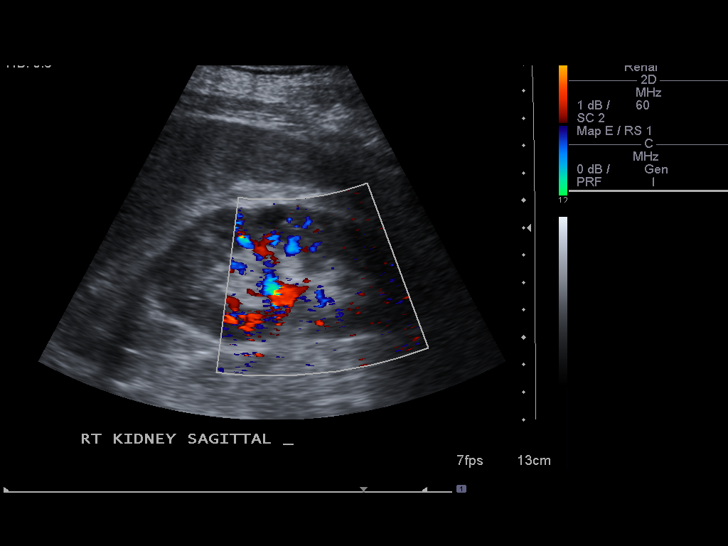
[im 8/32]
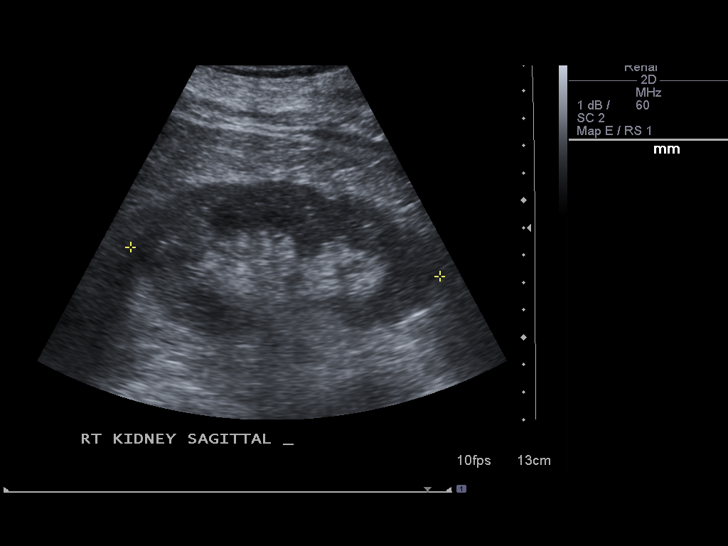
[im 11/32]
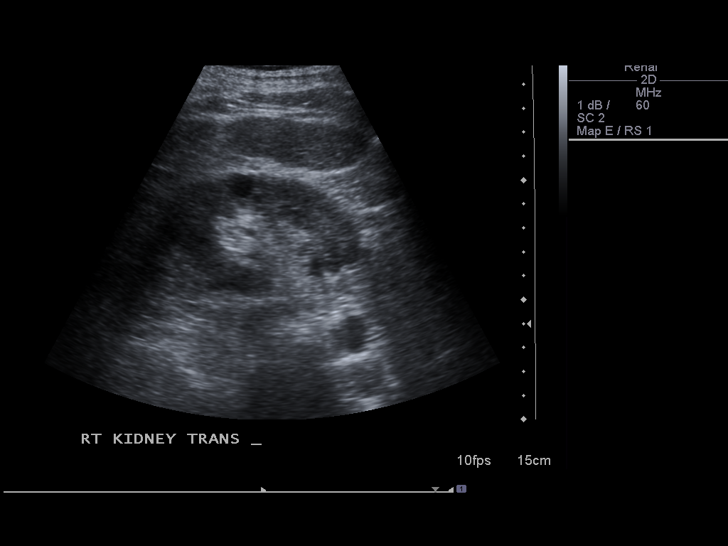
[im 12/32]
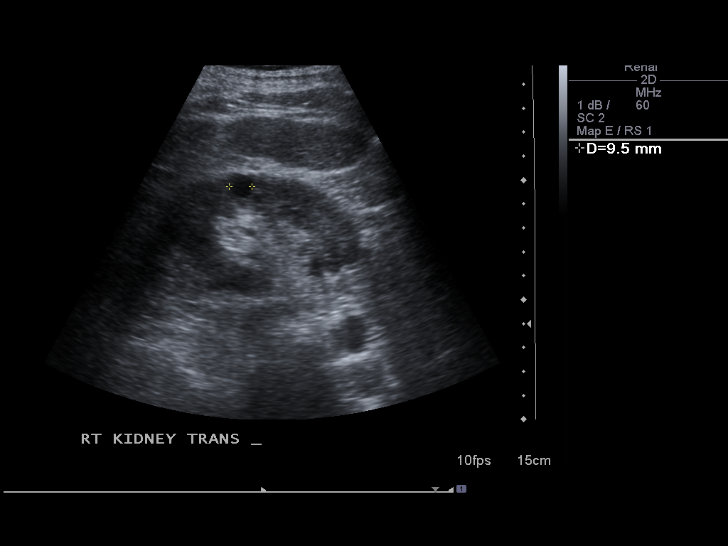
[im 15/32]
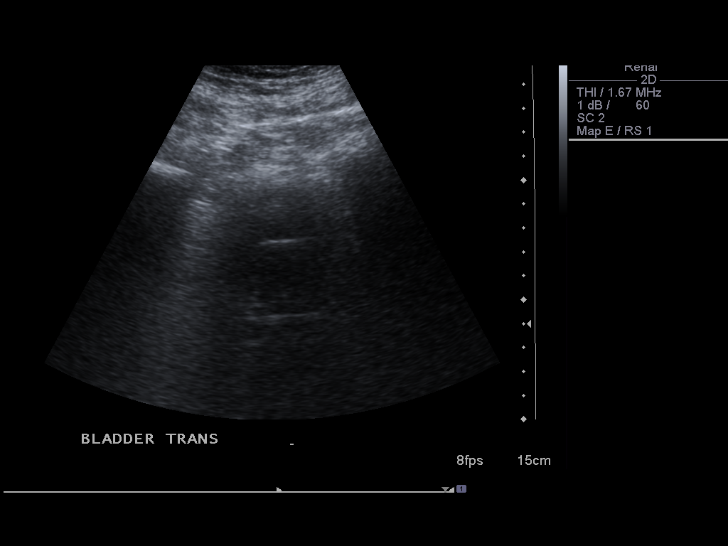
[im 17/32]
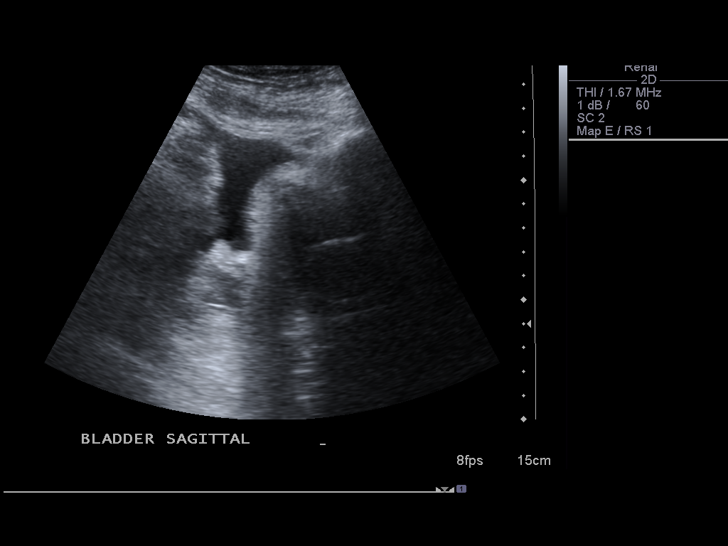
[im 20/32]
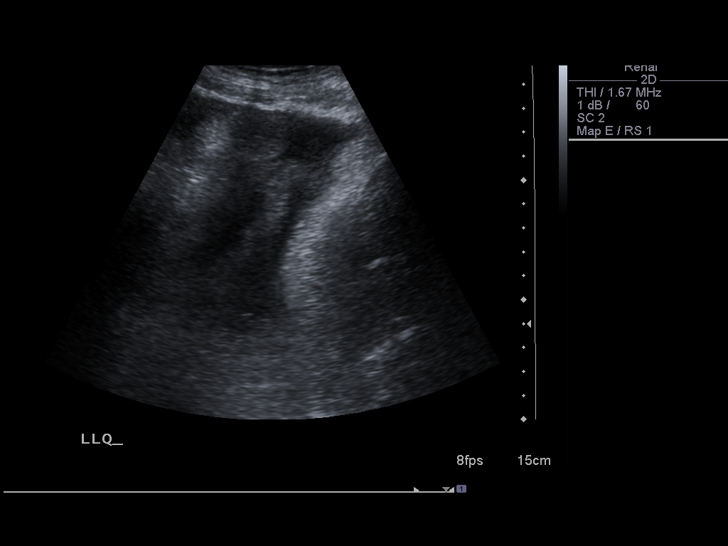
[im 21/32]
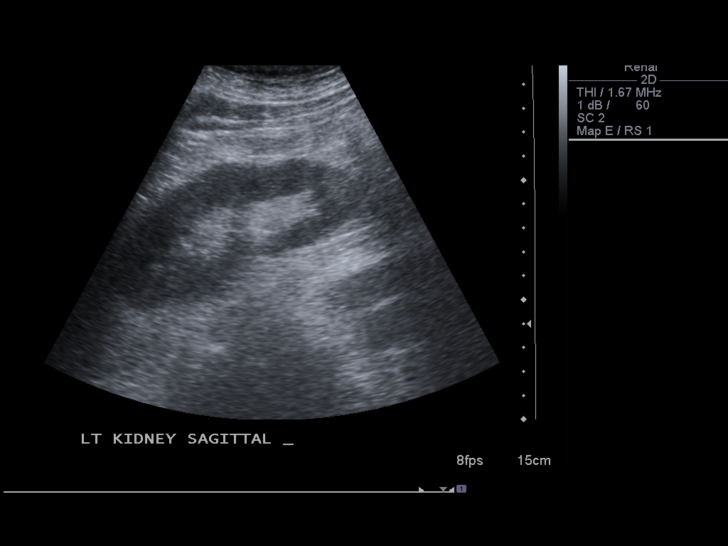
[im 24/32]
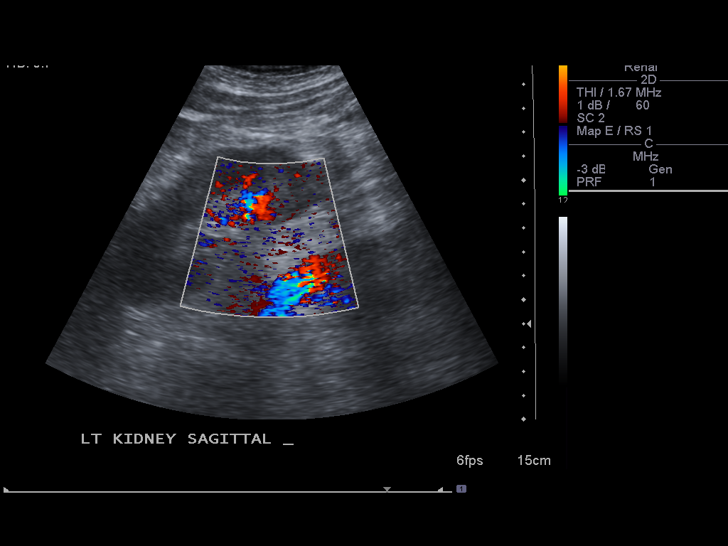
[im 26/32]
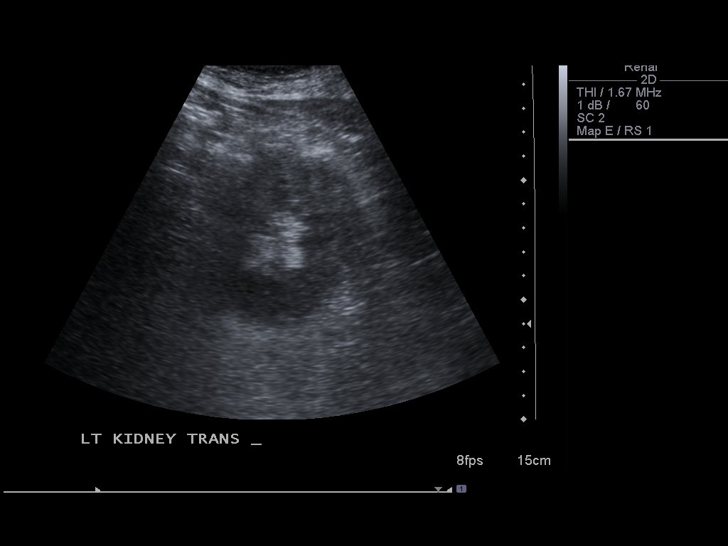
[im 29/32]
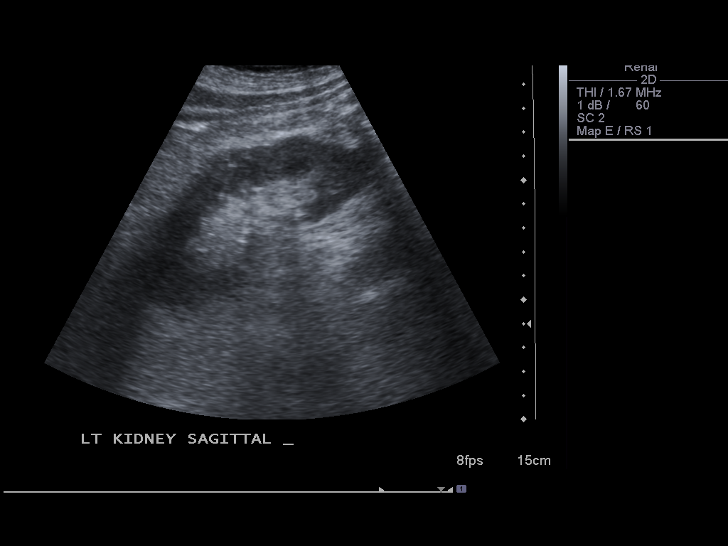
[im 32/32]
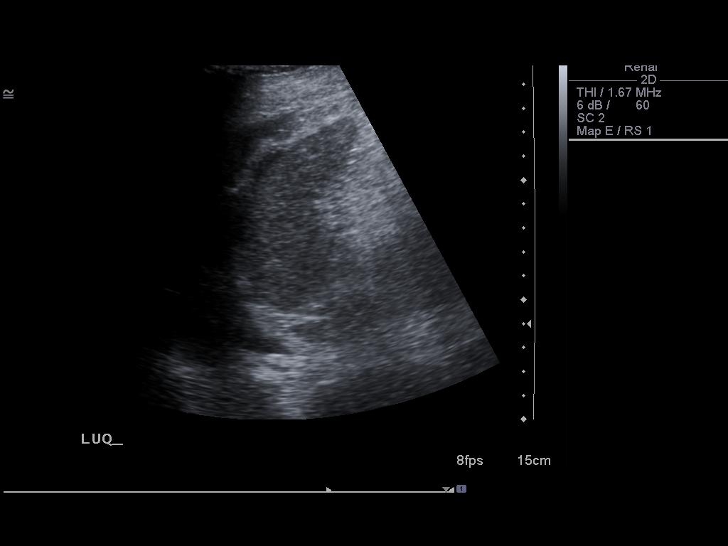

[14 of 25 positions shown; findings below may reference images not displayed]

FINDINGS: Right Kidney:  11.3 cm in length.  Negative for obstruction.  Renal
cortex is normal.  10 mm cyst.

Left Kidney:  10.8 cm in length.  Negative for obstruction or mass.

Bladder:  Foley catheter in place with decompressed bladder.

Ascites is present in the abdomen of a mild to moderate degree.
Bilateral pleural effusions are present.
IMPRESSION: The kidneys are sonographically normal.  The urinary bladder is
empty with a Foley catheter.

Ascites and bilateral pleural effusion.

## 2011-11-08 ENCOUNTER — Emergency Department (HOSPITAL_COMMUNITY): Payer: Medicare Other

## 2011-11-08 ENCOUNTER — Other Ambulatory Visit: Payer: Self-pay

## 2011-11-08 ENCOUNTER — Observation Stay (HOSPITAL_COMMUNITY)
Admission: EM | Admit: 2011-11-08 | Discharge: 2011-11-10 | Disposition: A | Discharge status: Home / Self Care | Payer: Medicare Other | Attending: Family Medicine | Admitting: Family Medicine

## 2011-11-08 ENCOUNTER — Encounter (HOSPITAL_COMMUNITY): Payer: Self-pay | Admitting: Emergency Medicine

## 2011-11-08 DIAGNOSIS — W19XXXA Unspecified fall, initial encounter: Secondary | ICD-10-CM | POA: Insufficient documentation

## 2011-11-08 DIAGNOSIS — K5730 Diverticulosis of large intestine without perforation or abscess without bleeding: Secondary | ICD-10-CM | POA: Insufficient documentation

## 2011-11-08 DIAGNOSIS — M542 Cervicalgia: Secondary | ICD-10-CM | POA: Insufficient documentation

## 2011-11-08 DIAGNOSIS — D72829 Elevated white blood cell count, unspecified: Secondary | ICD-10-CM | POA: Insufficient documentation

## 2011-11-08 DIAGNOSIS — R32 Unspecified urinary incontinence: Secondary | ICD-10-CM | POA: Insufficient documentation

## 2011-11-08 DIAGNOSIS — M549 Dorsalgia, unspecified: Secondary | ICD-10-CM | POA: Insufficient documentation

## 2011-11-08 DIAGNOSIS — M545 Low back pain, unspecified: Secondary | ICD-10-CM | POA: Insufficient documentation

## 2011-11-08 DIAGNOSIS — Z794 Long term (current) use of insulin: Secondary | ICD-10-CM | POA: Insufficient documentation

## 2011-11-08 DIAGNOSIS — I700 Atherosclerosis of aorta: Secondary | ICD-10-CM | POA: Insufficient documentation

## 2011-11-08 DIAGNOSIS — M47817 Spondylosis without myelopathy or radiculopathy, lumbosacral region: Secondary | ICD-10-CM | POA: Insufficient documentation

## 2011-11-08 DIAGNOSIS — IMO0002 Reserved for concepts with insufficient information to code with codable children: Secondary | ICD-10-CM | POA: Insufficient documentation

## 2011-11-08 DIAGNOSIS — M109 Gout, unspecified: Secondary | ICD-10-CM

## 2011-11-08 DIAGNOSIS — Y92009 Unspecified place in unspecified non-institutional (private) residence as the place of occurrence of the external cause: Secondary | ICD-10-CM | POA: Insufficient documentation

## 2011-11-08 DIAGNOSIS — S32009A Unspecified fracture of unspecified lumbar vertebra, initial encounter for closed fracture: Secondary | ICD-10-CM

## 2011-11-08 DIAGNOSIS — M1990 Unspecified osteoarthritis, unspecified site: Secondary | ICD-10-CM

## 2011-11-08 DIAGNOSIS — C61 Malignant neoplasm of prostate: Secondary | ICD-10-CM | POA: Insufficient documentation

## 2011-11-08 DIAGNOSIS — M79609 Pain in unspecified limb: Secondary | ICD-10-CM | POA: Insufficient documentation

## 2011-11-08 DIAGNOSIS — Z8546 Personal history of malignant neoplasm of prostate: Secondary | ICD-10-CM

## 2011-11-08 DIAGNOSIS — E785 Hyperlipidemia, unspecified: Secondary | ICD-10-CM | POA: Insufficient documentation

## 2011-11-08 DIAGNOSIS — E119 Type 2 diabetes mellitus without complications: Secondary | ICD-10-CM | POA: Insufficient documentation

## 2011-11-08 DIAGNOSIS — Z8719 Personal history of other diseases of the digestive system: Secondary | ICD-10-CM

## 2011-11-08 DIAGNOSIS — Z79899 Other long term (current) drug therapy: Secondary | ICD-10-CM | POA: Insufficient documentation

## 2011-11-08 DIAGNOSIS — F411 Generalized anxiety disorder: Secondary | ICD-10-CM | POA: Insufficient documentation

## 2011-11-08 DIAGNOSIS — R9431 Abnormal electrocardiogram [ECG] [EKG]: Secondary | ICD-10-CM | POA: Insufficient documentation

## 2011-11-08 DIAGNOSIS — K219 Gastro-esophageal reflux disease without esophagitis: Secondary | ICD-10-CM

## 2011-11-08 DIAGNOSIS — I10 Essential (primary) hypertension: Secondary | ICD-10-CM | POA: Insufficient documentation

## 2011-11-08 DIAGNOSIS — F0390 Unspecified dementia without behavioral disturbance: Secondary | ICD-10-CM | POA: Insufficient documentation

## 2011-11-08 HISTORY — DX: Essential (primary) hypertension: I10

## 2011-11-08 HISTORY — DX: Unspecified fracture of unspecified lumbar vertebra, initial encounter for closed fracture: S32.009A

## 2011-11-08 HISTORY — DX: Unspecified dementia, unspecified severity, without behavioral disturbance, psychotic disturbance, mood disturbance, and anxiety: F03.90

## 2011-11-08 LAB — BASIC METABOLIC PANEL
Calcium: 10.3 mg/dL (ref 8.4–10.5)
GFR calc Af Amer: 46 mL/min — ABNORMAL LOW (ref >90)
GFR calc non Af Amer: 39 mL/min — ABNORMAL LOW (ref >90)
Glucose, Bld: 410 mg/dL — ABNORMAL HIGH (ref 70–99)
Sodium: 132 mEq/L — ABNORMAL LOW (ref 135–145)

## 2011-11-08 LAB — URINE MICROSCOPIC-ADD ON

## 2011-11-08 LAB — GLUCOSE, CAPILLARY
Glucose-Capillary: 210 mg/dL — ABNORMAL HIGH (ref 70–99)
Glucose-Capillary: 99 mg/dL (ref 70–99)
Glucose-Capillary: 132 mg/dL — ABNORMAL HIGH (ref 70–99)
Glucose-Capillary: 145 mg/dL — ABNORMAL HIGH (ref 70–99)
Glucose-Capillary: 184 mg/dL — ABNORMAL HIGH (ref 70–99)

## 2011-11-08 LAB — DIFFERENTIAL
Basophils Absolute: 0 10*3/uL (ref 0.0–0.1)
Eosinophils Absolute: 0 10*3/uL (ref 0.0–0.7)
Lymphocytes Relative: 5 % — ABNORMAL LOW (ref 12–46)
Monocytes Relative: 5 % (ref 3–12)
Neutrophils Relative %: 90 % — ABNORMAL HIGH (ref 43–77)

## 2011-11-08 LAB — CBC
MCHC: 33 g/dL (ref 30.0–36.0)
Platelets: 351 10*3/uL (ref 150–400)
RDW: 13.2 % (ref 11.5–15.5)
WBC: 27.1 10*3/uL — ABNORMAL HIGH (ref 4.0–10.5)

## 2011-11-08 LAB — HEMOGLOBIN A1C: Hgb A1c MFr Bld: 11.9 % — ABNORMAL HIGH (ref <5.7)

## 2011-11-08 LAB — URINALYSIS, ROUTINE W REFLEX MICROSCOPIC
Nitrite: NEGATIVE
Protein, ur: 100 mg/dL — AB
Urobilinogen, UA: 0.2 mg/dL (ref 0.0–1.0)

## 2011-11-08 MED ORDER — RIVASTIGMINE 4.6 MG/24HR TD PT24
4.6000 mg | MEDICATED_PATCH | Freq: Every day | TRANSDERMAL | Status: DC
  Administered 2011-11-08 – 2011-11-10 (×3): 4.6 mg via TRANSDERMAL
  Filled 2011-11-08 (×3): qty 1

## 2011-11-08 MED ORDER — LORATADINE 10 MG PO TABS
10.0000 mg | ORAL_TABLET | Freq: Every day | ORAL | Status: DC
  Administered 2011-11-08 – 2011-11-10 (×3): 10 mg via ORAL
  Filled 2011-11-08 (×3): qty 1

## 2011-11-08 MED ORDER — HYDROMORPHONE HCL PF 1 MG/ML IJ SOLN
1.0000 mg | Freq: Once | INTRAMUSCULAR | Status: AC
  Administered 2011-11-08: 0.5 mg via INTRAVENOUS
  Filled 2011-11-08: qty 1

## 2011-11-08 MED ORDER — ONDANSETRON HCL 4 MG PO TABS: 4.0000 mg | ORAL_TABLET | Freq: Four times a day (QID) | ORAL | Status: DC | PRN

## 2011-11-08 MED ORDER — OXYCODONE-ACETAMINOPHEN 5-325 MG PO TABS
1.0000 | ORAL_TABLET | ORAL | Status: DC | PRN
  Administered 2011-11-09: 1 via ORAL
  Filled 2011-11-08 (×2): qty 1

## 2011-11-08 MED ORDER — DEXTROSE-NACL 5-0.9 % IV SOLN
INTRAVENOUS | Status: DC
  Administered 2011-11-08: 09:00:00 via INTRAVENOUS

## 2011-11-08 MED ORDER — INSULIN ASPART 100 UNIT/ML ~~LOC~~ SOLN
10.0000 [IU] | Freq: Once | SUBCUTANEOUS | Status: AC
  Administered 2011-11-08: 10 [IU] via SUBCUTANEOUS
  Filled 2011-11-08: qty 1

## 2011-11-08 MED ORDER — POLYSACCHARIDE IRON 150 MG PO CAPS
150.0000 mg | ORAL_CAPSULE | Freq: Every day | ORAL | Status: DC
  Administered 2011-11-08 – 2011-11-10 (×3): 150 mg via ORAL
  Filled 2011-11-08 (×3): qty 1

## 2011-11-08 MED ORDER — LORAZEPAM 0.5 MG PO TABS: 0.5000 mg | ORAL_TABLET | Freq: Four times a day (QID) | ORAL | Status: DC | PRN

## 2011-11-08 MED ORDER — METOPROLOL TARTRATE 50 MG PO TABS
50.0000 mg | ORAL_TABLET | Freq: Two times a day (BID) | ORAL | Status: DC
Start: 2011-11-08 — End: 2011-11-10
  Administered 2011-11-08 – 2011-11-10 (×5): 50 mg via ORAL
  Filled 2011-11-08 (×2): qty 1
  Filled 2011-11-08: qty 2
  Filled 2011-11-08 (×3): qty 1

## 2011-11-08 MED ORDER — DOCUSATE SODIUM 100 MG PO CAPS
100.0000 mg | ORAL_CAPSULE | Freq: Two times a day (BID) | ORAL | Status: DC
  Administered 2011-11-08 – 2011-11-10 (×4): 100 mg via ORAL
  Filled 2011-11-08 (×7): qty 1

## 2011-11-08 MED ORDER — ENOXAPARIN SODIUM 40 MG/0.4ML ~~LOC~~ SOLN
40.0000 mg | SUBCUTANEOUS | Status: DC
  Administered 2011-11-08 – 2011-11-09 (×2): 40 mg via SUBCUTANEOUS
  Filled 2011-11-08 (×3): qty 0.4

## 2011-11-08 MED ORDER — PANTOPRAZOLE SODIUM 20 MG PO TBEC
20.0000 mg | DELAYED_RELEASE_TABLET | Freq: Every day | ORAL | Status: DC
  Administered 2011-11-08 – 2011-11-10 (×3): 20 mg via ORAL
  Filled 2011-11-08 (×3): qty 1

## 2011-11-08 MED ORDER — INSULIN ASPART 100 UNIT/ML ~~LOC~~ SOLN: 0.0000 [IU] | Freq: Three times a day (TID) | SUBCUTANEOUS | Status: DC

## 2011-11-08 MED ORDER — FUROSEMIDE 40 MG PO TABS
40.0000 mg | ORAL_TABLET | Freq: Every day | ORAL | Status: DC
  Administered 2011-11-08 – 2011-11-09 (×2): 40 mg via ORAL
  Filled 2011-11-08 (×2): qty 1

## 2011-11-08 MED ORDER — ONDANSETRON HCL 4 MG/2ML IJ SOLN: 4.0000 mg | Freq: Four times a day (QID) | INTRAMUSCULAR | Status: DC | PRN

## 2011-11-08 MED ORDER — INSULIN ASPART 100 UNIT/ML ~~LOC~~ SOLN
0.0000 [IU] | Freq: Three times a day (TID) | SUBCUTANEOUS | Status: DC
  Administered 2011-11-08: 1 [IU] via SUBCUTANEOUS
  Administered 2011-11-09: 2 [IU] via SUBCUTANEOUS
  Administered 2011-11-09 – 2011-11-10 (×2): 3 [IU] via SUBCUTANEOUS
  Administered 2011-11-10: 1 [IU] via SUBCUTANEOUS

## 2011-11-08 MED ORDER — SODIUM CHLORIDE 0.9 % IV SOLN
INTRAVENOUS | Status: DC
  Administered 2011-11-08: 20:00:00 via INTRAVENOUS

## 2011-11-08 MED ORDER — SODIUM CHLORIDE 0.9 % IV BOLUS (SEPSIS)
500.0000 mL | Freq: Once | INTRAVENOUS | Status: AC
  Administered 2011-11-08: 500 mL via INTRAVENOUS

## 2011-11-08 MED ORDER — INSULIN ASPART 100 UNIT/ML ~~LOC~~ SOLN
0.0000 [IU] | SUBCUTANEOUS | Status: DC
  Administered 2011-11-08: 1 [IU] via SUBCUTANEOUS
  Administered 2011-11-08: 3 [IU] via SUBCUTANEOUS
  Filled 2011-11-08: qty 1
  Filled 2011-11-08: qty 3
  Filled 2011-11-08: qty 1

## 2011-11-08 MED ORDER — INSULIN GLARGINE 100 UNIT/ML ~~LOC~~ SOLN
15.0000 [IU] | Freq: Every day | SUBCUTANEOUS | Status: DC
  Administered 2011-11-08 – 2011-11-09 (×2): 15 [IU] via SUBCUTANEOUS
  Filled 2011-11-08: qty 3

## 2011-11-08 MED ORDER — ONDANSETRON HCL 4 MG/2ML IJ SOLN
4.0000 mg | Freq: Once | INTRAMUSCULAR | Status: AC
  Administered 2011-11-08: 4 mg via INTRAVENOUS
  Filled 2011-11-08: qty 2

## 2011-11-08 MED ORDER — ZOLPIDEM TARTRATE 5 MG PO TABS: 5.0000 mg | ORAL_TABLET | Freq: Every evening | ORAL | Status: DC | PRN

## 2011-11-08 MED ORDER — SIMVASTATIN 20 MG PO TABS
20.0000 mg | ORAL_TABLET | Freq: Every day | ORAL | Status: DC
  Administered 2011-11-08 – 2011-11-09 (×2): 20 mg via ORAL
  Filled 2011-11-08 (×3): qty 1

## 2011-11-08 MED ORDER — HYDROMORPHONE HCL PF 1 MG/ML IJ SOLN
1.0000 mg | INTRAMUSCULAR | Status: DC | PRN
  Administered 2011-11-08 – 2011-11-09 (×4): 1 mg via INTRAVENOUS
  Filled 2011-11-08 (×4): qty 1

## 2011-11-08 MED ORDER — INSULIN ASPART 100 UNIT/ML ~~LOC~~ SOLN
0.0000 [IU] | Freq: Every day | SUBCUTANEOUS | Status: DC
  Administered 2011-11-09: 2 [IU] via SUBCUTANEOUS

## 2011-11-08 MED ORDER — HALOPERIDOL LACTATE 5 MG/ML IJ SOLN
INTRAMUSCULAR | Status: AC
  Filled 2011-11-08: qty 1

## 2011-11-08 MED ORDER — GLIMEPIRIDE 2 MG PO TABS
2.0000 mg | ORAL_TABLET | Freq: Every day | ORAL | Status: DC
  Administered 2011-11-08 – 2011-11-10 (×3): 2 mg via ORAL
  Filled 2011-11-08 (×3): qty 1

## 2011-11-08 NOTE — ED Notes (Signed)
Patient returned from CT

## 2011-11-08 NOTE — ED Notes (Signed)
MD at bedside. Dr. Le at bedside.  

## 2011-11-08 NOTE — ED Notes (Signed)
MD at bedside.  Dr. King at bedside 

## 2011-11-08 NOTE — ED Notes (Addendum)
Pt. Was gowned, put on a monitor,EKG was performed

## 2011-11-08 NOTE — Progress Notes (Signed)
3:52 PM I agree with HPI/GPe and A/P per Dr. Houston Siren      HEENT alert oriented CHEST clinically clear CARDIAC S1-S2 no murmur rub or gallop ABDOMEN soft nontender nondistended NEURO moving all 4 limbs no specific weakness in lower extremities. Able to control bowel bladder SKIN/MUSCULAR pain in lower back-patient on bedpan, therefore not examined  Patient Active Problem List  Diagnoses  . Fall at home+Fracture lumbar vertebra-Dr. Luiz Blare of orthopedics consulted-patient not a surgical candidate. Patient to have full diet. His pain is significant and will be followed by orthopedics-input appreciated.  Continue pain management. Avoid NSAIDs given history of diverticular bleed. Continue current Dilaudid 1 mg every 4 when necessary. Add Percocet 5/325 one tab every 4 when necessary for moderate pain   .  leukocytosis-white count is 27.1 today. This may likely be secondary to shift secondary to fall. He does not have fever or her white count at present time however if he spikes 1 we will reevaluate him. No need for antibiotics at present   . GOUT-takes chronic prednisone for this and allopurinol 300 mg daily. His prednisone may have weakened his bones. We will get a vitamin D level and reassess and he may need supplementation   . ANXIETY-  . HYPERTENSION-takes Toprol 50 mg 2 times a day. Also Lasix 40 mg. Continue her Toprol 50 twice a day   . GERD-continue Prevacid   .  hyperlipidemia-continue Zocor 20 mg at bedtime   . ABNORMAL ELECTROCARDIOGRAM  . PROSTATE CANCER, HX OF h some concern for possible pathological fracture. Dr. Luiz Blare has reviewed the films and will follow as it was noted there maybe Schmorl's nodule on some of the x-rays. We'll hold a PSA at this time   . DIVERTICULAR BLEEDING, HX OF-history of diverticular bleed 2004. Hemoglobin today is 12.3. Continue Prevacid 50 mg daily   . DIABETES MELLITUS, TYPE II-takes Amaryl 2 mg and Lantus 15 units at bedtime. We will continue his Lantus.  He will continue sliding scale insulin that has been prescribed. His blood sugar ranges between 99 and 410. We will reassess him and his trends in the morning   .    Will discuss plan of care with family in a.m. Have discussed with social worker as patient may need placement  Pleas Koch, MD [facility] [phone]

## 2011-11-08 NOTE — ED Notes (Signed)
Transporter called to take pt upstairs.

## 2011-11-08 NOTE — ED Provider Notes (Signed)
History     CSN: [id number]  Arrival date & time 11/08/11  0231   First MD Initiated Contact with Patient 11/08/11 [phone]      Chief Complaint  Patient presents with  . Fall    (Consider location/radiation/quality/duration/timing/severity/associated sxs/prior treatment) HPI Comments: Mechanical fall while attempting to get a midnight snack. Uses a cane for assistance with walking. States he turned too quickly and fell landing on his left side impact. States he did jar his head slightly. Is not on any blood thinning medications. His had some pain in his back since before the fall. Radiates into his left lower terminate. He has no neurologic symptoms.  Patient is a 78 y.o. male presenting with fall. The history is provided by the patient and a relative. No language interpreter was used.  Fall The accident occurred 1 to 2 hours ago. The fall occurred while walking. He fell from a height of 3 to 5 ft. He landed on a hard floor. There was no blood loss. Point of impact: back. Pain location: back, l leg. The pain is moderate. He was ambulatory at the scene. There was no entrapment after the fall. There was no drug use involved in the accident. Pertinent negatives include no visual change, no fever, no numbness, no abdominal pain, no bowel incontinence, no nausea, no vomiting, no headaches and no loss of consciousness. He has tried nothing for the symptoms.    Past Medical History  Diagnosis Date  . Prostate cancer   . Dementia     No past surgical history on file.  No family history on file.  History  Substance Use Topics  . Smoking status: Not on file  . Smokeless tobacco: Not on file  . Alcohol Use:       Review of Systems  Constitutional: Negative for fever, activity change, appetite change and fatigue.  HENT: Negative for congestion, sore throat, rhinorrhea, neck pain and neck stiffness.   Respiratory: Negative for cough and shortness of breath.   Cardiovascular: Negative for  chest pain and palpitations.  Gastrointestinal: Negative for nausea, vomiting, abdominal pain and bowel incontinence.  Genitourinary: Negative for dysuria, urgency, frequency and flank pain.  Musculoskeletal: Positive for back pain and arthralgias. Negative for myalgias and gait problem.  Neurological: Negative for dizziness, loss of consciousness, weakness, light-headedness, numbness and headaches.  All other systems reviewed and are negative.    Allergies  Aspirin and Ibuprofen  Home Medications   Current Outpatient Rx  Name Route Sig Dispense Refill  . CETIRIZINE HCL 10 MG PO TABS Oral Take 10 mg by mouth every evening.    . FUROSEMIDE 40 MG PO TABS Oral Take 40 mg by mouth daily.    Marland Kitchen GLIMEPIRIDE 2 MG PO TABS Oral Take 2 mg by mouth daily before breakfast.    . HYDROCODONE-ACETAMINOPHEN 5-500 MG PO TABS Oral Take 1 tablet by mouth every 6 (six) hours as needed. Pain    . INSULIN GLARGINE 100 UNIT/ML Victoria SOLN Subcutaneous Inject 15 Units into the skin at bedtime.    Marland Kitchen LANSOPRAZOLE 15 MG PO CPDR Oral Take 15 mg by mouth daily.    Marland Kitchen LORAZEPAM 0.5 MG PO TABS Oral Take 0.5 mg by mouth every 6 (six) hours as needed. Anxiety    . METOPROLOL TARTRATE 50 MG PO TABS Oral Take 50 mg by mouth 2 (two) times daily.    Marland Kitchen POLYSACCHARIDE IRON 150 MG PO CAPS Oral Take 150 mg by mouth daily.    Marland Kitchen  PREDNISONE 10 MG PO TABS Oral Take 30 mg by mouth 2 (two) times daily. Prednisone taper    . RIVASTIGMINE 4.6 MG/24HR TD PT24 Transdermal Place 1 patch onto the skin daily.    Marland Kitchen SIMVASTATIN 20 MG PO TABS Oral Take 20 mg by mouth at bedtime.      BP 174/87  Pulse 71  Temp(Src) 98.5 F (36.9 C) (Oral)  Resp 17  SpO2 99%  Physical Exam  Nursing note and vitals reviewed. Constitutional: He is oriented to person, place, and time. He appears well-developed and well-nourished. No distress.  HENT:  Head: Normocephalic and atraumatic.  Mouth/Throat: Oropharynx is clear and moist.  Eyes: Conjunctivae and  EOM are normal. Pupils are equal, round, and reactive to light.  Neck: Normal range of motion. Neck supple.  Cardiovascular: Normal rate, regular rhythm, normal heart sounds and intact distal pulses.  Exam reveals no gallop and no friction rub.   No murmur heard. Pulmonary/Chest: Effort normal and breath sounds normal. No respiratory distress. He exhibits no tenderness.  Abdominal: Soft. Bowel sounds are normal. There is no tenderness.  Musculoskeletal:       Cervical back: He exhibits normal range of motion, no tenderness, no bony tenderness, no pain and no spasm.       Thoracic back: He exhibits tenderness and pain. He exhibits no bony tenderness and no spasm.       Lumbar back: He exhibits tenderness and pain. He exhibits no bony tenderness and no spasm.  Neurological: He is alert and oriented to person, place, and time. He has normal strength. No cranial nerve deficit or sensory deficit. He displays a negative Romberg sign. Coordination normal. GCS eye subscore is 4. GCS verbal subscore is 5. GCS motor subscore is 6.  Skin: Skin is warm and dry. No rash noted.    ED Course  Procedures (including critical care time)   Date: 11/08/2011  Rate: 74  Rhythm: normal sinus rhythm  QRS Axis: normal  Intervals: PR shortened  ST/T Wave abnormalities: normal  Conduction Disutrbances:none  Narrative Interpretation:   Old EKG Reviewed: unchanged  Labs Reviewed  CBC - Abnormal; Notable for the following:    WBC 27.1 (*)    Hemoglobin 12.3 (*)    HCT 37.3 (*)    All other components within normal limits  DIFFERENTIAL - Abnormal; Notable for the following:    Neutrophils Relative 90 (*)    Lymphocytes Relative 5 (*)    Neutro Abs 24.3 (*)    Monocytes Absolute 1.4 (*)    All other components within normal limits  BASIC METABOLIC PANEL - Abnormal; Notable for the following:    Sodium 132 (*)    Glucose, Bld 410 (*)    BUN 40 (*)    Creatinine, Ser 1.56 (*)    GFR calc non Af Amer 39 (*)     GFR calc Af Amer 46 (*)    All other components within normal limits  URINALYSIS, ROUTINE W REFLEX MICROSCOPIC  HEMOGLOBIN A1C   Dg Chest 2 View  11/08/2011  *RADIOLOGY REPORT*  Clinical Data: Status post fall; upper back pain, radiating to both legs.  CHEST - 2 VIEW  Comparison: Chest radiograph performed 07/06/1999 wall  Findings: The lungs are well-aerated.  Mild left basilar scarring is noted.  There is no evidence of focal opacification, pleural effusion or pneumothorax.  The heart is normal in size; calcification is noted within the aortic arch.  No acute osseous abnormalities are seen.  IMPRESSION: No acute cardiopulmonary process seen.  Original Report Authenticated By: Tonia Ghent, M.D.   Dg Cervical Spine Complete  11/08/2011  *RADIOLOGY REPORT*  Clinical Data: Status post fall; neck pain.  CERVICAL SPINE - COMPLETE 4+ VIEW  Comparison: None.  Findings: There is no evidence of fracture or subluxation. Vertebral bodies demonstrate normal height and alignment.  There is narrowing of the intervertebral disc spaces along the lower cervical spine, with associated anterior and posterior disc osteophyte complexes.  Prevertebral soft tissues are within normal limits.  The provided odontoid view demonstrates no significant abnormality.  The visualized lung apices are clear.  IMPRESSION:  1.  No evidence of fracture or subluxation along the cervical spine. 2.  Mild degenerative change noted along the lower cervical spine.  Original Report Authenticated By: Tonia Ghent, M.D.   Dg Thoracic Spine 2 View  11/08/2011  *RADIOLOGY REPORT*  Clinical Data: Status post fall, with upper back pain, radiating to both legs.  THORACIC SPINE - 2 VIEW  Comparison: MRI of the thoracic spine performed 04/06/2009, and chest radiograph performed earlier today at 04:22 a.m.  Findings: There is no evidence of fracture or subluxation. Vertebral bodies demonstrate normal height and alignment. Intervertebral disc spaces  are preserved, aside from degenerative change along the lower cervical spine.  The visualized portions of both lungs are clear.  The mediastinum is unremarkable in appearance.  IMPRESSION:  1.  No evidence of fracture or subluxation along the thoracic spine. 2.  Mild degenerative change noted along the lower cervical spine.  Original Report Authenticated By: Tonia Ghent, M.D.   Dg Lumbar Spine Complete  11/08/2011  *RADIOLOGY REPORT*  Clinical Data: Status post fall; upper back pain radiating down the back, extending to both legs.  LUMBAR SPINE - COMPLETE 4+ VIEW  Comparison: Lumbar spine radiographs performed 03/20/2011  Findings: There appears to be an acute fracture involving the anterior aspect of vertebral body L1, with disruption of the inferior endplate and a fracture extending to the superior endplate; there is minimal loss of vertebral body height.  No definite retropulsion is seen.  No additional fractures are identified.  There is mild loss of the intervertebral disc spaces along the mid lumbar spine, with associated mild degenerative change.  Mild lateral osteophytes are noted along the lumbar spine.  Mild degenerative change is noted at the lower lumbar spine.  The visualized bowel gas pattern is unremarkable in appearance; air and stool are noted within the colon.  The sacroiliac joints are within normal limits.  IMPRESSION: Acute fracture involving the anterior aspect of vertebral body L1, with involvement of both endplates and minimal loss of vertebral body height.  No definite evidence for retropulsion.  Original Report Authenticated By: Tonia Ghent, M.D.   Ct Head Wo Contrast  11/08/2011  *RADIOLOGY REPORT*  Clinical Data: Status post fall; concern for head injury.  CT HEAD WITHOUT CONTRAST  Technique:  Contiguous axial images were obtained from the base of the skull through the vertex without contrast.  Comparison: CT of the head performed 07/06/2011  Findings: There is no evidence of  acute infarction, mass lesion, or intra- or extra-axial hemorrhage on CT.  Prominence of the ventricles and sulci reflects moderate cortical volume loss.  Cerebellar atrophy is noted.  Scattered periventricular and subcortical white matter change likely reflects small vessel ischemic microangiopathy.  The brainstem and fourth ventricle are within normal limits.  The basal ganglia are unremarkable in appearance.  The cerebral hemispheres demonstrate grossly normal gray-white differentiation.  No mass effect or midline shift is seen.  There is no evidence of fracture; degenerative change is noted about the dens.  The visualized portions of the orbits are within normal limits.  The paranasal sinuses and mastoid air cells are well-aerated.  No significant soft tissue abnormalities are seen. Cerumen is noted within both external auditory canals.  IMPRESSION:  1.  No evidence of traumatic intracranial injury or fracture. 2.  Moderate cortical volume loss and scattered small vessel ischemic microangiopathy. 3.  Cerumen noted within both external auditory canals.  Original Report Authenticated By: Tonia Ghent, M.D.   Ct Lumbar Spine Wo Contrast  11/08/2011  *RADIOLOGY REPORT*  Clinical Data: Status post fall; lower back pain.  L1 fracture noted on radiograph; request further evaluation.  CT LUMBAR SPINE WITHOUT CONTRAST  Technique:  Multidetector CT imaging of the lumbar spine was performed without intravenous contrast administration. Multiplanar CT image reconstructions were also generated.  Comparison: Lumbar spine radiographs performed earlier today at 04:33 a.m.  Findings: There is an acute oblique fracture extending across the body of L1, with approximately 60% loss of height.  The fracture extends through both the superior and inferior endplates, but does not appear to involve the posterior aspect of the vertebral body. There is no evidence of retropulsion.  Multiple mildly displaced anterior fragments are noted,  more prominent on the right side.  No significant surrounding hematoma is identified.  The surrounding intervertebral disc spaces appear intact.  There is chronic endplate degenerative change at L2-L3, with associated sclerosis and apparent large Schmorl's nodes.  Circumferential disc bulges are noted at L3-L4, L4-L5 and L5-S1, with associated osteophytes. No additional fractures are seen.  Mild facet disease is noted along the lumbar spine.  Nonspecific perinephric stranding is noted bilaterally.  Scattered calcification is noted along the abdominal aorta and its branches. Scattered diverticulosis is noted along the visualized portions of the colon.  IMPRESSION:  1.  Acute oblique fracture extending across vertebral body L1, with 60% loss of height.  This involves both the superior and inferior endplates, but does not appear to involve the posterior aspect of the vertebral body.  No evidence of retropulsion.  Mildly displaced anterior fragments are more prominent on the right. 2.  Mild degenerative change noted along the lumbar spine. 3.  Scattered calcification along the abdominal aorta and its branches. 4.  Scattered diverticulosis along the visualized portions of the colon.  Original Report Authenticated By: Tonia Ghent, M.D.     1. Fracture of vertebra, lumbar   2. Diabetes mellitus   3. HYPERTENSION       MDM  Fall from standing. Mechanical fall. There is no concern about a syncopal event. He has acute back pain with presence of an acute L1 fracture. He has no neurologic compromise. He'll be admitted for pain control. His leukocytosis is secondary to steroids as this is elevated blood glucose.        Dayton Bailiff, MD 11/08/11 0700

## 2011-11-08 NOTE — ED Notes (Signed)
Pt fell from standing position in kitchen. No LOC. Pt denies syncopal episode. C/o pain to lower back. Pt is normal to baseline.

## 2011-11-08 NOTE — ED Notes (Signed)
Adolph, pt's son, sts they are going downstairs to get something to eat and would like to be called if MD comes while they are gone. [phone]

## 2011-11-08 NOTE — ED Notes (Signed)
Ortho MD at bedside.

## 2011-11-08 NOTE — ED Notes (Signed)
Pts daughter leaving to pick up family member, is to return. Daughter is pts POA.

## 2011-11-08 NOTE — ED Notes (Signed)
Patient transported to CT 

## 2011-11-08 NOTE — ED Notes (Signed)
WUJ:[id number]<JY> Expected date:11/08/11<BR> Expected time: 2:15 AM<BR> Means of arrival:Ambulance<BR> Comments:<BR> Fall from standing position

## 2011-11-08 NOTE — ED Notes (Signed)
Pt states he was in the kitchen and turned around and lost his balance. Denies LOC. Pt states his mid back is in pain. Pt states he "jarred himself". Pt a/o x 3 to normal baseline per family.

## 2011-11-08 NOTE — Consult Note (Signed)
Reason for Consult:low back pain Referring Physician: hospitalists  [name] is an 78 y.o. male.  HPI: 78 yo male who fell yesterday and c/o low back pain.  He is admitted to int med service for pain control and mobilization.  Past Medical History  Diagnosis Date  . Prostate cancer   . Dementia   . Diabetes mellitus   . Hypertension   . Gout     Past Surgical History  Procedure Date  . Left leg surgery   . Right shoulder surgery   . Left cataract extraction     History reviewed. No pertinent family history.  Social History:  reports that he has quit smoking. His smoking use included Cigarettes. He has never used smokeless tobacco. He reports that he does not drink alcohol or use illicit drugs.  Allergies:  Allergies  Allergen Reactions  . Aspirin   . Ibuprofen     Medications: I have reviewed the patient's current medications.  Results for orders placed during the hospital encounter of 11/08/11 (from the past 48 hour(s))  CBC     Status: Abnormal   Collection Time   11/08/11  4:05 AM      Component Value Range Comment   WBC 27.1 (*) 4.0 - 10.5 (K/uL)    RBC 4.29  4.22 - 5.81 (MIL/uL)    Hemoglobin 12.3 (*) 13.0 - 17.0 (g/dL)    HCT 47.8 (*) 29.5 - 52.0 (%)    MCV 86.9  78.0 - 100.0 (fL)    MCH 28.7  26.0 - 34.0 (pg)    MCHC 33.0  30.0 - 36.0 (g/dL)    RDW 62.1  30.8 - 65.7 (%)    Platelets 351  150 - 400 (K/uL)   DIFFERENTIAL     Status: Abnormal   Collection Time   11/08/11  4:05 AM      Component Value Range Comment   Neutrophils Relative 90 (*) 43 - 77 (%)    Lymphocytes Relative 5 (*) 12 - 46 (%)    Monocytes Relative 5  3 - 12 (%)    Eosinophils Relative 0  0 - 5 (%)    Basophils Relative 0  0 - 1 (%)    Neutro Abs 24.3 (*) 1.7 - 7.7 (K/uL)    Lymphs Abs 1.4  0.7 - 4.0 (K/uL)    Monocytes Absolute 1.4 (*) 0.1 - 1.0 (K/uL)    Eosinophils Absolute 0.0  0.0 - 0.7 (K/uL)    Basophils Absolute 0.0  0.0 - 0.1 (K/uL)    WBC Morphology MILD LEFT SHIFT  (1-5% METAS, OCC MYELO, OCC BANDS)      Smear Review PLATELET CLUMPS NOTED ON SMEAR     BASIC METABOLIC PANEL     Status: Abnormal   Collection Time   11/08/11  4:05 AM      Component Value Range Comment   Sodium 132 (*) 135 - 145 (mEq/L)    Potassium 4.2  3.5 - 5.1 (mEq/L)    Chloride 97  96 - 112 (mEq/L)    CO2 22  19 - 32 (mEq/L)    Glucose, Bld 410 (*) 70 - 99 (mg/dL)    BUN 40 (*) 6 - 23 (mg/dL)    Creatinine, Ser 8.46 (*) 0.50 - 1.35 (mg/dL)    Calcium 96.2  8.4 - 10.5 (mg/dL)    GFR calc non Af Amer 39 (*) >90 (mL/min)    GFR calc Af Amer 46 (*) >90 (mL/min)  HEMOGLOBIN A1C     Status: Abnormal   Collection Time   11/08/11  4:05 AM      Component Value Range Comment   Hemoglobin A1C 11.9 (*) <5.7 (%)    Mean Plasma Glucose 295 (*) <117 (mg/dL)   URINALYSIS, ROUTINE W REFLEX MICROSCOPIC     Status: Abnormal   Collection Time   11/08/11  6:20 AM      Component Value Range Comment   Color, Urine YELLOW  YELLOW     APPearance CLEAR  CLEAR     Specific Gravity, Urine 1.019  1.005 - 1.030     pH 6.0  5.0 - 8.0     Glucose, UA 500 (*) NEGATIVE (mg/dL)    Hgb urine dipstick SMALL (*) NEGATIVE     Bilirubin Urine NEGATIVE  NEGATIVE     Ketones, ur NEGATIVE  NEGATIVE (mg/dL)    Protein, ur 161 (*) NEGATIVE (mg/dL)    Urobilinogen, UA 0.2  0.0 - 1.0 (mg/dL)    Nitrite NEGATIVE  NEGATIVE     Leukocytes, UA NEGATIVE  NEGATIVE    URINE MICROSCOPIC-ADD ON     Status: Abnormal   Collection Time   11/08/11  6:20 AM      Component Value Range Comment   Squamous Epithelial / LPF RARE  RARE     WBC, UA 0-2  <3 (WBC/hpf)    RBC / HPF 0-2  <3 (RBC/hpf)    Bacteria, UA RARE  RARE     Casts HYALINE CASTS (*) NEGATIVE    GLUCOSE, CAPILLARY     Status: Abnormal   Collection Time   11/08/11  8:32 AM      Component Value Range Comment   Glucose-Capillary 210 (*) 70 - 99 (mg/dL)   GLUCOSE, CAPILLARY     Status: Normal   Collection Time   11/08/11 11:53 AM      Component Value Range  Comment   Glucose-Capillary 99  70 - 99 (mg/dL)   GLUCOSE, CAPILLARY     Status: Abnormal   Collection Time   11/08/11  4:25 PM      Component Value Range Comment   Glucose-Capillary 132 (*) 70 - 99 (mg/dL)     Dg Chest 2 View  0/96/0454  *RADIOLOGY REPORT*  Clinical Data: Status post fall; upper back pain, radiating to both legs.  CHEST - 2 VIEW  Comparison: Chest radiograph performed 07/06/1999 wall  Findings: The lungs are well-aerated.  Mild left basilar scarring is noted.  There is no evidence of focal opacification, pleural effusion or pneumothorax.  The heart is normal in size; calcification is noted within the aortic arch.  No acute osseous abnormalities are seen.  IMPRESSION: No acute cardiopulmonary process seen.  Original Report Authenticated By: Tonia Ghent, M.D.   Dg Cervical Spine Complete  11/08/2011  *RADIOLOGY REPORT*  Clinical Data: Status post fall; neck pain.  CERVICAL SPINE - COMPLETE 4+ VIEW  Comparison: None.  Findings: There is no evidence of fracture or subluxation. Vertebral bodies demonstrate normal height and alignment.  There is narrowing of the intervertebral disc spaces along the lower cervical spine, with associated anterior and posterior disc osteophyte complexes.  Prevertebral soft tissues are within normal limits.  The provided odontoid view demonstrates no significant abnormality.  The visualized lung apices are clear.  IMPRESSION:  1.  No evidence of fracture or subluxation along the cervical spine. 2.  Mild degenerative change noted along the lower cervical spine.  Original Report  Authenticated By: Tonia Ghent, M.D.   Dg Thoracic Spine 2 View  11/08/2011  *RADIOLOGY REPORT*  Clinical Data: Status post fall, with upper back pain, radiating to both legs.  THORACIC SPINE - 2 VIEW  Comparison: MRI of the thoracic spine performed 04/06/2009, and chest radiograph performed earlier today at 04:22 a.m.  Findings: There is no evidence of fracture or subluxation.  Vertebral bodies demonstrate normal height and alignment. Intervertebral disc spaces are preserved, aside from degenerative change along the lower cervical spine.  The visualized portions of both lungs are clear.  The mediastinum is unremarkable in appearance.  IMPRESSION:  1.  No evidence of fracture or subluxation along the thoracic spine. 2.  Mild degenerative change noted along the lower cervical spine.  Original Report Authenticated By: Tonia Ghent, M.D.   Dg Lumbar Spine Complete  11/08/2011  *RADIOLOGY REPORT*  Clinical Data: Status post fall; upper back pain radiating down the back, extending to both legs.  LUMBAR SPINE - COMPLETE 4+ VIEW  Comparison: Lumbar spine radiographs performed 03/20/2011  Findings: There appears to be an acute fracture involving the anterior aspect of vertebral body L1, with disruption of the inferior endplate and a fracture extending to the superior endplate; there is minimal loss of vertebral body height.  No definite retropulsion is seen.  No additional fractures are identified.  There is mild loss of the intervertebral disc spaces along the mid lumbar spine, with associated mild degenerative change.  Mild lateral osteophytes are noted along the lumbar spine.  Mild degenerative change is noted at the lower lumbar spine.  The visualized bowel gas pattern is unremarkable in appearance; air and stool are noted within the colon.  The sacroiliac joints are within normal limits.  IMPRESSION: Acute fracture involving the anterior aspect of vertebral body L1, with involvement of both endplates and minimal loss of vertebral body height.  No definite evidence for retropulsion.  Original Report Authenticated By: Tonia Ghent, M.D.   Ct Head Wo Contrast  11/08/2011  *RADIOLOGY REPORT*  Clinical Data: Status post fall; concern for head injury.  CT HEAD WITHOUT CONTRAST  Technique:  Contiguous axial images were obtained from the base of the skull through the vertex without contrast.   Comparison: CT of the head performed 07/06/2011  Findings: There is no evidence of acute infarction, mass lesion, or intra- or extra-axial hemorrhage on CT.  Prominence of the ventricles and sulci reflects moderate cortical volume loss.  Cerebellar atrophy is noted.  Scattered periventricular and subcortical white matter change likely reflects small vessel ischemic microangiopathy.  The brainstem and fourth ventricle are within normal limits.  The basal ganglia are unremarkable in appearance.  The cerebral hemispheres demonstrate grossly normal gray-white differentiation. No mass effect or midline shift is seen.  There is no evidence of fracture; degenerative change is noted about the dens.  The visualized portions of the orbits are within normal limits.  The paranasal sinuses and mastoid air cells are well-aerated.  No significant soft tissue abnormalities are seen. Cerumen is noted within both external auditory canals.  IMPRESSION:  1.  No evidence of traumatic intracranial injury or fracture. 2.  Moderate cortical volume loss and scattered small vessel ischemic microangiopathy. 3.  Cerumen noted within both external auditory canals.  Original Report Authenticated By: Tonia Ghent, M.D.   Ct Lumbar Spine Wo Contrast  11/08/2011  *RADIOLOGY REPORT*  Clinical Data: Status post fall; lower back pain.  L1 fracture noted on radiograph; request further evaluation.  CT LUMBAR SPINE  WITHOUT CONTRAST  Technique:  Multidetector CT imaging of the lumbar spine was performed without intravenous contrast administration. Multiplanar CT image reconstructions were also generated.  Comparison: Lumbar spine radiographs performed earlier today at 04:33 a.m.  Findings: There is an acute oblique fracture extending across the body of L1, with approximately 60% loss of height.  The fracture extends through both the superior and inferior endplates, but does not appear to involve the posterior aspect of the vertebral body. There is no  evidence of retropulsion.  Multiple mildly displaced anterior fragments are noted, more prominent on the right side.  No significant surrounding hematoma is identified.  The surrounding intervertebral disc spaces appear intact.  There is chronic endplate degenerative change at L2-L3, with associated sclerosis and apparent large Schmorl's nodes.  Circumferential disc bulges are noted at L3-L4, L4-L5 and L5-S1, with associated osteophytes. No additional fractures are seen.  Mild facet disease is noted along the lumbar spine.  Nonspecific perinephric stranding is noted bilaterally.  Scattered calcification is noted along the abdominal aorta and its branches. Scattered diverticulosis is noted along the visualized portions of the colon.  IMPRESSION:  1.  Acute oblique fracture extending across vertebral body L1, with 60% loss of height.  This involves both the superior and inferior endplates, but does not appear to involve the posterior aspect of the vertebral body.  No evidence of retropulsion.  Mildly displaced anterior fragments are more prominent on the right. 2.  Mild degenerative change noted along the lumbar spine. 3.  Scattered calcification along the abdominal aorta and its branches. 4.  Scattered diverticulosis along the visualized portions of the colon.  Original Report Authenticated By: Tonia Ghent, M.D.    ROS: No posative responses as relates to HPI EXAM: Blood pressure 157/60, pulse 69, temperature 98 F (36.7 C), temperature source Oral, resp. rate 20, SpO2 96.00%.  WDWN male NAD Eyes not dilated Not using accessory muscles of respiration The rashes on the exposed skin She is somewhat demented and unable to give an accurate history Lower extremity exam shows normal strength sensation and reflexes.  Assessment/Plan: 78 year old male with a fall and new onset L1 compression fracture 60% anterior no posterior retropulsion.  There are findings at the L2-3 level compatible with Schmorl's  node./patient will be admitted via medicine.  I should may be up ad lib.  If he fails getting up, he may need a brace.  Will await his trial of getting up prior to bracing.  He is likely to need and nursing home stay.  We will follow while he is in the hospital. Jakim Drapeau L 11/08/2011, 5:43 PM

## 2011-11-08 NOTE — H&P (Signed)
PCP:   Willow Ora, MD, MD   Chief Complaint: [name] and hurt his back.   HPI: [name] is an 78 y.o. male with history of dementia, history of prostate cancer status post prostatectomy, diabetes, who lives at home with good family support, fell last night as he was going to the bathroom (mechanical fall), no loss of consciousness, presents to the emergency room because he has low back pain. Apparently he has a history of low back pain and was recently placed on steroid. Evaluation in emergency room showed a white count of 27,000, blood glucose of 400, creatinine 1.5, and plain x-ray of his lumbosacral area shows acute fracture of L1 involving both endplates, minimal loss of vertebral height, and no retropulsion. He denied any weakness of the lower extremity, bowel bladder incontinence, but unable to ambulate because of the pain.   Hospitalist was asked to admit the patient for further evaluation.  Rewiew of Systems:  The patient denies anorexia, fever, weight loss,, vision loss, decreased hearing, hoarseness, chest pain, syncope, dyspnea on exertion, peripheral edema, balance deficits, hemoptysis, abdominal pain, melena, hematochezia, severe indigestion/heartburn, hematuria, incontinence, genital sores, muscle weakness, suspicious skin lesions, transient blindness, difficulty walking, depression, unusual weight change, abnormal bleeding, enlarged lymph nodes, angioedema, and breast masses.    Past Medical History  Diagnosis Date  . Prostate cancer   . Dementia     No past surgical history on file.  Medications:  HOME MEDS: Prior to Admission medications   Medication Sig Start Date End Date Taking? Authorizing Provider  cetirizine (ZYRTEC) 10 MG tablet Take 10 mg by mouth every evening.   Yes Historical Provider, MD  furosemide (LASIX) 40 MG tablet Take 40 mg by mouth daily.   Yes Historical Provider, MD  glimepiride (AMARYL) 2 MG tablet Take 2 mg by mouth daily before breakfast.   Yes  Historical Provider, MD  HYDROcodone-acetaminophen (VICODIN) 5-500 MG per tablet Take 1 tablet by mouth every 6 (six) hours as needed. Pain   Yes Historical Provider, MD  insulin glargine (LANTUS) 100 UNIT/ML injection Inject 15 Units into the skin at bedtime.   Yes Historical Provider, MD  lansoprazole (PREVACID) 15 MG capsule Take 15 mg by mouth daily.   Yes Historical Provider, MD  LORazepam (ATIVAN) 0.5 MG tablet Take 0.5 mg by mouth every 6 (six) hours as needed. Anxiety   Yes Historical Provider, MD  metoprolol (LOPRESSOR) 50 MG tablet Take 50 mg by mouth 2 (two) times daily.   Yes Historical Provider, MD  polysaccharide iron (NIFEREX) 150 MG CAPS capsule Take 150 mg by mouth daily.   Yes Historical Provider, MD  predniSONE (DELTASONE) 10 MG tablet Take 30 mg by mouth 2 (two) times daily. Prednisone taper   Yes Historical Provider, MD  rivastigmine (EXELON) 4.6 mg/24hr Place 1 patch onto the skin daily.   Yes Historical Provider, MD  simvastatin (ZOCOR) 20 MG tablet Take 20 mg by mouth at bedtime.   Yes Historical Provider, MD     Allergies:  Allergies  Allergen Reactions  . Aspirin   . Ibuprofen     Social History:   does not have a smoking history on file. He does not have any smokeless tobacco history on file. His alcohol and drug histories not on file.  Family History: No family history on file.   Physical Exam: Filed Vitals:   11/08/11 0302 11/08/11 0459  BP: 164/79 174/87  Pulse: 71 71  Temp: 98.5 F (36.9 C)  TempSrc: Oral   Resp: 16 16  SpO2: 98% 98%   Blood pressure 174/87, pulse 71, temperature 98.5 F (36.9 C), temperature source Oral, resp. rate 16, SpO2 98.00%.  GEN:  Pleasant  person lying in the stretcher in no acute distress; cooperative with exam PSYCH:  alert and oriented x4; does not appear anxious does not appear depressed; affect is normal HEENT: Mucous membranes pink and anicteric; PERRLA; EOM intact; no cervical lymphadenopathy nor thyromegaly  or carotid bruit; no JVD; Breasts:: Not examined CHEST WALL: No tenderness CHEST: Normal respiration, clear to auscultation bilaterally HEART: Regular rate and rhythm; no murmurs rubs or gallops BACK: Focal tenderness around his low back, with slight paravertebral muscle spasm ABDOMEN: Obese, soft non-tender; no masses, no organomegaly, normal abdominal bowel sounds; no pannus; no intertriginous candida. Rectal Exam: Good rectal tone, absence of prostate gland. EXTREMITIES: No bone or joint deformity; age-appropriate arthropathy of the hands and knees; no edema; no ulcerations. Genitalia: not examined PULSES: 2+ and symmetric SKIN: Normal hydration no rash or ulceration CNS: Cranial nerves 2-12 grossly intact no focal neurologic deficit. The abductor and adductor groups are strong. Dorsiflexion and plantar flexion are 5 over 5 as well. In the reflex on the right 2+, on the left there is no dependent reflex. Sensory are intact bilaterally. Bilateral quads are strong as well.   Labs & Imaging Results for orders placed during the hospital encounter of 11/08/11 (from the past 48 hour(s))  CBC     Status: Abnormal   Collection Time   11/08/11  4:05 AM      Component Value Range Comment   WBC 27.1 (*) 4.0 - 10.5 (K/uL)    RBC 4.29  4.22 - 5.81 (MIL/uL)    Hemoglobin 12.3 (*) 13.0 - 17.0 (g/dL)    HCT 40.9 (*) 81.1 - 52.0 (%)    MCV 86.9  78.0 - 100.0 (fL)    MCH 28.7  26.0 - 34.0 (pg)    MCHC 33.0  30.0 - 36.0 (g/dL)    RDW 91.4  78.2 - 95.6 (%)    Platelets 351  150 - 400 (K/uL)   DIFFERENTIAL     Status: Abnormal   Collection Time   11/08/11  4:05 AM      Component Value Range Comment   Neutrophils Relative 90 (*) 43 - 77 (%)    Lymphocytes Relative 5 (*) 12 - 46 (%)    Monocytes Relative 5  3 - 12 (%)    Eosinophils Relative 0  0 - 5 (%)    Basophils Relative 0  0 - 1 (%)    Neutro Abs 24.3 (*) 1.7 - 7.7 (K/uL)    Lymphs Abs 1.4  0.7 - 4.0 (K/uL)    Monocytes Absolute 1.4 (*) 0.1 -  1.0 (K/uL)    Eosinophils Absolute 0.0  0.0 - 0.7 (K/uL)    Basophils Absolute 0.0  0.0 - 0.1 (K/uL)    WBC Morphology MILD LEFT SHIFT (1-5% METAS, OCC MYELO, OCC BANDS)      Smear Review PLATELET CLUMPS NOTED ON SMEAR     BASIC METABOLIC PANEL     Status: Abnormal   Collection Time   11/08/11  4:05 AM      Component Value Range Comment   Sodium 132 (*) 135 - 145 (mEq/L)    Potassium 4.2  3.5 - 5.1 (mEq/L)    Chloride 97  96 - 112 (mEq/L)    CO2 22  19 - 32 (  mEq/L)    Glucose, Bld 410 (*) 70 - 99 (mg/dL)    BUN 40 (*) 6 - 23 (mg/dL)    Creatinine, Ser 5.62 (*) 0.50 - 1.35 (mg/dL)    Calcium 13.0  8.4 - 10.5 (mg/dL)    GFR calc non Af Amer 39 (*) >90 (mL/min)    GFR calc Af Amer 46 (*) >90 (mL/min)    Dg Chest 2 View  11/08/2011  *RADIOLOGY REPORT*  Clinical Data: Status post fall; upper back pain, radiating to both legs.  CHEST - 2 VIEW  Comparison: Chest radiograph performed 07/06/1999 wall  Findings: The lungs are well-aerated.  Mild left basilar scarring is noted.  There is no evidence of focal opacification, pleural effusion or pneumothorax.  The heart is normal in size; calcification is noted within the aortic arch.  No acute osseous abnormalities are seen.  IMPRESSION: No acute cardiopulmonary process seen.  Original Report Authenticated By: Tonia Ghent, M.D.   Dg Cervical Spine Complete  11/08/2011  *RADIOLOGY REPORT*  Clinical Data: Status post fall; neck pain.  CERVICAL SPINE - COMPLETE 4+ VIEW  Comparison: None.  Findings: There is no evidence of fracture or subluxation. Vertebral bodies demonstrate normal height and alignment.  There is narrowing of the intervertebral disc spaces along the lower cervical spine, with associated anterior and posterior disc osteophyte complexes.  Prevertebral soft tissues are within normal limits.  The provided odontoid view demonstrates no significant abnormality.  The visualized lung apices are clear.  IMPRESSION:  1.  No evidence of fracture or  subluxation along the cervical spine. 2.  Mild degenerative change noted along the lower cervical spine.  Original Report Authenticated By: Tonia Ghent, M.D.   Dg Thoracic Spine 2 View  11/08/2011  *RADIOLOGY REPORT*  Clinical Data: Status post fall, with upper back pain, radiating to both legs.  THORACIC SPINE - 2 VIEW  Comparison: MRI of the thoracic spine performed 04/06/2009, and chest radiograph performed earlier today at 04:22 a.m.  Findings: There is no evidence of fracture or subluxation. Vertebral bodies demonstrate normal height and alignment. Intervertebral disc spaces are preserved, aside from degenerative change along the lower cervical spine.  The visualized portions of both lungs are clear.  The mediastinum is unremarkable in appearance.  IMPRESSION:  1.  No evidence of fracture or subluxation along the thoracic spine. 2.  Mild degenerative change noted along the lower cervical spine.  Original Report Authenticated By: Tonia Ghent, M.D.   Dg Lumbar Spine Complete  11/08/2011  *RADIOLOGY REPORT*  Clinical Data: Status post fall; upper back pain radiating down the back, extending to both legs.  LUMBAR SPINE - COMPLETE 4+ VIEW  Comparison: Lumbar spine radiographs performed 03/20/2011  Findings: There appears to be an acute fracture involving the anterior aspect of vertebral body L1, with disruption of the inferior endplate and a fracture extending to the superior endplate; there is minimal loss of vertebral body height.  No definite retropulsion is seen.  No additional fractures are identified.  There is mild loss of the intervertebral disc spaces along the mid lumbar spine, with associated mild degenerative change.  Mild lateral osteophytes are noted along the lumbar spine.  Mild degenerative change is noted at the lower lumbar spine.  The visualized bowel gas pattern is unremarkable in appearance; air and stool are noted within the colon.  The sacroiliac joints are within normal limits.   IMPRESSION: Acute fracture involving the anterior aspect of vertebral body L1, with involvement of both endplates  and minimal loss of vertebral body height.  No definite evidence for retropulsion.  Original Report Authenticated By: Tonia Ghent, M.D.   Ct Head Wo Contrast  11/08/2011  *RADIOLOGY REPORT*  Clinical Data: Status post fall; concern for head injury.  CT HEAD WITHOUT CONTRAST  Technique:  Contiguous axial images were obtained from the base of the skull through the vertex without contrast.  Comparison: CT of the head performed 07/06/2011  Findings: There is no evidence of acute infarction, mass lesion, or intra- or extra-axial hemorrhage on CT.  Prominence of the ventricles and sulci reflects moderate cortical volume loss.  Cerebellar atrophy is noted.  Scattered periventricular and subcortical white matter change likely reflects small vessel ischemic microangiopathy.  The brainstem and fourth ventricle are within normal limits.  The basal ganglia are unremarkable in appearance.  The cerebral hemispheres demonstrate grossly normal gray-white differentiation. No mass effect or midline shift is seen.  There is no evidence of fracture; degenerative change is noted about the dens.  The visualized portions of the orbits are within normal limits.  The paranasal sinuses and mastoid air cells are well-aerated.  No significant soft tissue abnormalities are seen. Cerumen is noted within both external auditory canals.  IMPRESSION:  1.  No evidence of traumatic intracranial injury or fracture. 2.  Moderate cortical volume loss and scattered small vessel ischemic microangiopathy. 3.  Cerumen noted within both external auditory canals.  Original Report Authenticated By: Tonia Ghent, M.D.      Assessment Present on Admission:  .Fracture lumbar vertebra-closed .DIABETES MELLITUS, TYPE II .HYPERTENSION .HYPERLIPIDEMIA   PLAN: The vertebral fracture does not have significant loss of height and thus I do  not think he needs vertebral plasty or kyphoplasty. We'll admit him for pain control. Please consult orthopedics for further recommendation. He is getting the CT scan of his lumbar area but I suspect it will not be any other pathological process involvement. I do not think that prostate cancer is involved here. We'll continue his medications, including his insulin. He is stable, full code, and will be admitted to [facility] hospitalist service. Other plans as per orders.    Cecilee Rosner 11/08/2011, 6:19 AM

## 2011-11-08 NOTE — ED Notes (Signed)
Pt c/o pain 9/10. Medicated per prn orders. Family at bedside. VSS. No other needs/concerns voiced. Will continue to monitor. Awaiting bed assignment, pt family updated.

## 2011-11-08 NOTE — ED Notes (Signed)
Patient transported to X-ray 

## 2011-11-09 ENCOUNTER — Observation Stay (HOSPITAL_COMMUNITY): Payer: Medicare Other

## 2011-11-09 LAB — CBC
Hemoglobin: 11 g/dL — ABNORMAL LOW (ref 13.0–17.0)
MCHC: 32.2 g/dL (ref 30.0–36.0)
RDW: 13.4 % (ref 11.5–15.5)

## 2011-11-09 LAB — BASIC METABOLIC PANEL
BUN: 31 mg/dL — ABNORMAL HIGH (ref 6–23)
GFR calc Af Amer: 47 mL/min — ABNORMAL LOW (ref >90)
GFR calc non Af Amer: 41 mL/min — ABNORMAL LOW (ref >90)
Potassium: 3.8 mEq/L (ref 3.5–5.1)
Sodium: 137 mEq/L (ref 135–145)

## 2011-11-09 LAB — GLUCOSE, CAPILLARY: Glucose-Capillary: 231 mg/dL — ABNORMAL HIGH (ref 70–99)

## 2011-11-09 MED ORDER — GADOBENATE DIMEGLUMINE 529 MG/ML IV SOLN
16.0000 mL | Freq: Once | INTRAVENOUS | Status: AC | PRN
  Administered 2011-11-09: 16 mL via INTRAVENOUS

## 2011-11-09 MED ORDER — FUROSEMIDE 40 MG PO TABS: 40.0000 mg | ORAL_TABLET | ORAL | Status: DC

## 2011-11-09 MED ORDER — OXYCODONE-ACETAMINOPHEN 5-325 MG PO TABS
1.0000 | ORAL_TABLET | ORAL | Status: DC | PRN
  Administered 2011-11-09 – 2011-11-10 (×2): 1 via ORAL
  Filled 2011-11-09 (×2): qty 1

## 2011-11-09 NOTE — Progress Notes (Signed)
1400  Pt plan to dc home with family. Spoke with pt, son Nell Range and [name] concerning discharge plans.  They will take pt home. Pt selected [facility] related to [facility] using [facility]. Pt will discharge in am with [facility], PT, DME Hospital Bed Trapeze bar to home.  mp

## 2011-11-09 NOTE — Evaluation (Signed)
Physical Therapy Evaluation Patient Details Name: [name] MRN: [id number] DOB: [birth year] Today's Date: 11/09/2011  Problem List:  Patient Active Problem List  Diagnoses  . DIABETES MELLITUS, TYPE II  . HYPERLIPIDEMIA  . GOUT  . ANXIETY  . HYPERTENSION  . GERD  . OSTEOARTHRITIS  . ABNORMAL ELECTROCARDIOGRAM  . PROSTATE CANCER, HX OF  . Divertic bleed 2004-sees [facility]  . Fracture lumbar vertebra-close, non-surgical  . Fall at home    Past Medical History:  Past Medical History  Diagnosis Date  . Prostate cancer   . Dementia   . Diabetes mellitus   . Hypertension   . Gout    Past Surgical History:  Past Surgical History  Procedure Date  . Left leg surgery   . Right shoulder surgery   . Left cataract extraction     PT Assessment/Plan/Recommendation PT Assessment Clinical Impression Statement: Pt presents s/p L1 compression fracture without surgical procedure with decreased overall strength and mobility.  Upon beginning ambulation, pt states "Can I have the urinal, I need to use the bathroom," however was unable to hold urine and had urinary incontinence.  Pt and family state that this is new issue and MD is aware.  PT spoke with MD about findings, MD to order MRI.  PT recommended SNF, however pt and family refuse and state that they will be taking him home and they have enough family support.  Pt will benefit from skilled PT in order to address deficits.   PT Recommendation/Assessment: Patient will need skilled PT in the acute care venue PT Problem List: Decreased strength;Decreased mobility;Decreased coordination;Decreased safety awareness;Decreased knowledge of use of DME;Pain;Decreased activity tolerance Barriers to Discharge: None PT Therapy Diagnosis : Difficulty walking;Abnormality of gait;Generalized weakness;Acute pain PT Plan PT Frequency: Min 3X/week PT Treatment/Interventions: DME instruction;Gait training;Functional mobility training;Therapeutic  activities;Therapeutic exercise;Patient/family education;Balance training PT Recommendation Follow Up Recommendations: Home health PT;Supervision/Assistance - 24 hour Equipment Recommended: Rolling walker with 5" wheels PT Goals  Acute Rehab PT Goals PT Goal Formulation: With patient Time For Goal Achievement: 2 weeks Pt will Roll Supine to Left Side: with supervision PT Goal: Rolling Supine to Left Side - Progress: Goal set today Pt will go Supine/Side to Sit: with supervision PT Goal: Supine/Side to Sit - Progress: Goal set today Pt will go Sit to Supine/Side: with supervision PT Goal: Sit to Supine/Side - Progress: Goal set today Pt will go Sit to Stand: with supervision PT Goal: Sit to Stand - Progress: Goal set today Pt will go Stand to Sit: with supervision PT Goal: Stand to Sit - Progress: Goal set today Pt will Ambulate: 51 - 150 feet;with supervision;with least restrictive assistive device PT Goal: Ambulate - Progress: Goal set today  PT Evaluation Precautions/Restrictions  Precautions Precautions: Fall Prior Functioning  Home Living Lives With: Family Receives Help From: Family Type of Home: Apartment Home Layout: One level Home Access: Ramped entrance Home Adaptive Equipment: Walker - rolling;Bedside commode/3-in-1 Prior Function Level of Independence: Independent with basic ADLs;Requires assistive device for independence;Independent with gait Driving: No Cognition Cognition Arousal/Alertness: Awake/alert Overall Cognitive Status: History of cognitive impairments History of Cognitive Impairment: Appears at baseline functioning Orientation Level: Oriented to person;Oriented to situation;Oriented to time Sensation/Coordination Sensation Light Touch: Appears Intact Coordination Gross Motor Movements are Fluid and Coordinated: No Coordination and Movement Description: Pt seemed slightly ataxic, however unable to formally assess due to urinary incontinence.    Extremity Assessment RLE Assessment RLE Assessment: Within Functional Limits LLE Assessment LLE Assessment: Exceptions to  WFL LLE Strength LLE Overall Strength Comments: Grossly WFL, with hip flex slightly weaker on LLE (3+/5).   Pt required cues to not compensate with backwards trunk lean.  Mobility (including Balance) Bed Mobility Bed Mobility: No (Pt was already sitting EOB. ) Transfers Transfers: Yes Sit to Stand: 1: +2 Total assist;Patient percentage (comment);From elevated surface;With upper extremity assist;From bed Sit to Stand Details (indicate cue type and reason): Pt assist 60%.  +2 for safety and line management.  Cues for hand placement on bed before standing.  Stand to Sit: 1: +2 Total assist;Patient percentage (comment);With upper extremity assist;With armrests;To chair/3-in-1 Stand to Sit Details: Pt assist 70%.  +2 for safety with manual and verbal cues for hand placement on arm rests before sitting and cues for maintaining upright posture when sitting.  Ambulation/Gait Ambulation/Gait: Yes Ambulation/Gait Assistance: 1: +2 Total assist;Patient percentage (comment) Ambulation/Gait Assistance Details (indicate cue type and reason): Pt assist 60%.  +2 for safety and line management.  Provided cues for sequencing/technique with pt tendency to step too far inside RW.   Ambulation Distance (Feet): 10 Feet Assistive device: Rolling walker Gait Pattern: Step-through pattern;Ataxic Gait velocity: decreased Stairs: No Wheelchair Mobility Wheelchair Mobility: No    Exercise    End of Session PT - End of Session Equipment Utilized During Treatment: Gait belt Activity Tolerance: Other (comment) (Limited by incontinent episode) Patient left: in chair;with call bell in reach General Behavior During Session: [facility] for tasks performed Cognition: [facility] for tasks performed  Page, [name] 11/09/2011, 3:27 PM

## 2011-11-09 NOTE — Progress Notes (Signed)
CSW spoke with patients daughter, [name], Discussed probable need for SNF. She states that patient will not be going to a SNF. She states that patient will be going home. She states that they have plenty of support. She states that they will need home health. CSW communicated same to Case manager. No other CSW needs noted. Barbara Keng C. Bedie Dominey MSW, LCSW [phone]

## 2011-11-09 NOTE — Progress Notes (Signed)
OT Cancellation Note  Treatment cancelled today due to patient receiving procedure or test - pt. Gone for MRI.  Will re-attempt.  [name] M 11/09/2011, 2:56 PM

## 2011-11-09 NOTE — Progress Notes (Signed)
PROGRESS NOTE  [name] ZOX:[id number] DOB: 1926-08-27 DOA: 11/08/2011 PCP: Willow Ora, MD, MD  Brief narrative: 78 year old male admitted to 11/08/11 with a mechanical fall on going to bathroom.  Past medical history: History of low back pain recently placed on steroids, dementia, prostate cancer status post prostatectomy-recently seen by Dr. Retta Diones about 3 weeks ago  Consultants:  Orthopedics, Dr. Luiz Blare  Procedures:  Lumbar x-ray 2/21 = acute fracture involving anterior aspect vertebral body L1 with both endplate involvement loss of vertebral height   Chest x-ray two-view 2/21 = no acute cardiac process  CT scan 10/2111 = acute opiate fracture extending across vertebral body L1 with 6% loss of height this does involve bolus.. His discomfort in both posterior aspect of her body no evidence of retropulsion, mild showed changes lower lumbar spine, scattered consultation lung abdominal aorta, scattered diverticulosis  Antibiotics:  None   Subjective  Back pain still present. Patient is incontinent of urine on standing and walking with physical therapy. Family does relate that Urology has seen him about 3 weeks ago and given him clearance from a prostate standpoint.   Objective   Interim History: Chart reviewed-orthopedic notes reviewed, physical therapy input verbalized to me  Objective: Filed Vitals:   11/08/11 1715 11/08/11 2024 11/09/11 0207 11/09/11 0500  BP: 128/72 130/76  173/91  Pulse: 82 72 74 76  Temp: 98.6 F (37 C) 98.2 F (36.8 C)  98.7 F (37.1 C)  TempSrc: Oral   Oral  Resp: 18 16 16 16   SpO2: 99% 96% 96% 98%    Intake/Output Summary (Last 24 hours) at 11/09/11 1419 Last data filed at 11/09/11 8119  Gross per 24 hour  Intake 1116.67 ml  Output    475 ml  Net 641.67 ml    Exam:  HEENT alert oriented  CHEST clinically clear  CARDIAC S1-S2 no murmur rub or gallop  ABDOMEN soft nontender nondistended  NEURO moving all 4 limbs no specific  weakness in lower extremities. Unable to control bladder at this afternoon. Strength 5 out of 5 both lower extremities and hips and in the hip flexors. Knee jerk more brisk in the right and no reflex noted in the left plantar flexion is within normal limits sensation seems grossly intact lower extremities. Symmetric smile. SKIN/MUSCULAR pain in lower back-patient on bedpan, therefore not examined   Data Reviewed: Basic Metabolic Panel:  Lab 11/09/11 1478 11/08/11 0405  NA 137 132*  K 3.8 4.2  CL 105 97  CO2 23 22  GLUCOSE 146* 410*  BUN 31* 40*  CREATININE 1.52* 1.56*  CALCIUM 8.9 10.3  MG -- --  PHOS -- --   Liver Function Tests: No results found for this basename: AST:5,ALT:5,ALKPHOS:5,BILITOT:5,PROT:5,ALBUMIN:5 in the last 168 hours No results found for this basename: LIPASE:5,AMYLASE:5 in the last 168 hours No results found for this basename: AMMONIA:5 in the last 168 hours CBC:  Lab 11/09/11 0435 11/08/11 0405  WBC 13.9* 27.1*  NEUTROABS -- 24.3*  HGB 11.0* 12.3*  HCT 34.2* 37.3*  MCV 88.1 86.9  PLT 273 351   Cardiac Enzymes: No results found for this basename: CKTOTAL:5,CKMB:5,CKMBINDEX:5,TROPONINI:5 in the last 168 hours BNP: No components found with this basename: POCBNP:5 CBG:  Lab 11/09/11 1331 11/09/11 0711 11/08/11 2256 11/08/11 1809 11/08/11 1625  GLUCAP 189* 119* 184* 145* 132*    No results found for this or any previous visit (from the past 240 hour(s)).   Studies: Dg Chest 2 View  11/08/2011  *RADIOLOGY REPORT*  Clinical Data: Status post fall; upper back pain, radiating to both legs.  CHEST - 2 VIEW  Comparison: Chest radiograph performed 07/06/1999 wall  Findings: The lungs are well-aerated.  Mild left basilar scarring is noted.  There is no evidence of focal opacification, pleural effusion or pneumothorax.  The heart is normal in size; calcification is noted within the aortic arch.  No acute osseous abnormalities are seen.  IMPRESSION: No acute  cardiopulmonary process seen.  Original Report Authenticated By: Tonia Ghent, M.D.   Dg Cervical Spine Complete  11/08/2011  *RADIOLOGY REPORT*  Clinical Data: Status post fall; neck pain.  CERVICAL SPINE - COMPLETE 4+ VIEW  Comparison: None.  Findings: There is no evidence of fracture or subluxation. Vertebral bodies demonstrate normal height and alignment.  There is narrowing of the intervertebral disc spaces along the lower cervical spine, with associated anterior and posterior disc osteophyte complexes.  Prevertebral soft tissues are within normal limits.  The provided odontoid view demonstrates no significant abnormality.  The visualized lung apices are clear.  IMPRESSION:  1.  No evidence of fracture or subluxation along the cervical spine. 2.  Mild degenerative change noted along the lower cervical spine.  Original Report Authenticated By: Tonia Ghent, M.D.   Dg Thoracic Spine 2 View  11/08/2011  *RADIOLOGY REPORT*  Clinical Data: Status post fall, with upper back pain, radiating to both legs.  THORACIC SPINE - 2 VIEW  Comparison: MRI of the thoracic spine performed 04/06/2009, and chest radiograph performed earlier today at 04:22 a.m.  Findings: There is no evidence of fracture or subluxation. Vertebral bodies demonstrate normal height and alignment. Intervertebral disc spaces are preserved, aside from degenerative change along the lower cervical spine.  The visualized portions of both lungs are clear.  The mediastinum is unremarkable in appearance.  IMPRESSION:  1.  No evidence of fracture or subluxation along the thoracic spine. 2.  Mild degenerative change noted along the lower cervical spine.  Original Report Authenticated By: Tonia Ghent, M.D.   Dg Lumbar Spine Complete  11/08/2011  *RADIOLOGY REPORT*  Clinical Data: Status post fall; upper back pain radiating down the back, extending to both legs.  LUMBAR SPINE - COMPLETE 4+ VIEW  Comparison: Lumbar spine radiographs performed 03/20/2011   Findings: There appears to be an acute fracture involving the anterior aspect of vertebral body L1, with disruption of the inferior endplate and a fracture extending to the superior endplate; there is minimal loss of vertebral body height.  No definite retropulsion is seen.  No additional fractures are identified.  There is mild loss of the intervertebral disc spaces along the mid lumbar spine, with associated mild degenerative change.  Mild lateral osteophytes are noted along the lumbar spine.  Mild degenerative change is noted at the lower lumbar spine.  The visualized bowel gas pattern is unremarkable in appearance; air and stool are noted within the colon.  The sacroiliac joints are within normal limits.  IMPRESSION: Acute fracture involving the anterior aspect of vertebral body L1, with involvement of both endplates and minimal loss of vertebral body height.  No definite evidence for retropulsion.  Original Report Authenticated By: Tonia Ghent, M.D.   Ct Head Wo Contrast  11/08/2011  *RADIOLOGY REPORT*  Clinical Data: Status post fall; concern for head injury.  CT HEAD WITHOUT CONTRAST  Technique:  Contiguous axial images were obtained from the base of the skull through the vertex without contrast.  Comparison: CT of the head performed 07/06/2011  Findings: There is no  evidence of acute infarction, mass lesion, or intra- or extra-axial hemorrhage on CT.  Prominence of the ventricles and sulci reflects moderate cortical volume loss.  Cerebellar atrophy is noted.  Scattered periventricular and subcortical white matter change likely reflects small vessel ischemic microangiopathy.  The brainstem and fourth ventricle are within normal limits.  The basal ganglia are unremarkable in appearance.  The cerebral hemispheres demonstrate grossly normal gray-white differentiation. No mass effect or midline shift is seen.  There is no evidence of fracture; degenerative change is noted about the dens.  The visualized  portions of the orbits are within normal limits.  The paranasal sinuses and mastoid air cells are well-aerated.  No significant soft tissue abnormalities are seen. Cerumen is noted within both external auditory canals.  IMPRESSION:  1.  No evidence of traumatic intracranial injury or fracture. 2.  Moderate cortical volume loss and scattered small vessel ischemic microangiopathy. 3.  Cerumen noted within both external auditory canals.  Original Report Authenticated By: Tonia Ghent, M.D.   Ct Lumbar Spine Wo Contrast  11/08/2011  *RADIOLOGY REPORT*  Clinical Data: Status post fall; lower back pain.  L1 fracture noted on radiograph; request further evaluation.  CT LUMBAR SPINE WITHOUT CONTRAST  Technique:  Multidetector CT imaging of the lumbar spine was performed without intravenous contrast administration. Multiplanar CT image reconstructions were also generated.  Comparison: Lumbar spine radiographs performed earlier today at 04:33 a.m.  Findings: There is an acute oblique fracture extending across the body of L1, with approximately 60% loss of height.  The fracture extends through both the superior and inferior endplates, but does not appear to involve the posterior aspect of the vertebral body. There is no evidence of retropulsion.  Multiple mildly displaced anterior fragments are noted, more prominent on the right side.  No significant surrounding hematoma is identified.  The surrounding intervertebral disc spaces appear intact.  There is chronic endplate degenerative change at L2-L3, with associated sclerosis and apparent large Schmorl's nodes.  Circumferential disc bulges are noted at L3-L4, L4-L5 and L5-S1, with associated osteophytes. No additional fractures are seen.  Mild facet disease is noted along the lumbar spine.  Nonspecific perinephric stranding is noted bilaterally.  Scattered calcification is noted along the abdominal aorta and its branches. Scattered diverticulosis is noted along the  visualized portions of the colon.  IMPRESSION:  1.  Acute oblique fracture extending across vertebral body L1, with 60% loss of height.  This involves both the superior and inferior endplates, but does not appear to involve the posterior aspect of the vertebral body.  No evidence of retropulsion.  Mildly displaced anterior fragments are more prominent on the right. 2.  Mild degenerative change noted along the lumbar spine. 3.  Scattered calcification along the abdominal aorta and its branches. 4.  Scattered diverticulosis along the visualized portions of the colon.  Original Report Authenticated By: Tonia Ghent, M.D.    Scheduled Meds:   . docusate sodium  100 mg Oral BID  . enoxaparin  40 mg Subcutaneous Q24H  . furosemide  40 mg Oral Daily  . glimepiride  2 mg Oral QAC breakfast  . insulin aspart  0-5 Units Subcutaneous QHS  . insulin aspart  0-9 Units Subcutaneous TID WC  . insulin glargine  15 Units Subcutaneous QHS  . loratadine  10 mg Oral Daily  . metoprolol  50 mg Oral BID  . pantoprazole  20 mg Oral Q1200  . polysaccharide iron  150 mg Oral Daily  . rivastigmine  4.6 mg Transdermal Daily  . simvastatin  20 mg Oral QHS  . DISCONTD: insulin aspart  0-9 Units Subcutaneous Q4H  . DISCONTD: insulin aspart  0-9 Units Subcutaneous TID WC   Continuous Infusions:   . sodium chloride 100 mL/hr at 11/08/11 2002  . DISCONTD: dextrose 5 % and 0.9% NaCl 100 mL/hr at 11/08/11 1610     Assessment/Plan:  Patient Active Problem List   Diagnoses   .  Fall at home+Fracture lumbar vertebra-Dr. Luiz Blare of orthopedics consulted-cleared for discharge.Avoid NSAIDs given history of diverticular bleed. Will  Dis-Continue current Dilaudid 1 mg. increase Percocet 5/325 2 tab every 4 when necessary for moderate pain   .  leukocytosis-white count is 27.1 today. This may likely be secondary to shift secondary to fall. He does not have fever or her white count at present time.white count dropped to 13.9,  could be likely secondary to chronic prednisone   .  GOUT-takes chronic prednisone for this and allopurinol 300 mg daily. His prednisone may have weakened his bones. We will get a vitamin D level and reassess and he may need supplementation   .  ANXIETY- stable   .  HYPERTENSION-takes Toprol 50 mg 2 times a day. Also Lasix 40 mg. Continue her Toprol 50 twice a day   .  GERD-continue Prevacid   .  hyperlipidemia-continue Zocor 20 mg at bedtime   .  ABNORMAL ELECTROCARDIOGRAM   .  PROSTATE CANCER, HX OF h some concern for possible pathological fracture. Dr. Luiz Blare has reviewed the films and will follow as it was noted there maybe Schmorl's nodule on some of the x-rays. We'll hold a PSA at this time. Recently been seen by his urologist Dr. Retta Diones. Low his CT scan was negative, he does have decreased reflex in the left side and incontinence of urine. It still in the differential but I suspect he may have cauda equina syndrome and as such I will get a stat MRI of the lumbar spine to be sure.  Marland Kitchen  DIVERTICULAR BLEEDING, HX OF-history of diverticular bleed 2004. Hemoglobin today is 12.3. Continue Prevacid 50 mg daily   .  DIABETES MELLITUS, TYPE II-takes Amaryl 2 mg and Lantus 15 units at bedtime. We will continue his Lantus. He will continue sliding scale insulin that has been prescribed. His blood sugar ranges between 189 and 119.   .       Code Status: Full  Family Communication: updated both daughters at bedside Disposition Plan: Home in am if MRI back negative   Pleas Koch, MD  [facility] Pager [phone] 11/09/2011, 2:19 PM    LOS: 1 day

## 2011-11-09 NOTE — Progress Notes (Signed)
Subjective: Pt with continued back pain. Awake and sitting up eating   Objective: Vital signs in last 24 hours: Temp:  [98 F (36.7 C)-98.7 F (37.1 C)] 98.7 F (37.1 C) (02/22 0500) Pulse Rate:  [63-86] 76  (02/22 0500) Resp:  [11-20] 16  (02/22 0500) BP: (128-173)/(60-91) 173/91 mmHg (02/22 0500) SpO2:  [96 %-100 %] 98 % (02/22 0500)  Intake/Output from previous day: 02/21 0701 - 02/22 0700 In: 1116.7 [P.O.:120; I.V.:996.7] Out: 875 [Urine:875] Intake/Output this shift:     Basename 11/09/11 0435 11/08/11 0405  HGB 11.0* 12.3*    Basename 11/09/11 0435 11/08/11 0405  WBC 13.9* 27.1*  RBC 3.88* 4.29  HCT 34.2* 37.3*  PLT 273 351    Basename 11/09/11 0435 11/08/11 0405  NA 137 132*  K 3.8 4.2  CL 105 97  CO2 23 22  BUN 31* 40*  CREATININE 1.52* 1.56*  GLUCOSE 146* 410*  CALCIUM 8.9 10.3   No results found for this basename: LABPT:2,INR:2 in the last 72 hours  Neurologically intact ABD soft Neurovascular intact Sensation intact distally Intact pulses distally Dorsiflexion/Plantar flexion intact  Assessment/Plan: 78 yo male with L-1 comp fx/ attempt up today with PT if fails oob then may try brace.   Tenishia Ekman L 11/09/2011, 7:46 AM

## 2011-11-10 LAB — GLUCOSE, CAPILLARY: Glucose-Capillary: 142 mg/dL — ABNORMAL HIGH (ref 70–99)

## 2011-11-10 MED ORDER — DSS 100 MG PO CAPS: 100.0000 mg | ORAL_CAPSULE | Freq: Two times a day (BID) | ORAL | Status: DC

## 2011-11-10 MED ORDER — OXYCODONE-ACETAMINOPHEN 5-325 MG PO TABS: 1.0000 | ORAL_TABLET | ORAL | Status: DC | PRN

## 2011-11-10 NOTE — Progress Notes (Signed)
Physical Therapy Treatment Patient Details Name: [name] MRN: [id number] DOB: 1926-05-23 Today's Date: 11/10/2011  PT Assessment/Plan  PT - Assessment/Plan Comments on Treatment Session: Pt doing much better during today's session.  Tolerated increased ambulation with no increase in pain.  Pt states pain decreased with ambulation.  Pt to D/C today home.   PT Plan: Discharge plan remains appropriate PT Frequency: Min 3X/week Follow Up Recommendations: Home health PT;Supervision/Assistance - 24 hour Equipment Recommended: None recommended by OT PT Goals  Acute Rehab PT Goals PT Goal Formulation: With patient Time For Goal Achievement: 2 weeks Pt will go Sit to Stand: with supervision PT Goal: Sit to Stand - Progress: Progressing toward goal Pt will go Stand to Sit: with supervision PT Goal: Stand to Sit - Progress: Progressing toward goal Pt will Ambulate: 51 - 150 feet;with supervision;with least restrictive assistive device PT Goal: Ambulate - Progress: Progressing toward goal  PT Treatment Precautions/Restrictions  Precautions Precautions: Fall;Back;Other (comment) Restrictions Weight Bearing Restrictions: No Mobility (including Balance) Bed Mobility Bed Mobility: Yes Rolling Left: 4: Min assist Sit to Sidelying Left: 4: Min assist Transfers Transfers: Yes Sit to Stand: 4: Min assist;With upper extremity assist;With armrests;From chair/3-in-1 Sit to Stand Details (indicate cue type and reason): Cues for hand placement and to maintain back precautions.  Stand to Sit: 4: Min assist;With upper extremity assist;With armrests;To chair/3-in-1 Stand to Sit Details: Min/guard for safety with cues for hand placement to maintain upright posture.  Ambulation/Gait Ambulation/Gait: Yes Ambulation/Gait Assistance: 4: Min assist Ambulation/Gait Assistance Details (indicate cue type and reason): Min/guard for safety with cues for increased step width due to pt tendency to step with  narrow BOS.  cues for upright posture.  Ambulation Distance (Feet): 150 Feet Assistive device: Rolling walker Gait Pattern: Step-through pattern;Scissoring Gait velocity: decreased    Exercise    End of Session PT - End of Session Equipment Utilized During Treatment: Gait belt Activity Tolerance: Patient tolerated treatment well Patient left: in chair;with call bell in reach Nurse Communication: Mobility status for transfers;Mobility status for ambulation General Behavior During Session: [facility] for tasks performed Cognition: [facility] for tasks performed  [name], [name] 11/10/2011, 11:51 AM

## 2011-11-10 NOTE — Progress Notes (Signed)
Cm spoke with pt's daughter [name] concerning d/c planning. Pt's daughter confirmed pt to d/c to her residence where other adult daughter will help assist in care. Pt's daughter confirmed [facility] to provide [facility] services + DME. [facility] notified of patient's discharge home. CSW consulted concerning non-emergent tx home.  Leonie Green [phone]

## 2011-11-10 NOTE — Progress Notes (Signed)
Pt discharged to home via ambulance. Assessment unchanged from am.

## 2011-11-10 NOTE — Discharge Summary (Signed)
[facility] Discharge Summary  Recommendations on followup -Followup with Dr. Adonis Housekeeper need medications for urinary incontinence as experienced an episode of this. -Will need PT and vitamin D. level/OT/home health to assist and an aide at home, given rolling walker -Consider discontinuation of prednisone, consider DEXA scan as he had a fracture secondary to fall and is on steroid. Discussed calcium supplementation and vitamin D. level  Date of Admission: 11/08/2011  2:32 AM Admitter: @ADMITPROV @   Date of Discharge2/23/2013 Attending Physician: Pleas Koch, MD  Royden Purl NWG:[id number] DOB: [birth year] DOA: 11/08/2011 PCP: Willow Ora, MD, MD  Brief narrative: 78 year old male admitted to 11/08/11 with a mechanical fall on going to bathroom.  Suffered no syncopal episode prior to this, no chest pain, no altered mental status. Was a mechanical fall.  Found to have a compression fracture in the lower back which seemed stable. Evaluated by orthopedics and cleared for discharge. Had an episode of incontinence  urine prompting a workup for Cauda Equina syndrome which was negative.  Past medical history: History of low back pain recently placed on steroids, dementia, prostate cancer status post prostatectomy-recently seen by Dr. Retta Diones about 3 weeks ago  Consultants:  Orthopedics, Dr. Luiz Blare  Procedures:  Lumbar x-ray 2/21 = acute fracture involving anterior aspect vertebral body L1 with both endplate involvement loss of vertebral height     Chest x-ray two-view 2/21 = no acute cardiac process   CT scan 10/2111 = acute fracture extending across vertebral body L1 with 6% loss of height this does involve bolus.. His discomfort in both posterior aspect of her body no evidence of retropulsion, mild showed changes lower lumbar spine, scattered consultation lung abdominal aorta, scattered diverticulosis  MRI 2/22 = benign appearing compression fracture of L1. No  retropulsion-multilevel disc disease and facet disease contributing to mild levels multifactorial spinal, lateral recess and foraminal stenosis  Antibiotics:None    Hospital Course by problem list:  Assessment/Plan:    Patient Active Problem List   Diagnoses    .   Fall at home+Fracture lumbar vertebra-Dr. Luiz Blare of orthopedics consulted-cleared for discharge.Avoid NSAIDs given history of diverticular bleed. Will  need Percocet 5/325 2 tab every 4-6 when necessary for moderate pain.  Would recommend carefully following for somnolence. Multiple tests were done and an MRI was done which did not show any significant cauda equina. This was then because patient had significant incontinence of urine.    Marland Kitchen   leukocytosis-white count is 27.1 on Atrovent. WBC trended down on its own 13.9. In likely secondary to chronic prednisone.   Marland Kitchen   GOUT-takes chronic prednisone for this and allopurinol 300 mg daily. His prednisone may have weakened his bones. We need vitamin D level and reassess and he may need supplementation.  Recommend calcium use in a the patient on chronic steroids. Decision to be made by primary care physician with regards to the   .   ANXIETY- stable    .   HYPERTENSION-takes Toprol 50 mg 2 times a day. Also Lasix 40 mg. Continue her Toprol 50 twice a day    .   GERD-continue Prevacid    .   hyperlipidemia-continue Zocor 20 mg at bedtime    .    urinary incontinence-see above-would hold on any other workup as he has not had a recurrence of this issue. Likely this was because of pain in the lower back.  Marland Kitchen   PROSTATE CANCER, HX OF h some concern for possible pathological fracture. Dr. Luiz Blare  has reviewed the films and will follow as it was noted there maybe Schmorl's nodule on some of the x-rays. We'll hold a PSA at this time.  Recently been seen by his urologist Dr. Retta Diones. MRI of lower back was reassuring for no carb adequate    .   DIVERTICULAR BLEEDING, HX OF-history of diverticular  bleed 2004. Hemoglobin today is 12.3. Continue Prevacid 50 mg daily    .   DIABETES MELLITUS, TYPE II-takes Amaryl 2 mg and Lantus 15 units at bedtime. We will continue his Lantus. He will continue sliding scale insulin that has been prescribed. His blood sugar ranges were well controlled in hospital     Procedures Performed and pertinent labs: Dg Chest 2 View  11/08/2011  *RADIOLOGY REPORT*  Clinical Data: Status post fall; upper back pain, radiating to both legs.  CHEST - 2 VIEW  Comparison: Chest radiograph performed 07/06/1999 wall  Findings: The lungs are well-aerated.  Mild left basilar scarring is noted.  There is no evidence of focal opacification, pleural effusion or pneumothorax.  The heart is normal in size; calcification is noted within the aortic arch.  No acute osseous abnormalities are seen.  IMPRESSION: No acute cardiopulmonary process seen.  Original Report Authenticated By: Tonia Ghent, M.D.   Dg Cervical Spine Complete  11/08/2011  *RADIOLOGY REPORT*  Clinical Data: Status post fall; neck pain.  CERVICAL SPINE - COMPLETE 4+ VIEW  Comparison: None.  Findings: There is no evidence of fracture or subluxation. Vertebral bodies demonstrate normal height and alignment.  There is narrowing of the intervertebral disc spaces along the lower cervical spine, with associated anterior and posterior disc osteophyte complexes.  Prevertebral soft tissues are within normal limits.  The provided odontoid view demonstrates no significant abnormality.  The visualized lung apices are clear.  IMPRESSION:  1.  No evidence of fracture or subluxation along the cervical spine. 2.  Mild degenerative change noted along the lower cervical spine.  Original Report Authenticated By: Tonia Ghent, M.D.   Dg Thoracic Spine 2 View  11/08/2011  *RADIOLOGY REPORT*  Clinical Data: Status post fall, with upper back pain, radiating to both legs.  THORACIC SPINE - 2 VIEW  Comparison: MRI of the thoracic spine performed  04/06/2009, and chest radiograph performed earlier today at 04:22 a.m.  Findings: There is no evidence of fracture or subluxation. Vertebral bodies demonstrate normal height and alignment. Intervertebral disc spaces are preserved, aside from degenerative change along the lower cervical spine.  The visualized portions of both lungs are clear.  The mediastinum is unremarkable in appearance.  IMPRESSION:  1.  No evidence of fracture or subluxation along the thoracic spine. 2.  Mild degenerative change noted along the lower cervical spine.  Original Report Authenticated By: Tonia Ghent, M.D.   Dg Lumbar Spine Complete  11/08/2011  *RADIOLOGY REPORT*  Clinical Data: Status post fall; upper back pain radiating down the back, extending to both legs.  LUMBAR SPINE - COMPLETE 4+ VIEW  Comparison: Lumbar spine radiographs performed 03/20/2011  Findings: There appears to be an acute fracture involving the anterior aspect of vertebral body L1, with disruption of the inferior endplate and a fracture extending to the superior endplate; there is minimal loss of vertebral body height.  No definite retropulsion is seen.  No additional fractures are identified.  There is mild loss of the intervertebral disc spaces along the mid lumbar spine, with associated mild degenerative change.  Mild lateral osteophytes are noted along the lumbar spine.  Mild  degenerative change is noted at the lower lumbar spine.  The visualized bowel gas pattern is unremarkable in appearance; air and stool are noted within the colon.  The sacroiliac joints are within normal limits.  IMPRESSION: Acute fracture involving the anterior aspect of vertebral body L1, with involvement of both endplates and minimal loss of vertebral body height.  No definite evidence for retropulsion.  Original Report Authenticated By: Tonia Ghent, M.D.   Ct Head Wo Contrast  11/08/2011  *RADIOLOGY REPORT*  Clinical Data: Status post fall; concern for head injury.  CT HEAD  WITHOUT CONTRAST  Technique:  Contiguous axial images were obtained from the base of the skull through the vertex without contrast.  Comparison: CT of the head performed 07/06/2011  Findings: There is no evidence of acute infarction, mass lesion, or intra- or extra-axial hemorrhage on CT.  Prominence of the ventricles and sulci reflects moderate cortical volume loss.  Cerebellar atrophy is noted.  Scattered periventricular and subcortical white matter change likely reflects small vessel ischemic microangiopathy.  The brainstem and fourth ventricle are within normal limits.  The basal ganglia are unremarkable in appearance.  The cerebral hemispheres demonstrate grossly normal gray-white differentiation. No mass effect or midline shift is seen.  There is no evidence of fracture; degenerative change is noted about the dens.  The visualized portions of the orbits are within normal limits.  The paranasal sinuses and mastoid air cells are well-aerated.  No significant soft tissue abnormalities are seen. Cerumen is noted within both external auditory canals.  IMPRESSION:  1.  No evidence of traumatic intracranial injury or fracture. 2.  Moderate cortical volume loss and scattered small vessel ischemic microangiopathy. 3.  Cerumen noted within both external auditory canals.  Original Report Authenticated By: Tonia Ghent, M.D.   Ct Lumbar Spine Wo Contrast  11/08/2011  *RADIOLOGY REPORT*  Clinical Data: Status post fall; lower back pain.  L1 fracture noted on radiograph; request further evaluation.  CT LUMBAR SPINE WITHOUT CONTRAST  Technique:  Multidetector CT imaging of the lumbar spine was performed without intravenous contrast administration. Multiplanar CT image reconstructions were also generated.  Comparison: Lumbar spine radiographs performed earlier today at 04:33 a.m.  Findings: There is an acute oblique fracture extending across the body of L1, with approximately 60% loss of height.  The fracture extends  through both the superior and inferior endplates, but does not appear to involve the posterior aspect of the vertebral body. There is no evidence of retropulsion.  Multiple mildly displaced anterior fragments are noted, more prominent on the right side.  No significant surrounding hematoma is identified.  The surrounding intervertebral disc spaces appear intact.  There is chronic endplate degenerative change at L2-L3, with associated sclerosis and apparent large Schmorl's nodes.  Circumferential disc bulges are noted at L3-L4, L4-L5 and L5-S1, with associated osteophytes. No additional fractures are seen.  Mild facet disease is noted along the lumbar spine.  Nonspecific perinephric stranding is noted bilaterally.  Scattered calcification is noted along the abdominal aorta and its branches. Scattered diverticulosis is noted along the visualized portions of the colon.  IMPRESSION:  1.  Acute oblique fracture extending across vertebral body L1, with 60% loss of height.  This involves both the superior and inferior endplates, but does not appear to involve the posterior aspect of the vertebral body.  No evidence of retropulsion.  Mildly displaced anterior fragments are more prominent on the right. 2.  Mild degenerative change noted along the lumbar spine. 3.  Scattered calcification  along the abdominal aorta and its branches. 4.  Scattered diverticulosis along the visualized portions of the colon.  Original Report Authenticated By: Tonia Ghent, M.D.   Mr Lumbar Spine W Wo Contrast  11/09/2011  *RADIOLOGY REPORT*  Clinical Data: L1 compression fracture.  MRI LUMBAR SPINE WITHOUT AND WITH CONTRAST  Technique:  Multiplanar and multiecho pulse sequences of the lumbar spine were obtained without and with intravenous contrast.  Contrast: 16mL MULTIHANCE GADOBENATE DIMEGLUMINE 529 MG/ML IV SOLN  Comparison: CT scan 11/08/2011.  Findings: The sagittal MR images demonstrate normal overall alignment of the lumbar vertebral  bodies.  Moderate degenerative disc disease and facet disease at all levels.  There is an acute biconcave type L1 compression fracture.  There is some preserved T1 marrow signal suggesting a benign osteoporotic type fracture.  No involvement of the pedicles.  No retropulsion.  The conus medullaris terminates at the top of L1.  No pars defects. The retroperitoneum and paraspinal regions are unremarkable.  A right renal cyst is noted.  L1-2:  No significant findings.  Moderate facet disease.  L2-3:  Diffuse bulging degenerated annulus, short pedicles, facet disease and ligamentum flavum thickening contribute to mild spinal stenosis and moderate, left greater than right lateral recess stenosis.  There is also mild foraminal stenosis bilaterally, left greater than right.  L3-4:  Diffuse bulging degenerated annulus, osteophytic ridging, short pedicles and facet disease contribute to mild / early spinal stenosis and mild bilateral lateral recess and foraminal stenosis, right greater than left.  L4-5:  Diffuse bulging annulus, osteophytic ridging, short pedicles and facet disease contribute to mild spinal and bilateral lateral recess stenosis and bilateral foraminal stenosis.  A small annular rent and tiny central disc protrusion are also noted.  L5-S1:  Diffuse bulging annulus, short pedicles, severe facet disease and ligamentum flavum thickening contribute to mild spinal and moderate bilateral lateral recess and moderate bilateral foraminal stenosis, right greater than left.  IMPRESSION:  1.  Benign appearing compression fracture of L1.  No retropulsion. 2.  Multilevel disc disease and facet disease contributing to multilevel multifactorial spinal, lateral recess and foraminal stenosis.  Original Report Authenticated By: P. Loralie Champagne, M.D.   Discharge Vitals & PE:  BP 167/79  Pulse 76  Temp(Src) 98.9 F (37.2 C) (Oral)  Resp 16  SpO2 99% HEENT alert oriented  CHEST clinically clear  CARDIAC S1-S2 no murmur  rub or gallop  ABDOMEN soft nontender nondistended  NEURO moving all 4 limbs no specific weakness in lower extremities. Unable to control bladder at this afternoon. Strength 5 out of 5 both lower extremities and hips and in the hip flexors. Knee jerk more brisk in the right and no reflex noted in the left plantar flexion is within normal limits sensation seems grossly intact lower extremities. Symmetric smile.   Discharge Labs:  Results for orders placed during the hospital encounter of 11/08/11 (from the past 24 hour(s))  GLUCOSE, CAPILLARY     Status: Abnormal   Collection Time   11/09/11  1:31 PM      Component Value Range   Glucose-Capillary 189 (*) 70 - 99 (mg/dL)  GLUCOSE, CAPILLARY     Status: Abnormal   Collection Time   11/09/11  4:53 PM      Component Value Range   Glucose-Capillary 231 (*) 70 - 99 (mg/dL)  GLUCOSE, CAPILLARY     Status: Abnormal   Collection Time   11/09/11 10:00 PM      Component Value Range  Glucose-Capillary 217 (*) 70 - 99 (mg/dL)  GLUCOSE, CAPILLARY     Status: Abnormal   Collection Time   11/10/11  7:33 AM      Component Value Range   Glucose-Capillary 142 (*) 70 - 99 (mg/dL)    Disposition and follow-up:   JeffAvante J Perkins was discharged from in good condition.    Follow-up Appointments: Discharge Orders    Future Orders Please Complete By Expires   Diet - low sodium heart healthy      Increase activity slowly      Call MD for:  severe uncontrolled pain      Call MD for:  redness, tenderness, or signs of infection (pain, swelling, redness, odor or green/yellow discharge around incision site)      Call MD for:  hives      Call MD for:  extreme fatigue         Discharge Medications: Medication List  As of 11/10/2011 10:47 AM   STOP taking these medications         HYDROcodone-acetaminophen 5-500 MG per tablet         TAKE these medications         allopurinol 300 MG tablet   Commonly known as: ZYLOPRIM   Take 300 mg by mouth daily.       cetirizine 10 MG tablet   Commonly known as: ZYRTEC   Take 10 mg by mouth every evening.      DSS 100 MG Caps   Take 100 mg by mouth 2 (two) times daily.      furosemide 40 MG tablet   Commonly known as: LASIX   Take 40 mg by mouth daily.      glimepiride 2 MG tablet   Commonly known as: AMARYL   Take 2 mg by mouth daily before breakfast.      insulin glargine 100 UNIT/ML injection   Commonly known as: LANTUS   Inject 15 Units into the skin at bedtime.      lansoprazole 15 MG capsule   Commonly known as: PREVACID   Take 15 mg by mouth daily.      LORazepam 0.5 MG tablet   Commonly known as: ATIVAN   Take 0.5 mg by mouth every 6 (six) hours as needed. Anxiety      metoprolol 50 MG tablet   Commonly known as: LOPRESSOR   Take 50 mg by mouth 2 (two) times daily.      oxyCODONE-acetaminophen 5-325 MG per tablet   Commonly known as: PERCOCET   Take 1-2 tablets by mouth every 4 (four) hours as needed.      polysaccharide iron 150 MG Caps capsule   Commonly known as: NIFEREX   Take 150 mg by mouth daily.      predniSONE 10 MG tablet   Commonly known as: DELTASONE   Take 30 mg by mouth 2 (two) times daily. Prednisone taper      rivastigmine 4.6 mg/24hr   Commonly known as: EXELON   Place 1 patch onto the skin daily.      simvastatin 20 MG tablet   Commonly known as: ZOCOR   Take 20 mg by mouth at bedtime.           Medications Discontinued During This Encounter  Medication Reason  . haloperidol lactate (HALDOL) 5 MG/ML injection Returned to ADS  . insulin aspart (novoLOG) injection 0-9 Units   . dextrose 5 %-0.9 % sodium chloride infusion   .  insulin aspart (novoLOG) injection 0-9 Units   . 0.9 %  sodium chloride infusion   . HYDROmorphone (DILAUDID) injection 1 mg   . oxyCODONE-acetaminophen (PERCOCET) 5-325 MG per tablet 1 tablet   . furosemide (LASIX) tablet 40 mg   . HYDROcodone-acetaminophen (VICODIN) 5-500 MG per tablet Stop Taking at Discharge     Signed: [facility] 11/10/2011, 10:47 AM

## 2011-11-10 NOTE — Progress Notes (Signed)
CSW received call regarding pt need for non emergency ambulance transportation to patient residence. CSW assessed pt and confirmed plans with pt daughter. Pt daughter will meet pt at pt home. CSW completed ambulance form.  Rn agreed to call ambulance when patient is ready after lunch and insulin is administered. .No further Clinical Social Work needs, signing off.   Catha Gosselin, LCSWA  [phone] weekend coverage .11/10/2011 11:43am

## 2011-11-10 NOTE — Progress Notes (Signed)
Occupational Therapy Evaluation Patient Details Name: [name] MRN: [id number] DOB: [birth year] Today's Date: 11/10/2011  Problem List:  Patient Active Problem List  Diagnoses  . DIABETES MELLITUS, TYPE II  . HYPERLIPIDEMIA  . GOUT  . ANXIETY  . HYPERTENSION  . GERD  . OSTEOARTHRITIS  . ABNORMAL ELECTROCARDIOGRAM  . PROSTATE CANCER, HX OF  . Divertic bleed 2004-sees [facility]  . Fracture lumbar vertebra-close, non-surgical  . Fall at home    Past Medical History:  Past Medical History  Diagnosis Date  . Prostate cancer   . Dementia   . Diabetes mellitus   . Hypertension   . Gout    Past Surgical History:  Past Surgical History  Procedure Date  . Left leg surgery   . Right shoulder surgery   . Left cataract extraction     OT Assessment/Plan/Recommendation OT Assessment Clinical Impression Statement: This 78 year old male was admitted s/p compression fx after a fall.  He has 24/7 at home.  He would benefit from skilled OT to increase activity tolerance and improve independence with ADLs wtih min A to min guard goals in acute OT Recommendation/Assessment: Patient will need skilled OT in the acute care venue OT Problem List: Decreased strength;Decreased activity tolerance;Decreased knowledge of use of DME or AE;Decreased knowledge of precautions;Pain;Impaired balance (sitting and/or standing) OT Therapy Diagnosis : Generalized weakness OT Plan OT Frequency: Min 2X/week OT Treatment/Interventions: Self-care/ADL training;Therapeutic activities;Patient/family education;Balance training;DME and/or AE instruction OT Recommendation Follow Up Recommendations: Home health OT;Supervision/Assistance - 24 hour Equipment Recommended: None recommended by OT Individuals Consulted Consulted and Agree with Results and Recommendations: Patient OT Goals Acute Rehab OT Goals OT Goal Formulation: With patient Time For Goal Achievement: 2 weeks ADL Goals Pt Will Perform Grooming:  with supervision;Standing at sink ADL Goal: Grooming - Progress: Goal set today Pt Will Perform Lower Body Bathing: with min assist;with adaptive equipment;Sit to stand from chair ADL Goal: Lower Body Bathing - Progress: Goal set today Pt Will Perform Lower Body Dressing: with min assist;Sit to stand from chair;with adaptive equipment ADL Goal: Lower Body Dressing - Progress: Goal set today Pt Will Transfer to Toilet: with min assist;3-in-1;Ambulation ADL Goal: Toilet Transfer - Progress: Goal set today Pt Will Perform Toileting - Clothing Manipulation: with min assist;Standing (min guard) ADL Goal: Toileting - Clothing Manipulation - Progress: Goal set today Pt Will Perform Toileting - Hygiene: with min assist;Standing at 3-in-1/toilet (min guard) ADL Goal: Toileting - Hygiene - Progress: Goal set today  OT Evaluation Precautions/Restrictions  Precautions Precautions: Fall;Back;Other (comment) (compression fx:  back precautions for decreased pain) Restrictions Weight Bearing Restrictions: No Prior Functioning Home Living Home Adaptive Equipment: Walker - rolling;Bedside commode/3-in-1;Shower chair with back Additional Comments: states someone is always home with him Prior Function Level of Independence: Independent with basic ADLs;Requires assistive device for independence;Independent with gait ADL ADL Grooming: Simulated;Set up Where Assessed - Grooming: Sitting, bed;Unsupported Upper Body Bathing: Performed;Set up Where Assessed - Upper Body Bathing: Sitting, bed;Unsupported Lower Body Bathing: Performed;Moderate assistance Where Assessed - Lower Body Bathing: Sit to stand from bed Upper Body Dressing: Performed;Set up Where Assessed - Upper Body Dressing: Unsupported;Sitting, bed Lower Body Dressing: Performed;Maximal assistance Where Assessed - Lower Body Dressing: Sit to stand from bed Toilet Transfer: Simulated;Minimal assistance Toilet Transfer Method: Stand pivot;Other  (comment) (bed to recliner) Toileting - Clothing Manipulation: Simulated;Minimal assistance Where Assessed - Toileting Clothing Manipulation: Sit to stand from 3-in-1 or toilet Toileting - Hygiene: Simulated;Minimal assistance Where Assessed - Toileting Hygiene: Sit  to stand from 3-in-1 or toilet Equipment Used: Reacher;Rolling walker;Sock aid ADL Comments: educated on and practiced with sock aid and reacher; pt will have assistance at home.  Discussed back precautions to decrease pain Vision/Perception  Vision - History Patient Visual Report: No change from baseline Cognition Cognition Orientation Level: Oriented to person;Oriented to place;Oriented to situation;Disoriented to time Sensation/Coordination   Extremity Assessment Bil UEs to 90 degrees; following back precautions for comfort   Mobility  Bed Mobility Bed Mobility: Yes Rolling Left: 4: Min assist Sit to Sidelying Left: 4: Min assist Transfers Sit to Stand: 4: Min assist;From elevated surface;With upper extremity assist;From bed Sit to Stand Details (indicate cue type and reason): min cues for hand placement Exercises   End of Session OT - End of Session Activity Tolerance: Patient limited by pain Patient left: in chair;with call bell in reach General Behavior During Session: [name] for tasks performed Cognition: [facility] for tasks performed [facility], OTR/L [phone] 11/10/2011  Akeiba Axelson 11/10/2011, 11:19 AM

## 2011-11-10 NOTE — Progress Notes (Signed)
Called daughter [name] (at pt request) to inform her of paientts discharge. She will be here at 1230pm to pick him up.

## 2011-11-12 LAB — GLUCOSE, CAPILLARY: Glucose-Capillary: 217 mg/dL — ABNORMAL HIGH (ref 70–99)

## 2011-11-15 ENCOUNTER — Inpatient Hospital Stay (HOSPITAL_COMMUNITY)
Admission: EM | Admit: 2011-11-15 | Discharge: 2011-11-19 | DRG: 689 | Disposition: A | Discharge status: Skilled Nursing Facility | Payer: Medicare Other | Attending: Internal Medicine | Admitting: Internal Medicine

## 2011-11-15 ENCOUNTER — Emergency Department (HOSPITAL_COMMUNITY): Payer: Medicare Other

## 2011-11-15 ENCOUNTER — Encounter (HOSPITAL_COMMUNITY): Payer: Self-pay

## 2011-11-15 DIAGNOSIS — R4182 Altered mental status, unspecified: Secondary | ICD-10-CM | POA: Diagnosis present

## 2011-11-15 DIAGNOSIS — I10 Essential (primary) hypertension: Secondary | ICD-10-CM | POA: Diagnosis present

## 2011-11-15 DIAGNOSIS — E785 Hyperlipidemia, unspecified: Secondary | ICD-10-CM | POA: Diagnosis present

## 2011-11-15 DIAGNOSIS — M109 Gout, unspecified: Secondary | ICD-10-CM | POA: Diagnosis present

## 2011-11-15 DIAGNOSIS — IMO0001 Reserved for inherently not codable concepts without codable children: Secondary | ICD-10-CM | POA: Diagnosis present

## 2011-11-15 DIAGNOSIS — R627 Adult failure to thrive: Secondary | ICD-10-CM | POA: Diagnosis present

## 2011-11-15 DIAGNOSIS — G9340 Encephalopathy, unspecified: Secondary | ICD-10-CM | POA: Diagnosis present

## 2011-11-15 DIAGNOSIS — N183 Chronic kidney disease, stage 3 unspecified: Secondary | ICD-10-CM | POA: Diagnosis present

## 2011-11-15 DIAGNOSIS — Z8546 Personal history of malignant neoplasm of prostate: Secondary | ICD-10-CM

## 2011-11-15 DIAGNOSIS — S32009A Unspecified fracture of unspecified lumbar vertebra, initial encounter for closed fracture: Secondary | ICD-10-CM | POA: Diagnosis present

## 2011-11-15 DIAGNOSIS — N390 Urinary tract infection, site not specified: Principal | ICD-10-CM

## 2011-11-15 DIAGNOSIS — E860 Dehydration: Secondary | ICD-10-CM | POA: Diagnosis present

## 2011-11-15 DIAGNOSIS — R6251 Failure to thrive (child): Secondary | ICD-10-CM

## 2011-11-15 DIAGNOSIS — N138 Other obstructive and reflux uropathy: Secondary | ICD-10-CM | POA: Diagnosis present

## 2011-11-15 DIAGNOSIS — I129 Hypertensive chronic kidney disease with stage 1 through stage 4 chronic kidney disease, or unspecified chronic kidney disease: Secondary | ICD-10-CM | POA: Diagnosis present

## 2011-11-15 DIAGNOSIS — F0390 Unspecified dementia without behavioral disturbance: Secondary | ICD-10-CM | POA: Diagnosis present

## 2011-11-15 DIAGNOSIS — N401 Enlarged prostate with lower urinary tract symptoms: Secondary | ICD-10-CM | POA: Diagnosis present

## 2011-11-15 DIAGNOSIS — Z9181 History of falling: Secondary | ICD-10-CM

## 2011-11-15 DIAGNOSIS — E119 Type 2 diabetes mellitus without complications: Secondary | ICD-10-CM | POA: Diagnosis present

## 2011-11-15 DIAGNOSIS — R3915 Urgency of urination: Secondary | ICD-10-CM | POA: Diagnosis present

## 2011-11-15 DIAGNOSIS — N179 Acute kidney failure, unspecified: Secondary | ICD-10-CM | POA: Diagnosis present

## 2011-11-15 HISTORY — DX: Unspecified osteoarthritis, unspecified site: M19.90

## 2011-11-15 HISTORY — DX: Hyperlipidemia, unspecified: E78.5

## 2011-11-15 HISTORY — DX: Chronic kidney disease, stage 2 (mild): N18.2

## 2011-11-15 HISTORY — DX: Atherosclerotic heart disease of native coronary artery without angina pectoris: I25.10

## 2011-11-15 HISTORY — DX: Reserved for inherently not codable concepts without codable children: IMO0001

## 2011-11-15 HISTORY — DX: Diverticulosis of intestine, part unspecified, without perforation or abscess without bleeding: K57.90

## 2011-11-15 HISTORY — DX: Unspecified fracture of unspecified lumbar vertebra, initial encounter for closed fracture: S32.009A

## 2011-11-15 HISTORY — DX: Encounter for other specified aftercare: Z51.89

## 2011-11-15 HISTORY — DX: Acute myocardial infarction, unspecified: I21.9

## 2011-11-15 HISTORY — DX: Gastrointestinal hemorrhage, unspecified: K92.2

## 2011-11-15 HISTORY — DX: Anemia, unspecified: D64.9

## 2011-11-15 LAB — URINALYSIS, ROUTINE W REFLEX MICROSCOPIC
Glucose, UA: NEGATIVE mg/dL
Leukocytes, UA: NEGATIVE
Protein, ur: 100 mg/dL — AB
Specific Gravity, Urine: 1.012 (ref 1.005–1.030)
pH: 5.5 (ref 5.0–8.0)

## 2011-11-15 LAB — COMPREHENSIVE METABOLIC PANEL
AST: 25 U/L (ref 0–37)
Albumin: 2.5 g/dL — ABNORMAL LOW (ref 3.5–5.2)
BUN: 24 mg/dL — ABNORMAL HIGH (ref 6–23)
Calcium: 9.7 mg/dL (ref 8.4–10.5)
Chloride: 103 mEq/L (ref 96–112)
Creatinine, Ser: 1.49 mg/dL — ABNORMAL HIGH (ref 0.50–1.35)
Total Bilirubin: 0.3 mg/dL (ref 0.3–1.2)
Total Protein: 6.3 g/dL (ref 6.0–8.3)

## 2011-11-15 LAB — DIFFERENTIAL
Basophils Absolute: 0 10*3/uL (ref 0.0–0.1)
Basophils Relative: 0 % (ref 0–1)
Eosinophils Absolute: 0.3 10*3/uL (ref 0.0–0.7)
Monocytes Absolute: 1.3 10*3/uL — ABNORMAL HIGH (ref 0.1–1.0)
Monocytes Relative: 10 % (ref 3–12)
Neutro Abs: 9.7 10*3/uL — ABNORMAL HIGH (ref 1.7–7.7)
Neutrophils Relative %: 74 % (ref 43–77)

## 2011-11-15 LAB — CREATININE, SERUM
Creatinine, Ser: 1.62 mg/dL — ABNORMAL HIGH (ref 0.50–1.35)
GFR calc Af Amer: 44 mL/min — ABNORMAL LOW (ref >90)

## 2011-11-15 LAB — CBC
HCT: 33.5 % — ABNORMAL LOW (ref 39.0–52.0)
MCH: 28.3 pg (ref 26.0–34.0)
MCHC: 31.6 g/dL (ref 30.0–36.0)
RDW: 13.6 % (ref 11.5–15.5)

## 2011-11-15 LAB — GLUCOSE, CAPILLARY: Glucose-Capillary: 333 mg/dL — ABNORMAL HIGH (ref 70–99)

## 2011-11-15 LAB — POCT I-STAT TROPONIN I: Troponin i, poc: 0.01 ng/mL (ref 0.00–0.08)

## 2011-11-15 MED ORDER — ALBUTEROL SULFATE (5 MG/ML) 0.5% IN NEBU: 2.5000 mg | INHALATION_SOLUTION | RESPIRATORY_TRACT | Status: DC | PRN

## 2011-11-15 MED ORDER — SULFAMETHOXAZOLE-TRIMETHOPRIM 800-160 MG PO TABS: 1.0000 | ORAL_TABLET | Freq: Two times a day (BID) | ORAL | Status: DC

## 2011-11-15 MED ORDER — RIVASTIGMINE 4.6 MG/24HR TD PT24
4.6000 mg | MEDICATED_PATCH | Freq: Every day | TRANSDERMAL | Status: DC
  Administered 2011-11-16 – 2011-11-19 (×4): 4.6 mg via TRANSDERMAL
  Filled 2011-11-15 (×5): qty 1

## 2011-11-15 MED ORDER — ACETAMINOPHEN 325 MG PO TABS
ORAL_TABLET | ORAL | Status: AC
  Filled 2011-11-15: qty 2

## 2011-11-15 MED ORDER — ONDANSETRON HCL 4 MG/2ML IJ SOLN
4.0000 mg | Freq: Four times a day (QID) | INTRAMUSCULAR | Status: DC | PRN
  Administered 2011-11-18: 4 mg via INTRAVENOUS
  Filled 2011-11-15: qty 2

## 2011-11-15 MED ORDER — HYDROMORPHONE HCL PF 1 MG/ML IJ SOLN: 1.0000 mg | INTRAMUSCULAR | Status: DC | PRN

## 2011-11-15 MED ORDER — DEXTROSE 5 % IV SOLN
1.0000 g | INTRAVENOUS | Status: DC
  Administered 2011-11-15 – 2011-11-19 (×5): 1 g via INTRAVENOUS
  Filled 2011-11-15 (×6): qty 10

## 2011-11-15 MED ORDER — ENOXAPARIN SODIUM 40 MG/0.4ML ~~LOC~~ SOLN
40.0000 mg | SUBCUTANEOUS | Status: DC
  Administered 2011-11-15 – 2011-11-18 (×4): 40 mg via SUBCUTANEOUS
  Filled 2011-11-15 (×5): qty 0.4

## 2011-11-15 MED ORDER — ALUM & MAG HYDROXIDE-SIMETH 200-200-20 MG/5ML PO SUSP
30.0000 mL | Freq: Four times a day (QID) | ORAL | Status: DC | PRN
  Administered 2011-11-18: 30 mL via ORAL
  Filled 2011-11-15: qty 30

## 2011-11-15 MED ORDER — SODIUM CHLORIDE 0.9 % IV SOLN
INTRAVENOUS | Status: AC
  Administered 2011-11-15: 23:00:00 via INTRAVENOUS

## 2011-11-15 MED ORDER — METOPROLOL TARTRATE 50 MG PO TABS
50.0000 mg | ORAL_TABLET | Freq: Two times a day (BID) | ORAL | Status: DC
  Administered 2011-11-15 – 2011-11-19 (×8): 50 mg via ORAL
  Filled 2011-11-15 (×9): qty 1

## 2011-11-15 MED ORDER — ALLOPURINOL 300 MG PO TABS
300.0000 mg | ORAL_TABLET | Freq: Every day | ORAL | Status: DC
  Administered 2011-11-16 – 2011-11-19 (×4): 300 mg via ORAL
  Filled 2011-11-15 (×4): qty 1

## 2011-11-15 MED ORDER — POLYSACCHARIDE IRON 150 MG PO CAPS
150.0000 mg | ORAL_CAPSULE | Freq: Every day | ORAL | Status: DC
  Administered 2011-11-16 – 2011-11-19 (×4): 150 mg via ORAL
  Filled 2011-11-15 (×9): qty 1

## 2011-11-15 MED ORDER — ENSURE PUDDING PO PUDG
1.0000 | Freq: Three times a day (TID) | ORAL | Status: DC
  Administered 2011-11-16 (×2): 1 via ORAL

## 2011-11-15 MED ORDER — GLIMEPIRIDE 2 MG PO TABS
2.0000 mg | ORAL_TABLET | Freq: Every day | ORAL | Status: DC
  Administered 2011-11-16 – 2011-11-19 (×4): 2 mg via ORAL
  Filled 2011-11-15 (×5): qty 1

## 2011-11-15 MED ORDER — LORATADINE 10 MG PO TABS
10.0000 mg | ORAL_TABLET | Freq: Every day | ORAL | Status: DC
  Administered 2011-11-15 – 2011-11-19 (×5): 10 mg via ORAL
  Filled 2011-11-15 (×5): qty 1

## 2011-11-15 MED ORDER — SIMVASTATIN 20 MG PO TABS
20.0000 mg | ORAL_TABLET | Freq: Every day | ORAL | Status: DC
  Administered 2011-11-15 – 2011-11-18 (×4): 20 mg via ORAL
  Filled 2011-11-15 (×5): qty 1

## 2011-11-15 MED ORDER — OXYCODONE-ACETAMINOPHEN 5-325 MG PO TABS
2.0000 | ORAL_TABLET | Freq: Four times a day (QID) | ORAL | Status: DC | PRN
  Administered 2011-11-15 – 2011-11-17 (×2): 2 via ORAL
  Administered 2011-11-17 – 2011-11-18 (×2): 1 via ORAL
  Filled 2011-11-15 (×4): qty 2
  Filled 2011-11-15: qty 1

## 2011-11-15 MED ORDER — INSULIN GLARGINE 100 UNIT/ML ~~LOC~~ SOLN
15.0000 [IU] | Freq: Every day | SUBCUTANEOUS | Status: DC
  Administered 2011-11-15 – 2011-11-18 (×4): 15 [IU] via SUBCUTANEOUS
  Filled 2011-11-15: qty 3

## 2011-11-15 MED ORDER — SODIUM CHLORIDE 0.9 % IV BOLUS (SEPSIS)
500.0000 mL | Freq: Once | INTRAVENOUS | Status: AC
  Administered 2011-11-15: 500 mL via INTRAVENOUS

## 2011-11-15 MED ORDER — PANTOPRAZOLE SODIUM 20 MG PO TBEC
20.0000 mg | DELAYED_RELEASE_TABLET | Freq: Every day | ORAL | Status: DC
  Administered 2011-11-16 – 2011-11-19 (×4): 20 mg via ORAL
  Filled 2011-11-15 (×4): qty 1

## 2011-11-15 MED ORDER — LORAZEPAM 0.5 MG PO TABS
0.5000 mg | ORAL_TABLET | Freq: Four times a day (QID) | ORAL | Status: DC | PRN
  Administered 2011-11-17 – 2011-11-18 (×2): 0.5 mg via ORAL
  Filled 2011-11-15 (×2): qty 1

## 2011-11-15 MED ORDER — FUROSEMIDE 40 MG PO TABS
40.0000 mg | ORAL_TABLET | Freq: Every day | ORAL | Status: DC
  Administered 2011-11-16: 40 mg via ORAL
  Filled 2011-11-15 (×2): qty 1

## 2011-11-15 MED ORDER — ONDANSETRON HCL 4 MG PO TABS: 4.0000 mg | ORAL_TABLET | Freq: Four times a day (QID) | ORAL | Status: DC | PRN

## 2011-11-15 MED ORDER — PREDNISONE 20 MG PO TABS
40.0000 mg | ORAL_TABLET | Freq: Every day | ORAL | Status: DC
  Administered 2011-11-16 – 2011-11-19 (×4): 40 mg via ORAL
  Filled 2011-11-15 (×5): qty 2

## 2011-11-15 MED ORDER — ACETAMINOPHEN 325 MG PO TABS: 650.0000 mg | ORAL_TABLET | Freq: Four times a day (QID) | ORAL | Status: DC | PRN

## 2011-11-15 MED ORDER — ACETAMINOPHEN 650 MG RE SUPP: 650.0000 mg | Freq: Four times a day (QID) | RECTAL | Status: DC | PRN

## 2011-11-15 MED ORDER — POLYETHYLENE GLYCOL 3350 17 G PO PACK
17.0000 g | PACK | Freq: Every day | ORAL | Status: DC
  Administered 2011-11-17 – 2011-11-19 (×3): 17 g via ORAL
  Filled 2011-11-15 (×4): qty 1

## 2011-11-15 MED ORDER — FLEET ENEMA 7-19 GM/118ML RE ENEM
1.0000 | ENEMA | Freq: Once | RECTAL | Status: AC | PRN
  Filled 2011-11-15: qty 1

## 2011-11-15 MED ORDER — INSULIN ASPART 100 UNIT/ML ~~LOC~~ SOLN
0.0000 [IU] | Freq: Three times a day (TID) | SUBCUTANEOUS | Status: DC
  Administered 2011-11-16 (×2): 2 [IU] via SUBCUTANEOUS
  Filled 2011-11-15 (×2): qty 3

## 2011-11-15 NOTE — ED Notes (Signed)
Called to give report on pt. RN unavailable. Will continue to monitor.

## 2011-11-15 NOTE — ED Notes (Signed)
Pt had iv dc as he was going to go home, now pt is to be readmitted.  Pt is refusing another iv attempt and is agitated.

## 2011-11-15 NOTE — ED Provider Notes (Signed)
Medical screening examination/treatment/procedure(s) were conducted as a shared visit with non-physician practitioner(s) and myself.  I personally evaluated the patient during the encounter  Pt seen and evaluated, awake, alert, no complaints.  Family concerned that he has seemed more weak and confused since recent admission after fall- pt with L1 vertabral fracture- taking percocet for pain.  Does have hx of dementia.  No acute findings during ED workup today.  Symptoms may be related to pain medication.  Pt started on abx for possible UTI (NP has d/w PMD who had + UA from office visit)- urine culture sent here.  Pt discharged to f/u with PMD tomorrow afternoon- appt has been scheduled.   Ethelda Chick, MD 11/15/11 [phone]

## 2011-11-15 NOTE — ED Provider Notes (Signed)
History     CSN: [id number]  Arrival date & time 11/15/11  1215   None     Chief Complaint  Patient presents with  . Altered Mental Status  . Weakness    recent fall, already evaluated s/p fall at [facility].     (Consider location/radiation/quality/duration/timing/severity/associated sxs/prior treatment) Patient is a 78 y.o. male presenting with altered mental status and weakness. The history is provided by the patient. No language interpreter was used.  Altered Mental Status This is a recurrent problem. The problem occurs daily. The problem has been gradually worsening. Associated symptoms include coughing, nausea and weakness. Pertinent negatives include no abdominal pain, chest pain, chills, congestion, fever, headaches, numbness, sore throat, swollen glands, urinary symptoms or vomiting. The symptoms are aggravated by walking. He has tried eating for the symptoms.  Weakness The primary symptoms include altered mental status and nausea. Primary symptoms do not include headaches, fever or vomiting.  Additional symptoms include weakness.    Past Medical History  Diagnosis Date  . Prostate cancer   . Dementia   . Diabetes mellitus   . Hypertension   . Gout   . Hyperlipidemia   . Fracture of lumbar spine 11-08-11    Past Surgical History  Procedure Date  . Left leg surgery   . Right shoulder surgery   . Left cataract extraction     History reviewed. No pertinent family history.  History  Substance Use Topics  . Smoking status: Former Smoker    Types: Cigarettes  . Smokeless tobacco: Never Used  . Alcohol Use: No      Review of Systems  Constitutional: Negative for fever and chills.  HENT: Negative for congestion and sore throat.   Respiratory: Positive for cough.   Cardiovascular: Negative for chest pain.  Gastrointestinal: Positive for nausea. Negative for vomiting and abdominal pain.  Neurological: Positive for weakness. Negative for numbness  and headaches.  Psychiatric/Behavioral: Positive for altered mental status.  All other systems reviewed and are negative.    Allergies  Aspirin and Ibuprofen  Home Medications   Current Outpatient Rx  Name Route Sig Dispense Refill  . ALLOPURINOL 300 MG PO TABS Oral Take 300 mg by mouth daily.    Marland Kitchen CETIRIZINE HCL 10 MG PO TABS Oral Take 10 mg by mouth every evening.    . DSS 100 MG CAPS Oral Take 100 mg by mouth 2 (two) times daily. 10 capsule 0  . FUROSEMIDE 40 MG PO TABS Oral Take 40 mg by mouth daily.    Marland Kitchen GLIMEPIRIDE 2 MG PO TABS Oral Take 2 mg by mouth daily before breakfast.    . INSULIN GLARGINE 100 UNIT/ML [place] SOLN Subcutaneous Inject 15 Units into the skin at bedtime.    Marland Kitchen LANSOPRAZOLE 15 MG PO CPDR Oral Take 15 mg by mouth daily.    Marland Kitchen LORAZEPAM 0.5 MG PO TABS Oral Take 0.5 mg by mouth every 6 (six) hours as needed. Anxiety    . METOPROLOL TARTRATE 50 MG PO TABS Oral Take 50 mg by mouth 2 (two) times daily.    . OXYCODONE-ACETAMINOPHEN 5-325 MG PO TABS Oral Take 1-2 tablets by mouth every 4 (four) hours as needed. 45 tablet 0  . POLYSACCHARIDE IRON 150 MG PO CAPS Oral Take 150 mg by mouth daily.    Marland Kitchen PREDNISONE 10 MG PO TABS Oral Take 30 mg by mouth 2 (two) times daily. Prednisone taper    . RIVASTIGMINE 4.6 MG/24HR TD PT24 Transdermal  Place 1 patch onto the skin daily.    Marland Kitchen SIMVASTATIN 20 MG PO TABS Oral Take 20 mg by mouth at bedtime.      BP 158/77  Pulse 82  Temp(Src) 98.6 F (37 C) (Oral)  Resp 18  SpO2 98%  Physical Exam  Nursing note and vitals reviewed. Constitutional: He appears well-developed and well-nourished.  HENT:  Head: Normocephalic and atraumatic.  Eyes: Pupils are equal, round, and reactive to light.  Neck: Neck supple.  Cardiovascular: Normal rate and regular rhythm.  Exam reveals no gallop and no friction rub.   No murmur heard. Pulmonary/Chest: Breath sounds normal. No respiratory distress.  Abdominal: Soft. He exhibits no distension. There  is no tenderness.  Musculoskeletal: Normal range of motion. He exhibits tenderness.       Lower back pain with tenderness.   Neurological: He is alert. No cranial nerve deficit.       intermittantly confused but easily reoriented  Skin: Skin is warm and dry.  Psychiatric: He has a normal mood and affect.    ED Course  Procedures (including critical care time)   Labs Reviewed  CBC  DIFFERENTIAL  COMPREHENSIVE METABOLIC PANEL  URINALYSIS, ROUTINE W REFLEX MICROSCOPIC   No results found.   No diagnosis found.    MDM   78 year old male presents to the ER via ambulance from time where he lives with his daughter who states she has a decreased LOC and increased confusion. Patient was just discharged from [facility] on the 22nd with a diagnosis of lumbar fracture and incontinent of bowel and bladder. Since then he has deteriorated as far as LOC and ADLs. He has been taking Percocet for pain at home. There is sent in here saying his oxygen saturations were 88 and his heart rate was in the 150s. After watching him in this ER for several hours his sats have been consistently 99-100 and his heart rates have been in the 80s. Labs are unremarkable there is blood in his urine. Spoke with his PCP and she will see him at 1 PM tomorrow. She asked TO start on Bactrim for bacteria that was in his urine at thE office visit this week. Patient will be dispositioned home with a followup at 1 PM tomorrow with his PCP.       Jethro Bastos, NP 11/15/11 1623

## 2011-11-15 NOTE — ED Provider Notes (Signed)
Patient's family now states that he cannot go home. We previously discussed her primary care Dr. who states that the patient had a UTI on the lab work done there. However here did not show up, we will start treatment for it. Patient will be admitted and is amenable to going to rehabilitation.Juliet Rude. Rubin Payor, MD 11/15/11 1755

## 2011-11-15 NOTE — H&P (Signed)
PATIENT DETAILS Name: [name] Age: 78 y.o. Sex: male Date of Birth: 26-Apr-1926 Admit Date: 11/15/2011 ZOX:WRUEAV Wolters   CHIEF COMPLAINT:  Weakness and altered mental status  HPI: Patient is 78 year old with a past medical history of dementia, diabetes, hypertension, who was just recently discharged from [facility] after suffering a fall with resultant lumbar vertebral fracture, patient was offered SNF placement for rehabilitation however family refused. Family went home with home health services. 12 family, ever since discharge patient has had some good days and some bad days. However over the past 2-3 days he has become more confused, this confusion is mostly worse at nights. He has become more weak over the past few days where he needs more assistance to walk. His appetite has also decreased. Family has not noticed a fever. They have not noticed any abdominal pain or nausea or vomiting or diarrhea. Patient is not having shortness of breath or chest pain. Apparently patient has been complaining of left knee pain, patient has a long-standing history of gout, and was taken off prednisone last admission. Per family, whenever patient has pain and swelling of his knees and other joints, he was instructed by his rheumatologist (Dr. Kellie Simmering) to take a tapering dose of prednisone. Patient was then brought to the ED for further evaluation. He was advised for admission however the family initially refused, he was then discharged andthey changed their mind and now want the patient admitted. During my evaluation, patient is awake, although he answers most of my questions he is at times confused.   ALLERGIES:   Allergies  Allergen Reactions  . Aspirin Other (See Comments)    Unknown. Pt does not remember  . Ibuprofen Other (See Comments)    Unknown. Pt does not remember    PAST MEDICAL HISTORY: Past Medical History  Diagnosis Date  . Prostate cancer   . Dementia   . Diabetes  mellitus   . Hypertension   . Gout   . Hyperlipidemia   . Fracture of lumbar spine 11-08-11    PAST SURGICAL HISTORY: Past Surgical History  Procedure Date  . Left leg surgery   . Right shoulder surgery   . Left cataract extraction     MEDICATIONS AT HOME: Prior to Admission medications   Medication Sig Start Date End Date Taking? Authorizing Provider  allopurinol (ZYLOPRIM) 300 MG tablet Take 300 mg by mouth daily.   Yes Historical Provider, MD  cetirizine (ZYRTEC) 10 MG tablet Take 10 mg by mouth every evening.   Yes Historical Provider, MD  docusate sodium (COLACE) 100 MG capsule Take 100 mg by mouth 2 (two) times daily.   Yes Historical Provider, MD  furosemide (LASIX) 40 MG tablet Take 40 mg by mouth daily.   Yes Historical Provider, MD  glimepiride (AMARYL) 2 MG tablet Take 2 mg by mouth daily before breakfast.   Yes Historical Provider, MD  insulin glargine (LANTUS) 100 UNIT/ML injection Inject 15 Units into the skin at bedtime.   Yes Historical Provider, MD  lansoprazole (PREVACID) 15 MG capsule Take 15 mg by mouth daily.   Yes Historical Provider, MD  LORazepam (ATIVAN) 0.5 MG tablet Take 0.5 mg by mouth every 6 (six) hours as needed. Anxiety   Yes Historical Provider, MD  metoprolol (LOPRESSOR) 50 MG tablet Take 50 mg by mouth 2 (two) times daily.   Yes Historical Provider, MD  oxyCODONE-acetaminophen (PERCOCET) 5-325 MG per tablet Take 1 tablet by mouth every 4 (four) hours as needed.  For pain   Yes Historical Provider, MD  polysaccharide iron (NIFEREX) 150 MG CAPS capsule Take 150 mg by mouth daily.   Yes Historical Provider, MD  rivastigmine (EXELON) 4.6 mg/24hr Place 1 patch onto the skin daily.   Yes Historical Provider, MD  simvastatin (ZOCOR) 20 MG tablet Take 20 mg by mouth at bedtime.   Yes Historical Provider, MD  sulfamethoxazole-trimethoprim (SEPTRA DS) 800-160 MG per tablet Take 1 tablet by mouth every 12 (twelve) hours. 11/15/11 11/25/11  Jethro Bastos, NP     FAMILY HISTORY: History reviewed. No pertinent family history.  SOCIAL HISTORY:  reports that he has quit smoking. His smoking use included Cigarettes. He has never used smokeless tobacco. He reports that he does not drink alcohol or use illicit drugs.  REVIEW OF SYSTEMS:  Constitutional:   No  weight loss, night sweats,  Fevers, chills, fatigue.  HEENT:    No headaches, Difficulty swallowing,Tooth/dental problems,Sore throat,  No sneezing, itching, ear ache, nasal congestion, post nasal drip,   Cardio-vascular: No chest pain,  Orthopnea, PND, swelling in lower extremities, anasarca, dizziness, palpitations  GI:  No heartburn, indigestion, abdominal pain, nausea, vomiting, diarrhea, change in bowel habits, loss of appetite  Resp: No shortness of breath with exertion or at rest.  No excess mucus, no productive cough, No non-productive cough,  No coughing up of blood.No change in color of mucus.No wheezing.No chest wall deformity  Skin:  no rash or lesions.  GU:  no dysuria, change in color of urine, no urgency or frequency.  No flank pain.  Musculoskeletal:  No decreased range of motion.   Psych: No change in mood or affect. No depression or anxiety.  No memory loss.   PHYSICAL EXAM: Blood pressure 166/84, pulse 104, temperature 101.9 F (38.8 C), temperature source Oral, resp. rate 18, SpO2 94.00%.  General appearance :Awake, pleasantly confused, not in any distress. Speech Clear. Not toxic Looking HEENT: Atraumatic and Normocephalic, pupils equally reactive to light and accomodation Neck: supple, no JVD. No cervical lymphadenopathy.  Chest:Good air entry bilaterally, no added sounds  CVS: S1 S2 regular, no murmurs.  Abdomen: Bowel sounds present, Non tender and not distended with no gaurding, rigidity or rebound. Extremities: B/L Lower Ext shows no edema, both legs are warm to touch, with  dorsalis pedis pulses palpable.Left knee-swollen, no overlying erythema,  and somewhat tender to palpation.No increased warmth. Neurology: Non focal Skin:No Rash Wounds:N/A  LABS ON ADMISSION:   Basename 11/15/11 1257  NA 137  K 3.8  CL 103  CO2 24  GLUCOSE 226*  BUN 24*  CREATININE 1.49*  CALCIUM 9.7  MG --  PHOS --    Basename 11/15/11 1257  AST 25  ALT 19  ALKPHOS 177*  BILITOT 0.3  PROT 6.3  ALBUMIN 2.5*   No results found for this basename: LIPASE:2,AMYLASE:2 in the last 72 hours  Basename 11/15/11 1257  WBC 13.2*  NEUTROABS 9.7*  HGB 10.6*  HCT 33.5*  MCV 89.3  PLT 235   No results found for this basename: CKTOTAL:3,CKMB:3,CKMBINDEX:3,TROPONINI:3 in the last 72 hours No results found for this basename: DDIMER:2 in the last 72 hours No components found with this basename: POCBNP:3   RADIOLOGIC STUDIES ON ADMISSION: Dg Chest 2 View  11/08/2011  *RADIOLOGY REPORT*  Clinical Data: Status post fall; upper back pain, radiating to both legs.  CHEST - 2 VIEW  Comparison: Chest radiograph performed 07/06/1999 wall  Findings: The lungs are well-aerated.  Mild left basilar scarring  is noted.  There is no evidence of focal opacification, pleural effusion or pneumothorax.  The heart is normal in size; calcification is noted within the aortic arch.  No acute osseous abnormalities are seen.  IMPRESSION: No acute cardiopulmonary process seen.  Original Report Authenticated By: Tonia Ghent, M.D.   Dg Cervical Spine Complete  11/08/2011  *RADIOLOGY REPORT*  Clinical Data: Status post fall; neck pain.  CERVICAL SPINE - COMPLETE 4+ VIEW  Comparison: None.  Findings: There is no evidence of fracture or subluxation. Vertebral bodies demonstrate normal height and alignment.  There is narrowing of the intervertebral disc spaces along the lower cervical spine, with associated anterior and posterior disc osteophyte complexes.  Prevertebral soft tissues are within normal limits.  The provided odontoid view demonstrates no significant abnormality.  The  visualized lung apices are clear.  IMPRESSION:  1.  No evidence of fracture or subluxation along the cervical spine. 2.  Mild degenerative change noted along the lower cervical spine.  Original Report Authenticated By: Tonia Ghent, M.D.   Dg Thoracic Spine 2 View  11/08/2011  *RADIOLOGY REPORT*  Clinical Data: Status post fall, with upper back pain, radiating to both legs.  THORACIC SPINE - 2 VIEW  Comparison: MRI of the thoracic spine performed 04/06/2009, and chest radiograph performed earlier today at 04:22 a.m.  Findings: There is no evidence of fracture or subluxation. Vertebral bodies demonstrate normal height and alignment. Intervertebral disc spaces are preserved, aside from degenerative change along the lower cervical spine.  The visualized portions of both lungs are clear.  The mediastinum is unremarkable in appearance.  IMPRESSION:  1.  No evidence of fracture or subluxation along the thoracic spine. 2.  Mild degenerative change noted along the lower cervical spine.  Original Report Authenticated By: Tonia Ghent, M.D.   Dg Lumbar Spine Complete  11/08/2011  *RADIOLOGY REPORT*  Clinical Data: Status post fall; upper back pain radiating down the back, extending to both legs.  LUMBAR SPINE - COMPLETE 4+ VIEW  Comparison: Lumbar spine radiographs performed 03/20/2011  Findings: There appears to be an acute fracture involving the anterior aspect of vertebral body L1, with disruption of the inferior endplate and a fracture extending to the superior endplate; there is minimal loss of vertebral body height.  No definite retropulsion is seen.  No additional fractures are identified.  There is mild loss of the intervertebral disc spaces along the mid lumbar spine, with associated mild degenerative change.  Mild lateral osteophytes are noted along the lumbar spine.  Mild degenerative change is noted at the lower lumbar spine.  The visualized bowel gas pattern is unremarkable in appearance; air and stool  are noted within the colon.  The sacroiliac joints are within normal limits.  IMPRESSION: Acute fracture involving the anterior aspect of vertebral body L1, with involvement of both endplates and minimal loss of vertebral body height.  No definite evidence for retropulsion.  Original Report Authenticated By: Tonia Ghent, M.D.   Ct Head Wo Contrast  11/15/2011  *RADIOLOGY REPORT*  Clinical Data: Fall last week.  Weakness and lethargy with confusion.  CT HEAD WITHOUT CONTRAST  Technique:  Contiguous axial images were obtained from the base of the skull through the vertex without contrast.  Comparison: .  11/08/2011.  Findings: No skull fracture or intracranial hemorrhage.  No CT evidence of large acute infarct.  No intracranial mass lesion detected on this unenhanced exam.  Global atrophy.  Ventricular prominence probably related to the atrophy rather than hydrocephalus and  without change.  Prominent intracranial vascular calcifications.  IMPRESSION: No skull fracture or intracranial hemorrhage.  No CT evidence of large acute infarct.  Small vessel disease type changes.  Global atrophy.  Original Report Authenticated By: Fuller Canada, M.D.   Ct Head Wo Contrast  11/08/2011  *RADIOLOGY REPORT*  Clinical Data: Status post fall; concern for head injury.  CT HEAD WITHOUT CONTRAST  Technique:  Contiguous axial images were obtained from the base of the skull through the vertex without contrast.  Comparison: CT of the head performed 07/06/2011  Findings: There is no evidence of acute infarction, mass lesion, or intra- or extra-axial hemorrhage on CT.  Prominence of the ventricles and sulci reflects moderate cortical volume loss.  Cerebellar atrophy is noted.  Scattered periventricular and subcortical white matter change likely reflects small vessel ischemic microangiopathy.  The brainstem and fourth ventricle are within normal limits.  The basal ganglia are unremarkable in appearance.  The cerebral hemispheres  demonstrate grossly normal gray-white differentiation. No mass effect or midline shift is seen.  There is no evidence of fracture; degenerative change is noted about the dens.  The visualized portions of the orbits are within normal limits.  The paranasal sinuses and mastoid air cells are well-aerated.  No significant soft tissue abnormalities are seen. Cerumen is noted within both external auditory canals.  IMPRESSION:  1.  No evidence of traumatic intracranial injury or fracture. 2.  Moderate cortical volume loss and scattered small vessel ischemic microangiopathy. 3.  Cerumen noted within both external auditory canals.  Original Report Authenticated By: Tonia Ghent, M.D.   Ct Lumbar Spine Wo Contrast  11/08/2011  *RADIOLOGY REPORT*  Clinical Data: Status post fall; lower back pain.  L1 fracture noted on radiograph; request further evaluation.  CT LUMBAR SPINE WITHOUT CONTRAST  Technique:  Multidetector CT imaging of the lumbar spine was performed without intravenous contrast administration. Multiplanar CT image reconstructions were also generated.  Comparison: Lumbar spine radiographs performed earlier today at 04:33 a.m.  Findings: There is an acute oblique fracture extending across the body of L1, with approximately 60% loss of height.  The fracture extends through both the superior and inferior endplates, but does not appear to involve the posterior aspect of the vertebral body. There is no evidence of retropulsion.  Multiple mildly displaced anterior fragments are noted, more prominent on the right side.  No significant surrounding hematoma is identified.  The surrounding intervertebral disc spaces appear intact.  There is chronic endplate degenerative change at L2-L3, with associated sclerosis and apparent large Schmorl's nodes.  Circumferential disc bulges are noted at L3-L4, L4-L5 and L5-S1, with associated osteophytes. No additional fractures are seen.  Mild facet disease is noted along the lumbar  spine.  Nonspecific perinephric stranding is noted bilaterally.  Scattered calcification is noted along the abdominal aorta and its branches. Scattered diverticulosis is noted along the visualized portions of the colon.  IMPRESSION:  1.  Acute oblique fracture extending across vertebral body L1, with 60% loss of height.  This involves both the superior and inferior endplates, but does not appear to involve the posterior aspect of the vertebral body.  No evidence of retropulsion.  Mildly displaced anterior fragments are more prominent on the right. 2.  Mild degenerative change noted along the lumbar spine. 3.  Scattered calcification along the abdominal aorta and its branches. 4.  Scattered diverticulosis along the visualized portions of the colon.  Original Report Authenticated By: Tonia Ghent, M.D.   Mr Lumbar Spine W  Wo Contrast  11/09/2011  *RADIOLOGY REPORT*  Clinical Data: L1 compression fracture.  MRI LUMBAR SPINE WITHOUT AND WITH CONTRAST  Technique:  Multiplanar and multiecho pulse sequences of the lumbar spine were obtained without and with intravenous contrast.  Contrast: 16mL MULTIHANCE GADOBENATE DIMEGLUMINE 529 MG/ML IV SOLN  Comparison: CT scan 11/08/2011.  Findings: The sagittal MR images demonstrate normal overall alignment of the lumbar vertebral bodies.  Moderate degenerative disc disease and facet disease at all levels.  There is an acute biconcave type L1 compression fracture.  There is some preserved T1 marrow signal suggesting a benign osteoporotic type fracture.  No involvement of the pedicles.  No retropulsion.  The conus medullaris terminates at the top of L1.  No pars defects. The retroperitoneum and paraspinal regions are unremarkable.  A right renal cyst is noted.  L1-2:  No significant findings.  Moderate facet disease.  L2-3:  Diffuse bulging degenerated annulus, short pedicles, facet disease and ligamentum flavum thickening contribute to mild spinal stenosis and moderate, left  greater than right lateral recess stenosis.  There is also mild foraminal stenosis bilaterally, left greater than right.  L3-4:  Diffuse bulging degenerated annulus, osteophytic ridging, short pedicles and facet disease contribute to mild / early spinal stenosis and mild bilateral lateral recess and foraminal stenosis, right greater than left.  L4-5:  Diffuse bulging annulus, osteophytic ridging, short pedicles and facet disease contribute to mild spinal and bilateral lateral recess stenosis and bilateral foraminal stenosis.  A small annular rent and tiny central disc protrusion are also noted.  L5-S1:  Diffuse bulging annulus, short pedicles, severe facet disease and ligamentum flavum thickening contribute to mild spinal and moderate bilateral lateral recess and moderate bilateral foraminal stenosis, right greater than left.  IMPRESSION:  1.  Benign appearing compression fracture of L1.  No retropulsion. 2.  Multilevel disc disease and facet disease contributing to multilevel multifactorial spinal, lateral recess and foraminal stenosis.  Original Report Authenticated By: P. Loralie Champagne, M.D.   Dg Chest Port 1 View  11/15/2011  *RADIOLOGY REPORT*  Clinical Data: Chest pain, shortness of breath, diabetes, past history prostate cancer  PORTABLE CHEST - 1 VIEW  Comparison: Portable exam 1508 hours compared to 11/08/2011  Findings: Enlargement of cardiac silhouette. Calcified tortuous aorta. Pulmonary vascularity normal. Small focus of scarring lateral lower left chest stable. Mild right basilar subsegmental atelectasis. Remaining lungs clear. Bones demineralized.  IMPRESSION: Enlargement of cardiac silhouette. Right basilar subsegmental atelectasis.  Original Report Authenticated By: Lollie Marrow, M.D.    ASSESSMENT AND PLAN: Present on Admission:  .Altered mental status -Not sure whether this is a toxic metabolic encephalopathy or just natural evolution/progression of his dementia  -Per ED documentation,  patient had a urinalysis last week at his primary care practitioner's office that was suggestive of a UTI , as a result we'll empirically start him on some Rocephin and get a urine culture.  -Further plan will depend as patient's clinical course evolves.  Marland KitchenUTI (lower urinary tract infection) -Does have mild leukocytosis, however that is done finding from last admission -Start Rocephin, urine culture sensitivity   .Failure to thrive -PT eval  -Nutrition eval  -Start Ensure   .Dehydration -We'll gently hydrate overnight  -He looks dry with dry mouth /dry oral mucous member  .HYPERTENSION -Continue with Lopressor   .HYPERLIPIDEMIA -Continue with statins   .GOUT flare-left knee -Left knee is somewhat swollen and tender without any overlying erythema or warmth., Patient has a long-standing history of gout, family claims  that current knee is similar to his prior gout flares. As noted above, patient was on when necessary tapering prednisone the last admission and this was discontinued because of compression fracture. -At this point given his chronic kidney disease , we will avoid colchicine and start prednisone with plans to repeat her to stop   .Fracture lumbar vertebra-close, non-surgical -Narcotics as needed   .DIABETES MELLITUS, TYPE II -Continue with Lantus, will add SSI  -Chronic kidney disease stage II-III -Monitor electrolytes periodically  Further plan will depend as patient's clinical course evolves and further radiologic and laboratory data become available. Patient will be monitored closely.  DVT Prophylaxis: Prophylactic Lovenox  Code Status: -Long discussion with daughter and granddaughter at bedside, and they do reconfirm DO NOT RESUSCITATE status  Total time spent for admission equals 45 minutes.  Jeoffrey Massed 11/15/2011, 7:20 PM

## 2011-11-15 NOTE — ED Notes (Signed)
Pt presents from home where he lives with his daughter. Pt had a fall last week on 11-08-11. He fell onto a tile floor. He was transported to [facility] at that time and was evaluated and discharged home. Since his return home pt has continued to have weakness, lethargy, and episodes of confusion. Pt is alert and oriented. Iv started pta by ems.

## 2011-11-15 NOTE — ED Notes (Signed)
Patient has been changed. ptar notified for transport home. Family made aware of plan of care.

## 2011-11-15 NOTE — ED Notes (Signed)
Family has now changed mind about admission to rehab 5 times. She originally stated that she was ok to go home as long as transportation was provided. When nursing had changed, dressed, dc'd iv, called ptar, reviewed discharge information, provided snack, provided chair and changed pt's position. She now states that now she just isn't going to take him home. [facility] and rn made aware of plan. ptar called and transport cancelled.

## 2011-11-15 NOTE — ED Notes (Signed)
RN notified to offer pain medications and urinal, per family request.

## 2011-11-16 ENCOUNTER — Encounter (HOSPITAL_COMMUNITY): Payer: Self-pay | Admitting: General Practice

## 2011-11-16 LAB — CBC
HCT: 32.7 % — ABNORMAL LOW (ref 39.0–52.0)
MCHC: 32.1 g/dL (ref 30.0–36.0)
MCV: 89.4 fL (ref 78.0–100.0)
MCV: 89.1 fL (ref 78.0–100.0)
Platelets: 242 10*3/uL (ref 150–400)
RDW: 13.6 % (ref 11.5–15.5)
RDW: 13.5 % (ref 11.5–15.5)
WBC: 15.9 10*3/uL — ABNORMAL HIGH (ref 4.0–10.5)

## 2011-11-16 LAB — COMPREHENSIVE METABOLIC PANEL
Albumin: 2.6 g/dL — ABNORMAL LOW (ref 3.5–5.2)
BUN: 23 mg/dL (ref 6–23)
Creatinine, Ser: 1.62 mg/dL — ABNORMAL HIGH (ref 0.50–1.35)
Potassium: 4.3 mEq/L (ref 3.5–5.1)
Total Protein: 6.7 g/dL (ref 6.0–8.3)

## 2011-11-16 LAB — GLUCOSE, CAPILLARY
Glucose-Capillary: 186 mg/dL — ABNORMAL HIGH (ref 70–99)
Glucose-Capillary: 205 mg/dL — ABNORMAL HIGH (ref 70–99)

## 2011-11-16 MED ORDER — INSULIN ASPART 100 UNIT/ML ~~LOC~~ SOLN
0.0000 [IU] | Freq: Three times a day (TID) | SUBCUTANEOUS | Status: DC
  Administered 2011-11-16: 5 [IU] via SUBCUTANEOUS
  Administered 2011-11-17: 2 [IU] via SUBCUTANEOUS
  Administered 2011-11-17: 8 [IU] via SUBCUTANEOUS
  Administered 2011-11-17: 5 [IU] via SUBCUTANEOUS
  Administered 2011-11-18: 8 [IU] via SUBCUTANEOUS
  Administered 2011-11-18 – 2011-11-19 (×3): 3 [IU] via SUBCUTANEOUS
  Filled 2011-11-16: qty 3

## 2011-11-16 MED ORDER — HYDRALAZINE HCL 20 MG/ML IJ SOLN
5.0000 mg | Freq: Once | INTRAMUSCULAR | Status: AC
  Administered 2011-11-16: 5 mg via INTRAVENOUS
  Filled 2011-11-16: qty 0.25

## 2011-11-16 MED ORDER — INSULIN ASPART 100 UNIT/ML ~~LOC~~ SOLN
3.0000 [IU] | Freq: Three times a day (TID) | SUBCUTANEOUS | Status: DC
  Administered 2011-11-16 – 2011-11-19 (×8): 3 [IU] via SUBCUTANEOUS

## 2011-11-16 NOTE — Progress Notes (Signed)
Utilization review completed.  

## 2011-11-16 NOTE — Progress Notes (Signed)
INITIAL ADULT NUTRITION ASSESSMENT Date: 11/16/2011   Time: 2:40 PM  Reason for Assessment:Consult  ASSESSMENT: Male 78 y.o.  Dx: Altered mental status  Hx:  Past Medical History  Diagnosis Date  . Prostate cancer   . Dementia   . Diabetes mellitus   . Hypertension   . Gout   . Hyperlipidemia   . Fracture of lumbar spine 11-08-11    Related Meds:     . allopurinol  300 mg Oral Daily  . cefTRIAXone (ROCEPHIN)  IV  1 g Intravenous Q24H  . enoxaparin  40 mg Subcutaneous Q24H  . feeding supplement  1 Container Oral TID WC  . furosemide  40 mg Oral Daily  . glimepiride  2 mg Oral QAC breakfast  . hydrALAZINE  5 mg Intravenous Once  . insulin aspart  0-15 Units Subcutaneous TID WC  . insulin aspart  3 Units Subcutaneous TID WC  . insulin glargine  15 Units Subcutaneous QHS  . loratadine  10 mg Oral Daily  . metoprolol  50 mg Oral BID  . pantoprazole  20 mg Oral Q1200  . polyethylene glycol  17 g Oral Daily  . polysaccharide iron  150 mg Oral Daily  . predniSONE  40 mg Oral Q breakfast  . rivastigmine  4.6 mg Transdermal Daily  . simvastatin  20 mg Oral QHS  . DISCONTD: insulin aspart  0-9 Units Subcutaneous TID WC     Ht: 5\' 11"  (180.3 cm)  Wt: 165 lb 9.1 oz (75.1 kg)  Ideal Wt: 78.2 kg  % Ideal Wt: 95%  Usual Wt: 170 lb (77.3 kg) % Usual Wt: 97%  Body mass index is 23.09 kg/(m^2). WNL  Food/Nutrition Related Hx:  Was admitted with dehydration. MD documented failure to thrive. PO intake variable at 50-100% per doc flowsheets. No wounds to document.   Patient reports weight stays within 165-170 lb range and has currently been stable. He stated he doesn't have much of an appetite, but tries to eat as much as he can. Patient also mentioned that his daughter offers him different supplements at home as a way to increase appetite.   Labs:  CMP     Component Value Date/Time   NA 138 11/16/2011 0645   K 4.3 11/16/2011 0645   CL 104 11/16/2011 0645   CO2 25 11/16/2011  0645   GLUCOSE 178* 11/16/2011 0645   BUN 23 11/16/2011 0645   CREATININE 1.62* 11/16/2011 0645   CALCIUM 9.9 11/16/2011 0645   CALCIUM 8.0* 02/03/2011 0505   PROT 6.7 11/16/2011 0645   ALBUMIN 2.6* 11/16/2011 0645   AST 24 11/16/2011 0645   ALT 21 11/16/2011 0645   ALKPHOS 232* 11/16/2011 0645   BILITOT 0.3 11/16/2011 0645   GFRNONAA 38* 11/16/2011 0645   GFRAA 44* 11/16/2011 0645   CBG (last 3)   Basename 11/16/11 0740 11/15/11 2250  GLUCAP 186* 333*    Lab Results  Component Value Date   HGBA1C 11.9* 11/08/2011    Intake:   Intake/Output Summary (Last 24 hours) at 11/16/11 1444 Last data filed at 11/16/11 0955  Gross per 24 hour  Intake 442.67 ml  Output      0 ml  Net 442.67 ml     Diet Order: Carb Control Medium 1600-2000, Low fiber: no raw vegetables or salad  Supplements/Tube Feeding: Ensure pudding TID with meals  IVF:    sodium chloride Last Rate: 40 mL/hr at 11/16/11 0645    Estimated Nutritional Needs:  Kcal: 2000 - 2200 Protein: 56 - 66 gm Fluid: > 2 L  Patient reported consuming Ensure pudding, but is not too fond of taste. Patient agreed to try MagicCup dessert supplement as an alternative.  NUTRITION DIAGNOSIS: -Inadequate oral intake (NI-2.1).  Status: Ongoing  RELATED TO: decreased appetite  AS EVIDENCE BY: variable PO intake  MONITORING/EVALUATION(Goals): Goal: Consume >/= 90% of estimated needs Monitor: PO intake, weights, supplement tolerance, labs  EDUCATION NEEDS: -No education needs identified at this time  INTERVENTION:  Discontinue Ensure pudding supplement   RD to add MagicCup dessert supplement BID to provide 290 kcal, and 9 grams protein/4 oz.  RD to follow nutrition care plan  Dietitian #:[phone]  DOCUMENTATION CODES Per approved criteria  -Not Applicable    [name] Pager #: [phone]  [name] 11/16/2011, 2:40 PM

## 2011-11-16 NOTE — Evaluation (Signed)
Physical Therapy Evaluation Patient Details Name: [name] MRN: [id number] DOB: 07/31/1926 Today's Date: 11/16/2011  Problem List:  Patient Active Problem List  Diagnoses  . DIABETES MELLITUS, TYPE II  . HYPERLIPIDEMIA  . GOUT  . ANXIETY  . HYPERTENSION  . GERD  . OSTEOARTHRITIS  . ABNORMAL ELECTROCARDIOGRAM  . PROSTATE CANCER, HX OF  . Divertic bleed 2004-sees [facility]  . Fracture lumbar vertebra-close, non-surgical  . Fall at home  . Altered mental status  . UTI (lower urinary tract infection)  . Failure to thrive  . Dehydration    Past Medical History:  Past Medical History  Diagnosis Date  . Prostate cancer   . Dementia   . Diabetes mellitus   . Hypertension   . Gout   . Hyperlipidemia   . Fracture of lumbar spine 11-08-11   Past Surgical History:  Past Surgical History  Procedure Date  . Left leg surgery   . Right shoulder surgery   . Left cataract extraction     PT Assessment/Plan/Recommendation PT Assessment Clinical Impression Statement: Pt adm with weakness and ams.  Pt recently dc'd from [facility] after L1 compression fx.  Pt requiring incr assistance at home.  Recommend ST-SNF. PT Recommendation/Assessment: Patient will need skilled PT in the acute care venue PT Problem List: Decreased strength;Decreased activity tolerance;Decreased balance;Decreased mobility;Decreased knowledge of use of DME;Pain PT Therapy Diagnosis : Difficulty walking;Generalized weakness PT Plan PT Frequency: Min 2X/week PT Treatment/Interventions: DME instruction;Gait training;Functional mobility training;Therapeutic activities;Therapeutic exercise;Balance training;Patient/family education PT Recommendation Follow Up Recommendations: Skilled nursing facility Equipment Recommended: Defer to next venue PT Goals  Acute Rehab PT Goals PT Goal Formulation: With patient Time For Goal Achievement: 7 days Pt will Roll Supine to Right Side: with supervision PT Goal: Rolling Supine to  Right Side - Progress: Goal set today Pt will go Supine/Side to Sit: with supervision PT Goal: Supine/Side to Sit - Progress: Goal set today Pt will go Sit to Supine/Side: with supervision PT Goal: Sit to Supine/Side - Progress: Goal set today Pt will go Sit to Stand: with supervision PT Goal: Sit to Stand - Progress: Goal set today Pt will go Stand to Sit: with supervision PT Goal: Stand to Sit - Progress: Goal set today Pt will Ambulate: 51 - 150 feet;with supervision;with least restrictive assistive device PT Goal: Ambulate - Progress: Goal set today  PT Evaluation Precautions/Restrictions  Precautions Precautions: Fall Prior Functioning  Home Living Lives With: Family Receives Help From: Family Type of Home: Apartment Home Layout: One level Home Access: Ramped entrance Home Adaptive Equipment: Walker - rolling;Bedside commode/3-in-1;Shower chair with back Prior Function Level of Independence: Needs assistance with ADLs;Needs assistance with gait;Needs assistance with tranfers Cognition Cognition Arousal/Alertness: Awake/alert Overall Cognitive Status: History of cognitive impairments History of Cognitive Impairment: Appears at baseline functioning Orientation Level: Oriented to person;Oriented to place Sensation/Coordination   Extremity Assessment RLE Strength RLE Overall Strength Comments: grossly 4/5 LLE Strength LLE Overall Strength Comments: Grossly 4/5 Mobility (including Balance) Bed Mobility Rolling Right: 4: Min assist Rolling Right Details (indicate cue type and reason): verbal cues for technique Right Sidelying to Sit: 4: Min assist Right Sidelying to Sit Details (indicate cue type and reason): assist to bring trunk up Sitting - Scoot to Edge of Bed: 4: Min assist Transfers Sit to Stand: 3: Mod assist;From bed;With upper extremity assist Sit to Stand Details (indicate cue type and reason): tactile/verbal cues for hand placement Stand to Sit: 4: Min  assist;With upper extremity assist;With armrests;To chair/3-in-1  Stand to Sit Details: assist to control descent and cues for hand placement Ambulation/Gait Ambulation/Gait Assistance: 4: Min assist Ambulation/Gait Assistance Details (indicate cue type and reason): Verbal cues to stand more erect Ambulation Distance (Feet): 60 Feet Assistive device: Rolling walker Gait Pattern: Decreased step length - right;Decreased step length - left  Static Standing Balance Static Standing - Balance Support: Bilateral upper extremity supported (on walker) Static Standing - Level of Assistance: 4: Min assist Exercise    End of Session PT - End of Session Equipment Utilized During Treatment: Gait belt Activity Tolerance: Patient tolerated treatment well Patient left: in chair;with call bell in reach;Other (comment) (chair alarm on)  Saphira Lahmann 11/16/2011, 12:52 PM  [facility] PT [phone]

## 2011-11-16 NOTE — Progress Notes (Signed)
Pt had an elevated b/p of 161/98. Craige Cotta was advised. Will continue to monitor.

## 2011-11-16 NOTE — Progress Notes (Addendum)
Patient seen and examined today.  assessment and plan outlined above reviewed.  still has swelling over knees, ankles and left finger but feels better than yesterday  cont po prednisone 40 mg daily. Total 5 days should be effective cont allopurinol  possible UTI on UA, cont abx,  Seen by PT and recommends SNF   Lasix held

## 2011-11-16 NOTE — Progress Notes (Signed)
Patient ID: [name], male   DOB: [birth year], 78 y.o.   MRN: [id number]  Subjective: No complaints   Objective: Weight change:   Intake/Output Summary (Last 24 hours) at 11/16/11 1313 Last data filed at 11/16/11 0955  Gross per 24 hour  Intake 442.67 ml  Output      0 ml  Net 442.67 ml   Blood pressure 156/73, pulse 86, temperature 97.9 F (36.6 C), temperature source Oral, resp. rate 18, height 5\' 11"  (1.803 m), weight 75.1 kg (165 lb 9.1 oz), SpO2 98.00%. Filed Vitals:   11/15/11 1951 11/15/11 2115 11/16/11 0649 11/16/11 1049  BP:  116/65 178/91 156/73  Pulse:  97 80 86  Temp: 101.9 F (38.8 C) 99.9 F (37.7 C) 97.9 F (36.6 C)   TempSrc:   Oral   Resp:  18 18   Height:  5\' 11"  (1.803 m)    Weight:  75.1 kg (165 lb 9.1 oz)    SpO2:  93% 98%     Physical Exam: General: No acute distress, sitting in chair.  A&Ox3. Very pleasant Lungs: Clear to auscultation bilaterally without wheezes or crackles Cardiovascular: Regular rate and rhythm without murmur gallop or rub normal S1 and S2 Abdomen: Nontender, nondistended, soft, bowel sounds positive, no rebound, no ascites, no appreciable mass Extremities: Left knee warm with swelling, 3rd finger on right hand swollen with gout in MCP  Basic Metabolic Panel:  Lab 11/16/11 0102 11/15/11 2138 11/15/11 1257  NA 138 -- 137  K 4.3 -- 3.8  CL 104 -- 103  CO2 25 -- 24  GLUCOSE 178* -- 226*  BUN 23 -- 24*  CREATININE 1.62* 1.62* --  CALCIUM 9.9 -- 9.7  MG -- -- --  PHOS -- -- --   Liver Function Tests:  Lab 11/16/11 0645 11/15/11 1257  AST 24 25  ALT 21 19  ALKPHOS 232* 177*  BILITOT 0.3 0.3  PROT 6.7 6.3  ALBUMIN 2.6* 2.5*   CBC:  Lab 11/16/11 0645 11/15/11 2138 11/15/11 1257  WBC 14.5* 15.9* --  NEUTROABS -- -- 9.7*  HGB 10.5* 10.0* --  HCT 32.7* 31.2* --  MCV 89.1 89.4 --  PLT 262 242 --   CBG:  Lab 11/16/11 0740 11/15/11 2250 11/10/11 1127 11/10/11 0733 11/09/11 2200 11/09/11 1653  GLUCAP 186* 333*  217* 142* 217* 231*   Urine Drug Screen: Drugs of Abuse     Component Value Date/Time   LABOPIA NONE DETECTED 06/20/2010 0018   COCAINSCRNUR NONE DETECTED 06/20/2010 0018   LABBENZ NONE DETECTED 06/20/2010 0018   AMPHETMU NONE DETECTED 06/20/2010 0018   THCU NONE DETECTED 06/20/2010 0018   LABBARB  Value: NONE DETECTED        DRUG SCREEN FOR MEDICAL PURPOSES ONLY.  IF CONFIRMATION IS NEEDED FOR ANY PURPOSE, NOTIFY LAB WITHIN 5 DAYS.        LOWEST DETECTABLE LIMITS FOR URINE DRUG SCREEN Drug Class       Cutoff (ng/mL) Amphetamine      1000 Barbiturate      200 Benzodiazepine   200 Tricyclics       300 Opiates          300 Cocaine          300 THC              50 06/20/2010 0018     Studies/Results: Scheduled Meds:   . allopurinol  300 mg Oral Daily  . cefTRIAXone (ROCEPHIN)  IV  1 g Intravenous Q24H  . enoxaparin  40 mg Subcutaneous Q24H  . feeding supplement  1 Container Oral TID WC  . furosemide  40 mg Oral Daily  . glimepiride  2 mg Oral QAC breakfast  . hydrALAZINE  5 mg Intravenous Once  . insulin aspart  0-9 Units Subcutaneous TID WC  . insulin glargine  15 Units Subcutaneous QHS  . loratadine  10 mg Oral Daily  . metoprolol  50 mg Oral BID  . pantoprazole  20 mg Oral Q1200  . polyethylene glycol  17 g Oral Daily  . polysaccharide iron  150 mg Oral Daily  . predniSONE  40 mg Oral Q breakfast  . rivastigmine  4.6 mg Transdermal Daily  . simvastatin  20 mg Oral QHS  . sodium chloride  500 mL Intravenous Once   Continuous Infusions:   . sodium chloride 40 mL/hr at 11/16/11 0645   PRN Meds:.acetaminophen, acetaminophen, albuterol, alum & mag hydroxide-simeth, HYDROmorphone, LORazepam, ondansetron (ZOFRAN) IV, ondansetron, oxyCODONE-acetaminophen, sodium phosphate  Anti-infectives:  Anti-infectives     Start     Dose/Rate Route Frequency Ordered Stop   11/15/11 2200   cefTRIAXone (ROCEPHIN) 1 g in dextrose 5 % 50 mL IVPB        1 g 100 mL/hr over 30 Minutes Intravenous Every  24 hours 11/15/11 2108     11/15/11 0000  sulfamethoxazole-trimethoprim (SEPTRA DS) 800-160 MG per tablet       1 tablet Oral Every 12 hours 11/15/11 1553 11/25/11 2359           Present on Admission:  .Altered mental status - resolved  Possibly due to Urinary tract infection, fever, prednisone, or less likely elevated blood sugar.  Marland KitchenUTI (lower urinary tract infection)  - Fever on 2/28.  Resolved 3/1. -Does have mild leukocytosis, - Rocephin started at admission  urine culture sensitivity pending.  Elevated Creatinine - Secondary to UTI - Continue gentle IVF recheck bmet in am.  .Failure to thrive  -PT eval  -Nutrition eval  -Start Ensure   .Dehydration  - resolved.  Marland KitchenHYPERTENSION  -Continue with Lopressor.  Continue to monitor.  150s - 170s today.  If they continue to be elevated would increase metoprolol 3/2.  Marland KitchenHYPERLIPIDEMIA  -Continue with statins   .GOUT flare-left knee and right 3rd finger -Left knee is somewhat swollen and tender without any overlying erythema or warmth., Patient has a long-standing history of gout, family claims that current knee is similar to his prior gout flares. As noted above, patient was on when necessary tapering prednisone the last admission and this was discontinued because of compression fracture.  -At this point given his chronic kidney disease , we will avoid colchicine and start prednisone with plans to repeat her to stop  - on Prednisone 40 mg and Allopurinol  .Fracture lumbar vertebra-close, non-surgical  -Narcotics as needed   .DIABETES MELLITUS, TYPE II  -Not well controlled - possibly steroid induced.  Will add meal coverage and increase SSI to moderate.  On glimiperide as well.  -Chronic kidney disease stage II-III  - with rise in creatinine to 1.62 on 3/1.  Baseline appears to be 1.3 - 1.5. - IV Fluids @ 40 ml/ hour   DVT Prophylaxis:  Prophylactic Lovenox   Code Status:   DO NOT RESUSCITATE status   LOS: 1 day    Stephani Police 11/16/2011, 1:13 PM [phone]

## 2011-11-17 LAB — GLUCOSE, CAPILLARY
Glucose-Capillary: 139 mg/dL — ABNORMAL HIGH (ref 70–99)
Glucose-Capillary: 263 mg/dL — ABNORMAL HIGH (ref 70–99)

## 2011-11-17 MED ORDER — WHITE PETROLATUM GEL
Status: AC
  Administered 2011-11-17: 14:00:00
  Filled 2011-11-17: qty 5

## 2011-11-17 MED ORDER — SODIUM CHLORIDE 0.9 % IV SOLN
INTRAVENOUS | Status: DC
  Administered 2011-11-17 – 2011-11-19 (×4): via INTRAVENOUS

## 2011-11-17 MED ORDER — COLCHICINE 0.6 MG PO TABS: 0.6000 mg | ORAL_TABLET | Freq: Three times a day (TID) | ORAL | Status: DC

## 2011-11-17 MED ORDER — METHYLPREDNISOLONE SODIUM SUCC 125 MG IJ SOLR: 60.0000 mg | Freq: Once | INTRAMUSCULAR | Status: DC

## 2011-11-17 NOTE — Plan of Care (Signed)
Problem: Phase II Progression Outcomes Goal: Discharge plan established Outcome: Completed/Met Date Met:  11/17/11 Rehab daughter has accepted abed at [facility]

## 2011-11-17 NOTE — Progress Notes (Signed)
Subjective: Patient seen and examined this am . informs feeling better, pain in knees and legs better  Objective:  Vital signs in last 24 hours:  Filed Vitals:   11/16/11 1337 11/16/11 2110 11/17/11 0527 11/17/11 0939  BP: 148/73 130/71 128/66 120/67  Pulse: 76 76 78 89  Temp: 99.1 F (37.3 C) 98.5 F (36.9 C) 98.2 F (36.8 C)   TempSrc:      Resp: 19 18 18    Height:      Weight:      SpO2: 99% 97% 97%     Intake/Output from previous day:   Intake/Output Summary (Last 24 hours) at 11/17/11 1503 Last data filed at 11/17/11 0900  Gross per 24 hour  Intake    850 ml  Output    126 ml  Net    724 ml    Physical Exam:  General: elderly male in no acute distress,  Lungs: Clear to auscultation bilaterally without wheezes or crackles  Cardiovascular: Regular rate and rhythm without murmur gallop or rub normal S1 and S2  Abdomen: Nontender, nondistended, soft, bowel sounds positive, no rebound, no ascites, no appreciable mass  Extremities: swelling over eft knee and ankle much improved with good ROM and non tender CNS: AAOX 3 , non fcoal  Lab Results:  Basic Metabolic Panel:    Component Value Date/Time   NA 138 11/16/2011 0645   K 4.3 11/16/2011 0645   CL 104 11/16/2011 0645   CO2 25 11/16/2011 0645   BUN 23 11/16/2011 0645   CREATININE 1.62* 11/16/2011 0645   GLUCOSE 178* 11/16/2011 0645   CALCIUM 9.9 11/16/2011 0645   CALCIUM 8.0* 02/03/2011 0505   CBC:    Component Value Date/Time   WBC 14.5* 11/16/2011 0645   HGB 10.5* 11/16/2011 0645   HCT 32.7* 11/16/2011 0645   PLT 262 11/16/2011 0645   MCV 89.1 11/16/2011 0645   NEUTROABS 9.7* 11/15/2011 1257   LYMPHSABS 1.9 11/15/2011 1257   MONOABS 1.3* 11/15/2011 1257   EOSABS 0.3 11/15/2011 1257   BASOSABS 0.0 11/15/2011 1257    No results found for this or any previous visit (from the past 240 hour(s)).  Studies/Results: Dg Chest Port 1 View  11/15/2011  *RADIOLOGY REPORT*  Clinical Data: Chest pain, shortness of breath, diabetes,  past history prostate cancer  PORTABLE CHEST - 1 VIEW  Comparison: Portable exam 1508 hours compared to 11/08/2011  Findings: Enlargement of cardiac silhouette. Calcified tortuous aorta. Pulmonary vascularity normal. Small focus of scarring lateral lower left chest stable. Mild right basilar subsegmental atelectasis. Remaining lungs clear. Bones demineralized.  IMPRESSION: Enlargement of cardiac silhouette. Right basilar subsegmental atelectasis.  Original Report Authenticated By: Lollie Marrow, M.D.    Medications: Scheduled Meds:   . allopurinol  300 mg Oral Daily  . cefTRIAXone (ROCEPHIN)  IV  1 g Intravenous Q24H  . colchicine  0.6 mg Oral Q8H  . enoxaparin  40 mg Subcutaneous Q24H  . glimepiride  2 mg Oral QAC breakfast  . insulin aspart  0-15 Units Subcutaneous TID WC  . insulin aspart  3 Units Subcutaneous TID WC  . insulin glargine  15 Units Subcutaneous QHS  . loratadine  10 mg Oral Daily  . methylPREDNISolone (SOLU-MEDROL) injection  60 mg Intravenous Once  . metoprolol  50 mg Oral BID  . pantoprazole  20 mg Oral Q1200  . polyethylene glycol  17 g Oral Daily  . polysaccharide iron  150 mg Oral Daily  . predniSONE  40 mg Oral Q breakfast  . rivastigmine  4.6 mg Transdermal Daily  . simvastatin  20 mg Oral QHS  . white petrolatum      . DISCONTD: feeding supplement  1 Container Oral TID WC  . DISCONTD: furosemide  40 mg Oral Daily   Continuous Infusions:  PRN Meds:.acetaminophen, acetaminophen, albuterol, alum & mag hydroxide-simeth, HYDROmorphone, LORazepam, ondansetron (ZOFRAN) IV, ondansetron, oxyCODONE-acetaminophen  Assessment/ 78 y/o male with hx of DM, CKD, gout , lumbar vertebral fx, HTN admitted with AMS in the setting of UTI   Plan: .Altered mental status  - resolved  Possibly due to Urinary tract infection   .UTI (lower urinary tract infection)  - Fever on 2/28. Resolved 3/1.  -Does have  leukocytosis, which is improving  - Rocephin started at admission  urine culture sensitivity pending.   Elevated Creatinine  - Secondary to UTI  And dehydration - Continue gentle IVF Monitor daily   .Failure to thrive  -PT eval  -cont ensure  d/c to SNF once stable  .Dehydration  - resolved.   Marland KitchenHYPERTENSION  -Continue with Lopressor.  BP stable  .HYPERLIPIDEMIA  -Continue with statins   .GOUT flare-left knee and right 3rd finger  Patient has a long-standing history of gout, family claims that current knee is similar to his prior gout flares.  patient was on when necessary tapering prednisone the last admission and this was discontinued because of compression fracture.  -on po prednisone 40 mg daily for 5 days ( day 3) . symptoms much improved   .Fracture lumbar vertebra-close, non-surgical  -pain meds prn  .DIABETES MELLITUS, TYPE II  -Not well controlled - possibly steroid induced.  Cont SSI and glimiperide   -Chronic kidney disease stage II-III  . Baseline appears to be 1.3 - 1.5.  - IV NS @75  CC/hr  DVT Prophylaxis:   Lovenox   DNR   dispo   to SNF likely on Monday     LOS: 2 days   [name] 11/17/2011, 3:03 PM

## 2011-11-17 NOTE — Progress Notes (Signed)
Clinical Social Worker provided list of bed offers to pt and family.  CSW encouraged family members to visit/look at facilities to be able to choose bed in time for dc.  CSW to continue to follow and assist as needed.  Angelia Mould, MSW, [place] [phone]

## 2011-11-17 NOTE — Progress Notes (Signed)
Pt's daughter Kym Groom states she has chosen a bed at [facility]. Julien Nordmann [facility]

## 2011-11-18 LAB — BASIC METABOLIC PANEL
CO2: 25 mEq/L (ref 19–32)
Calcium: 10.3 mg/dL (ref 8.4–10.5)
Creatinine, Ser: 1.51 mg/dL — ABNORMAL HIGH (ref 0.50–1.35)
GFR calc Af Amer: 47 mL/min — ABNORMAL LOW (ref >90)
GFR calc non Af Amer: 41 mL/min — ABNORMAL LOW (ref >90)
Sodium: 139 mEq/L (ref 135–145)

## 2011-11-18 LAB — GLUCOSE, CAPILLARY
Glucose-Capillary: 300 mg/dL — ABNORMAL HIGH (ref 70–99)
Glucose-Capillary: 163 mg/dL — ABNORMAL HIGH (ref 70–99)
Glucose-Capillary: 172 mg/dL — ABNORMAL HIGH (ref 70–99)
Glucose-Capillary: 262 mg/dL — ABNORMAL HIGH (ref 70–99)
Glucose-Capillary: 255 mg/dL — ABNORMAL HIGH (ref 70–99)

## 2011-11-18 NOTE — Progress Notes (Signed)
Patient refuses nursing staff help to bed side commode or bathing; patient educated on proper safety measures and fall risks.  Marrion Finan, Joan Mayans, RN

## 2011-11-18 NOTE — Progress Notes (Signed)
Spoke with daughter, [name], by phone- and they have selected [facility]. Will advise weekday CSW for further f/u. Reece Levy, MSW, LCSWA [phone]/weekend coverage

## 2011-11-18 NOTE — Progress Notes (Signed)
Subjective: Patient seen and examined this am. Feels better. No overnight issues  Objective:  Vital signs in last 24 hours:  Filed Vitals:   11/17/11 1500 11/17/11 2125 11/18/11 0600 11/18/11 0903  BP: 171/62 148/66 153/82 159/78  Pulse: 72 80 91 74  Temp: 98.1 F (36.7 C) 98.2 F (36.8 C) 98.1 F (36.7 C)   TempSrc: Oral Oral Oral   Resp: 18 18 19    Height:      Weight:      SpO2: 94% 95% 91%     Intake/Output from previous day:   Intake/Output Summary (Last 24 hours) at 11/18/11 1306 Last data filed at 11/18/11 0912  Gross per 24 hour  Intake 1772.5 ml  Output    103 ml  Net 1669.5 ml    Physical Exam:  General: elderly male in no acute distress,  Lungs: Clear to auscultation bilaterally without wheezes or crackles  Cardiovascular: Regular rate and rhythm without murmur gallop or rub normal S1 and S2  Abdomen: Nontender, nondistended, soft, bowel sounds positive, no rebound, no ascites, no appreciable mass  Extremities: swelling over eft knee and ankle much improved with good ROM and non tender  CNS: AAOX 3 , non fcoal    Lab Results:  Basic Metabolic Panel:    Component Value Date/Time   NA 138 11/16/2011 0645   K 4.3 11/16/2011 0645   CL 104 11/16/2011 0645   CO2 25 11/16/2011 0645   BUN 23 11/16/2011 0645   CREATININE 1.62* 11/16/2011 0645   GLUCOSE 178* 11/16/2011 0645   CALCIUM 9.9 11/16/2011 0645   CALCIUM 8.0* 02/03/2011 0505   CBC:    Component Value Date/Time   WBC 14.5* 11/16/2011 0645   HGB 10.5* 11/16/2011 0645   HCT 32.7* 11/16/2011 0645   PLT 262 11/16/2011 0645   MCV 89.1 11/16/2011 0645   NEUTROABS 9.7* 11/15/2011 1257   LYMPHSABS 1.9 11/15/2011 1257   MONOABS 1.3* 11/15/2011 1257   EOSABS 0.3 11/15/2011 1257   BASOSABS 0.0 11/15/2011 1257    No results found for this or any previous visit (from the past 240 hour(s)).  Studies/Results: No results found.  Medications: Scheduled Meds:   . allopurinol  300 mg Oral Daily  . cefTRIAXone (ROCEPHIN)  IV   1 g Intravenous Q24H  . enoxaparin  40 mg Subcutaneous Q24H  . glimepiride  2 mg Oral QAC breakfast  . insulin aspart  0-15 Units Subcutaneous TID WC  . insulin aspart  3 Units Subcutaneous TID WC  . insulin glargine  15 Units Subcutaneous QHS  . loratadine  10 mg Oral Daily  . metoprolol  50 mg Oral BID  . pantoprazole  20 mg Oral Q1200  . polyethylene glycol  17 g Oral Daily  . polysaccharide iron  150 mg Oral Daily  . predniSONE  40 mg Oral Q breakfast  . rivastigmine  4.6 mg Transdermal Daily  . simvastatin  20 mg Oral QHS  . white petrolatum      . DISCONTD: colchicine  0.6 mg Oral Q8H  . DISCONTD: methylPREDNISolone (SOLU-MEDROL) injection  60 mg Intravenous Once   Continuous Infusions:   . sodium chloride 75 mL/hr at 11/18/11 0709   PRN Meds:.acetaminophen, acetaminophen, albuterol, alum & mag hydroxide-simeth, HYDROmorphone, LORazepam, ondansetron (ZOFRAN) IV, ondansetron, oxyCODONE-acetaminophen  Assessment/  78 y/o male with hx of DM, CKD, gout , lumbar vertebral fx, HTN admitted with AMS in the setting of UTI   Plan:  .Altered mental status  -  resolved  Possibly due to Urinary tract infection   .UTI (lower urinary tract infection)  - Fever on 2/28. Resolved 3/1.  -Does have leukocytosis, which is improving , but pt now on prednisone as well  - Rocephin started on  Admission ( day 4) .  urine culture negative   Elevated Creatinine  - Secondary to UTI And dehydration  - Continue gentle IVF    .Failure to thrive  -PT eval  -cont ensure  d/c to SNF once stable   .Dehydration  - resolved.   Marland KitchenHYPERTENSION  -Continue with Lopressor.  BP stable   .HYPERLIPIDEMIA  -Continue with statins   .GOUT flare-left knee and right 3rd finger  Patient has a long-standing history of gout, family claims that current knee is similar to his prior gout flares.  patient was on when necessary tapering prednisone the last admission and this was discontinued because of  compression fracture.  -on po prednisone 40 mg daily for 5 days ( day 4) . symptoms much improved   .Fracture lumbar vertebra-close, non-surgical  -pain meds prn   .DIABETES MELLITUS, TYPE II  -Not well controlled - possibly steroid induced.  Cont SSI and glimiperide   -Chronic kidney disease stage II-III  . Baseline appears to be 1.3 - 1.5.  - IV NS @75  CC/hr   DVT Prophylaxis:  Lovenox   DNR   dispo  to SNF likely on Monday       LOS: 3 days   Laneah Luft 11/18/2011, 1:06 PM

## 2011-11-19 MED ORDER — PREDNISONE 20 MG PO TABS: 40.0000 mg | ORAL_TABLET | Freq: Every day | ORAL | Status: AC

## 2011-11-19 MED ORDER — LORAZEPAM 0.5 MG PO TABS: 0.5000 mg | ORAL_TABLET | Freq: Four times a day (QID) | ORAL | Status: AC | PRN

## 2011-11-19 MED ORDER — HYDRALAZINE HCL 20 MG/ML IJ SOLN
5.0000 mg | Freq: Four times a day (QID) | INTRAMUSCULAR | Status: DC | PRN
  Administered 2011-11-19: 5 mg via INTRAVENOUS
  Filled 2011-11-19: qty 0.25

## 2011-11-19 MED ORDER — ACETAMINOPHEN 325 MG PO TABS: 650.0000 mg | ORAL_TABLET | Freq: Four times a day (QID) | ORAL | Status: DC | PRN

## 2011-11-19 MED ORDER — POLYETHYLENE GLYCOL 3350 17 G PO PACK: 17.0000 g | PACK | Freq: Every day | ORAL | Status: AC

## 2011-11-19 MED ORDER — OXYCODONE-ACETAMINOPHEN 5-325 MG PO TABS: 2.0000 | ORAL_TABLET | Freq: Four times a day (QID) | ORAL | Status: AC | PRN

## 2011-11-19 MED ORDER — TAMSULOSIN HCL 0.4 MG PO CAPS: 0.4000 mg | ORAL_CAPSULE | Freq: Every day | ORAL | Status: DC

## 2011-11-19 MED ORDER — TAMSULOSIN HCL 0.4 MG PO CAPS
0.4000 mg | ORAL_CAPSULE | Freq: Every day | ORAL | Status: DC
  Administered 2011-11-19: 0.4 mg via ORAL
  Filled 2011-11-19: qty 1

## 2011-11-19 NOTE — Progress Notes (Signed)
Patients skin is clean, dry and intact, no evidence of skin break down. IV site discontinued, catheter remains intact, site without signs and symptoms of complications. Dressing and pressure applied. Pt currently awaiting [facility] for transport to SNF. Will continue to monitor through discharge. [name] [facility]

## 2011-11-19 NOTE — Progress Notes (Signed)
CSW spoke with pt's dtr, [name], who confirmed [facility] choice. Pt has bed today at SNF. Pt's dtr to meet at SNF to complete ppw at 2:00pm. Pt to d/c today via PT[facility]. No other CSW needs reported or noted. CSW signing off.  Dellie Burns, MSW, LCSWA [phone] (cover)

## 2011-11-19 NOTE — Plan of Care (Signed)
Problem: Phase III Progression Outcomes Goal: Discharge plan remains appropriate-arrangements made Outcome: Completed/Met Date Met:  11/19/11 Pt to discharge to [facility]

## 2011-11-19 NOTE — Progress Notes (Signed)
Josie, CSW notified that pts daughter, [name], is requesting a call and daughter's phone number provided. Julien Nordmann [facility]

## 2011-11-19 NOTE — Progress Notes (Signed)
Pt transported to SNF on stretcher, packet sent with PT[facility] transporters. No distress noted on discharge.   [name] [facility]

## 2011-11-19 NOTE — Discharge Summary (Signed)
Physician Discharge Summary  Patient ID: DELVIS KAU MRN: [id number] DOB/AGE: December 22, 1925 78 y.o.  Admit date: 11/15/2011 Discharge date: 11/19/2011    Discharge Diagnoses:  1. Acute Encephalopathy, resolved 2. UTI 3. Acute kidney injury d/t dehydration 4. Acute gout flare, left knee & right finger 5. FTT 6. Hypertension 7. Hyperlipidemia 8. Lumbar fx, nonsurgical 9. DM2 10. CKD, stage II-III 11. Dementia 12. Hx prostate cancer   Brief Summary: [name] is a 78 y.o. y/o male with a PMH of dementia, diabetes, hypertension, who was just recently discharged from [facility] on 2/23 after suffering a fall with resultant lumbar vertebral fracture, patient was offered SNF placement for rehabilitation however family refused. Family went home with home health services and while at home patient had some good days and some bad days. However, 2-3 days prior to admit he became more confused. This confusion is mostly worse at nights. He had become more weak over the past few days where he needs more assistance to walk. His appetite had also decreased. Patient was then brought to the ED for further evaluation. Workup showed UTI and acute gouty flare. Pt was admitted by [facility] for further evaluation and treatment.    Hospital Course by Discharge Summary  1. Acute encephalopathy in setting of chronic dementia, resolved: At this point felt secondary to UTI +/- a component of dehydration and with pt's underlying dementia. Pt did undergo head CT on admission that ruled out acute, intracranial cause for patient's symptoms. His symptoms gradually improved with IVFs and empiric antibiotic treatment. He is currently at baseline in terms of mentation.   2. UTI: Pt treated with empiric Rocephin. He will receive his 5th and final dose prior to discharge to SNF. Unfortunately, it does not appear that a urine culture was obtained. However, pt has remained afebrile with stable vital signs throughout  hospitalization and with resolution of above issue.   3. Acute kidney injury (mild) in setting of CKD: related to uti and dehydration on admit. Resolved with gentle IVF hydration. Discharge creatinine is 1.51 which appears to be at baseline.   4. Acute gout flare, left knee and right 3rd finger: Patient has a long-standing history of gout, family claims that current knee is similar to his prior gout flares. Patient was on when necessary tapering prednisone the last admission and this was discontinued because of compression fracture. He was again started on prednisone taper that will be completed on 3/5. Pt's symptoms have greatly improved with decreased swelling and pain. Continue allopurinol at discharge.  5. FTT: Pt and family are agreeable to discharge to skilled nursing facility at this time.   6. Urinary Urgency: likely related to BPH. Pt is being treated for UTI. He reports on day of discharge that he feels he is not emptying his bladder. Will start Flomax and have pt followed-up by provider at [facility].   Consults: Non-medical consults include CSW, PT/OT   Significant Diagnostic Studies:  CT HEAD WITHOUT CONTRAST IMPRESSION:  No skull fracture or intracranial hemorrhage.  No CT evidence of large acute infarct.  Small vessel disease type changes.  Global atrophy.  Original Report Authenticated By: Fuller Canada, M.D.  PORTABLE CHEST - 1 VIEW IMPRESSION:  Enlargement of cardiac silhouette.  Right basilar subsegmental atelectasis.  Original Report Authenticated By: Lollie Marrow, M.D.   Discharge Exam: General: Awake, alert sitting in bedside chair in NAD CV: S1S2 RRR, no m/r/g, no LE edema Resp: CTAB, no w/r/c, no increased  wob GI: abdomen soft, proturbent, NT, BS+ Neuro: no focal m/s deficits on exam Psych: alert and oriented, at baseline. MSK: Gouty changes to left knee without any swelling or erythema   Discharge Labs  BMET  Lab 11/18/11 1312 11/16/11 0645 11/15/11  2138 11/15/11 1257  NA 139 138 -- 137  K 4.8 4.3 -- --  CL 106 104 -- 103  CO2 25 25 -- 24  GLUCOSE 194* 178* -- 226*  BUN 32* 23 -- 24*  CREATININE 1.51* 1.62* 1.62* 1.49*  CALCIUM 10.3 9.9 -- 9.7  MG -- -- -- --  PHOS -- -- -- --     CBC   Lab 11/16/11 0645 11/15/11 2138 11/15/11 1257  HGB 10.5* 10.0* 10.6*  HCT 32.7* 31.2* 33.5*  WBC 14.5* 15.9* 13.2*  PLT 262 242 235       Medication List  As of 11/19/2011 10:02 AM   START taking these medications         acetaminophen 325 MG tablet   Commonly known as: TYLENOL   Take 2 tablets (650 mg total) by mouth every 6 (six) hours as needed (or Fever >/= 101).      * LORazepam 0.5 MG tablet   Commonly known as: ATIVAN   Take 1 tablet (0.5 mg total) by mouth every 6 (six) hours as needed for anxiety.      polyethylene glycol packet   Commonly known as: MIRALAX / GLYCOLAX   Take 17 g by mouth daily.      predniSONE 20 MG tablet   Commonly known as: DELTASONE   Take 2 tablets (40 mg total) by mouth daily with breakfast.      Tamsulosin HCl 0.4 MG Caps   Commonly known as: FLOMAX   Take 1 capsule (0.4 mg total) by mouth daily.     * Notice: This list has 1 medication(s) that are the same as other medications prescribed for you. Read the directions carefully, and ask your doctor or other care provider to review them with you.       CHANGE how you take these medications         oxyCODONE-acetaminophen 5-325 MG per tablet   Commonly known as: PERCOCET   Take 2 tablets by mouth every 6 (six) hours as needed.   What changed: - dose - how often to take the med - doctor's instructions         CONTINUE taking these medications         allopurinol 300 MG tablet   Commonly known as: ZYLOPRIM      cetirizine 10 MG tablet   Commonly known as: ZYRTEC      docusate sodium 100 MG capsule   Commonly known as: COLACE      furosemide 40 MG tablet   Commonly known as: LASIX      glimepiride 2 MG tablet   Commonly  known as: AMARYL      insulin glargine 100 UNIT/ML injection   Commonly known as: LANTUS      lansoprazole 15 MG capsule   Commonly known as: PREVACID      * LORazepam 0.5 MG tablet   Commonly known as: ATIVAN      metoprolol 50 MG tablet   Commonly known as: LOPRESSOR      polysaccharide iron 150 MG Caps capsule   Commonly known as: NIFEREX      rivastigmine 4.6 mg/24hr   Commonly known as: EXELON  simvastatin 20 MG tablet   Commonly known as: ZOCOR     * Notice: This list has 1 medication(s) that are the same as other medications prescribed for you. Read the directions carefully, and ask your doctor or other care provider to review them with you.        Where to get your medications    These are the prescriptions that you need to pick up.   You may get these medications from any pharmacy.         acetaminophen 325 MG tablet   LORazepam 0.5 MG tablet   oxyCODONE-acetaminophen 5-325 MG per tablet         Information on where to get these meds is not yet available. Ask your nurse or doctor.         polyethylene glycol packet   predniSONE 20 MG tablet   Tamsulosin HCl 0.4 MG Caps              Disposition: Skilled nursing facilty  Discharged Condition: WELDON NOURI has met maximum benefit of inpatient care and is medically stable and cleared for discharge.  Patient is pending follow up as above.      Time spent on disposition:  Greater than 35 minutes.   Cordelia Pen, NP-C [facility]  pgr [phone]    Patient seen and examined   agree with discharge summary and plan outlined above

## 2012-05-01 ENCOUNTER — Encounter (HOSPITAL_COMMUNITY): Payer: Self-pay | Admitting: *Deleted

## 2012-05-01 ENCOUNTER — Inpatient Hospital Stay (HOSPITAL_COMMUNITY)
Admission: EM | Admit: 2012-05-01 | Discharge: 2012-05-05 | DRG: 310 | Disposition: A | Discharge status: Skilled Nursing Facility | Payer: Medicare Other | Attending: Internal Medicine | Admitting: Internal Medicine

## 2012-05-01 ENCOUNTER — Emergency Department (HOSPITAL_COMMUNITY): Payer: Medicare Other

## 2012-05-01 DIAGNOSIS — R4182 Altered mental status, unspecified: Secondary | ICD-10-CM

## 2012-05-01 DIAGNOSIS — I129 Hypertensive chronic kidney disease with stage 1 through stage 4 chronic kidney disease, or unspecified chronic kidney disease: Secondary | ICD-10-CM | POA: Diagnosis present

## 2012-05-01 DIAGNOSIS — I951 Orthostatic hypotension: Secondary | ICD-10-CM

## 2012-05-01 DIAGNOSIS — E785 Hyperlipidemia, unspecified: Secondary | ICD-10-CM

## 2012-05-01 DIAGNOSIS — Z8719 Personal history of other diseases of the digestive system: Secondary | ICD-10-CM

## 2012-05-01 DIAGNOSIS — I10 Essential (primary) hypertension: Secondary | ICD-10-CM | POA: Diagnosis present

## 2012-05-01 DIAGNOSIS — R001 Bradycardia, unspecified: Secondary | ICD-10-CM | POA: Diagnosis present

## 2012-05-01 DIAGNOSIS — D649 Anemia, unspecified: Secondary | ICD-10-CM | POA: Diagnosis present

## 2012-05-01 DIAGNOSIS — F0390 Unspecified dementia without behavioral disturbance: Secondary | ICD-10-CM | POA: Diagnosis present

## 2012-05-01 DIAGNOSIS — E860 Dehydration: Secondary | ICD-10-CM | POA: Diagnosis present

## 2012-05-01 DIAGNOSIS — R9431 Abnormal electrocardiogram [ECG] [EKG]: Secondary | ICD-10-CM

## 2012-05-01 DIAGNOSIS — N182 Chronic kidney disease, stage 2 (mild): Secondary | ICD-10-CM | POA: Diagnosis present

## 2012-05-01 DIAGNOSIS — I2510 Atherosclerotic heart disease of native coronary artery without angina pectoris: Secondary | ICD-10-CM | POA: Diagnosis present

## 2012-05-01 DIAGNOSIS — W19XXXA Unspecified fall, initial encounter: Secondary | ICD-10-CM

## 2012-05-01 DIAGNOSIS — R55 Syncope and collapse: Secondary | ICD-10-CM | POA: Diagnosis present

## 2012-05-01 DIAGNOSIS — N390 Urinary tract infection, site not specified: Secondary | ICD-10-CM

## 2012-05-01 DIAGNOSIS — R6251 Failure to thrive (child): Secondary | ICD-10-CM

## 2012-05-01 DIAGNOSIS — I498 Other specified cardiac arrhythmias: Principal | ICD-10-CM | POA: Diagnosis present

## 2012-05-01 DIAGNOSIS — F411 Generalized anxiety disorder: Secondary | ICD-10-CM

## 2012-05-01 DIAGNOSIS — E119 Type 2 diabetes mellitus without complications: Secondary | ICD-10-CM | POA: Diagnosis present

## 2012-05-01 DIAGNOSIS — M1990 Unspecified osteoarthritis, unspecified site: Secondary | ICD-10-CM

## 2012-05-01 DIAGNOSIS — Z8546 Personal history of malignant neoplasm of prostate: Secondary | ICD-10-CM

## 2012-05-01 DIAGNOSIS — M109 Gout, unspecified: Secondary | ICD-10-CM | POA: Diagnosis present

## 2012-05-01 DIAGNOSIS — S32009A Unspecified fracture of unspecified lumbar vertebra, initial encounter for closed fracture: Secondary | ICD-10-CM

## 2012-05-01 DIAGNOSIS — I252 Old myocardial infarction: Secondary | ICD-10-CM

## 2012-05-01 DIAGNOSIS — I499 Cardiac arrhythmia, unspecified: Secondary | ICD-10-CM

## 2012-05-01 DIAGNOSIS — K219 Gastro-esophageal reflux disease without esophagitis: Secondary | ICD-10-CM

## 2012-05-01 HISTORY — DX: Type 2 diabetes mellitus without complications: E11.9

## 2012-05-01 HISTORY — DX: Cardiac arrhythmia, unspecified: I49.9

## 2012-05-01 HISTORY — DX: Gastro-esophageal reflux disease without esophagitis: K21.9

## 2012-05-01 LAB — CREATININE, SERUM: GFR calc Af Amer: 67 mL/min — ABNORMAL LOW (ref >90)

## 2012-05-01 LAB — GLUCOSE, CAPILLARY
Glucose-Capillary: 170 mg/dL — ABNORMAL HIGH (ref 70–99)
Glucose-Capillary: 224 mg/dL — ABNORMAL HIGH (ref 70–99)

## 2012-05-01 LAB — CBC
HCT: 33.3 % — ABNORMAL LOW (ref 39.0–52.0)
HCT: 33.5 % — ABNORMAL LOW (ref 39.0–52.0)
Hemoglobin: 10.1 g/dL — ABNORMAL LOW (ref 13.0–17.0)
MCH: 26.6 pg (ref 26.0–34.0)
MCH: 27 pg (ref 26.0–34.0)
MCHC: 30.3 g/dL (ref 30.0–36.0)
MCV: 87 fL (ref 78.0–100.0)
RBC: 3.79 MIL/uL — ABNORMAL LOW (ref 4.22–5.81)
RBC: 3.85 MIL/uL — ABNORMAL LOW (ref 4.22–5.81)
WBC: 7.6 10*3/uL (ref 4.0–10.5)

## 2012-05-01 LAB — URINE MICROSCOPIC-ADD ON

## 2012-05-01 LAB — CARDIAC PANEL(CRET KIN+CKTOT+MB+TROPI)
CK, MB: 2.2 ng/mL (ref 0.3–4.0)
Relative Index: INVALID (ref 0.0–2.5)
Relative Index: INVALID (ref 0.0–2.5)
Total CK: 37 U/L (ref 7–232)
Troponin I: <0.3 ng/mL (ref <0.30)
Troponin I: <0.3 ng/mL (ref <0.30)

## 2012-05-01 LAB — CK TOTAL AND CKMB (NOT AT ARMC): Total CK: 30 U/L (ref 7–232)

## 2012-05-01 LAB — URINALYSIS, ROUTINE W REFLEX MICROSCOPIC
Glucose, UA: NEGATIVE mg/dL
Hgb urine dipstick: NEGATIVE
Leukocytes, UA: NEGATIVE
Protein, ur: 100 mg/dL — AB
Specific Gravity, Urine: 1.017 (ref 1.005–1.030)
pH: 5.5 (ref 5.0–8.0)

## 2012-05-01 LAB — HEMOGLOBIN A1C
Hgb A1c MFr Bld: 7.9 % — ABNORMAL HIGH (ref <5.7)
Mean Plasma Glucose: 180 mg/dL — ABNORMAL HIGH (ref <117)

## 2012-05-01 LAB — COMPREHENSIVE METABOLIC PANEL
Alkaline Phosphatase: 104 U/L (ref 39–117)
BUN: 16 mg/dL (ref 6–23)
CO2: 22 mEq/L (ref 19–32)
GFR calc Af Amer: 60 mL/min — ABNORMAL LOW (ref >90)
GFR calc non Af Amer: 52 mL/min — ABNORMAL LOW (ref >90)
Glucose, Bld: 148 mg/dL — ABNORMAL HIGH (ref 70–99)
Potassium: 3.9 mEq/L (ref 3.5–5.1)
Total Protein: 7.5 g/dL (ref 6.0–8.3)

## 2012-05-01 LAB — PROTIME-INR: Prothrombin Time: 14.7 seconds (ref 11.6–15.2)

## 2012-05-01 LAB — MAGNESIUM: Magnesium: 1.8 mg/dL (ref 1.5–2.5)

## 2012-05-01 MED ORDER — ALLOPURINOL 300 MG PO TABS
300.0000 mg | ORAL_TABLET | Freq: Every day | ORAL | Status: DC
  Administered 2012-05-01 – 2012-05-05 (×5): 300 mg via ORAL
  Filled 2012-05-01 (×5): qty 1

## 2012-05-01 MED ORDER — SERTRALINE HCL 50 MG PO TABS
50.0000 mg | ORAL_TABLET | Freq: Every day | ORAL | Status: DC
  Administered 2012-05-02 – 2012-05-05 (×4): 50 mg via ORAL
  Filled 2012-05-01 (×5): qty 1

## 2012-05-01 MED ORDER — HYDROCODONE-ACETAMINOPHEN 5-325 MG PO TABS
1.0000 | ORAL_TABLET | ORAL | Status: DC | PRN
  Administered 2012-05-03: 1 via ORAL
  Filled 2012-05-01: qty 1

## 2012-05-01 MED ORDER — ONDANSETRON HCL 4 MG PO TABS: 4.0000 mg | ORAL_TABLET | Freq: Four times a day (QID) | ORAL | Status: DC | PRN

## 2012-05-01 MED ORDER — INSULIN GLARGINE 100 UNIT/ML ~~LOC~~ SOLN: 15.0000 [IU] | Freq: Every day | SUBCUTANEOUS | Status: DC

## 2012-05-01 MED ORDER — ACETAMINOPHEN 650 MG RE SUPP: 650.0000 mg | Freq: Four times a day (QID) | RECTAL | Status: DC | PRN

## 2012-05-01 MED ORDER — ASPIRIN EC 81 MG PO TBEC
81.0000 mg | DELAYED_RELEASE_TABLET | Freq: Every day | ORAL | Status: DC
  Administered 2012-05-02 – 2012-05-05 (×4): 81 mg via ORAL
  Filled 2012-05-01 (×5): qty 1

## 2012-05-01 MED ORDER — POLYVINYL ALCOHOL 1.4 % OP SOLN
1.0000 [drp] | OPHTHALMIC | Status: DC | PRN
Start: 2012-05-01 — End: 2012-05-05

## 2012-05-01 MED ORDER — ACETAMINOPHEN 325 MG PO TABS: 650.0000 mg | ORAL_TABLET | Freq: Four times a day (QID) | ORAL | Status: DC | PRN

## 2012-05-01 MED ORDER — MIRTAZAPINE 15 MG PO TABS
15.0000 mg | ORAL_TABLET | Freq: Every day | ORAL | Status: DC
  Administered 2012-05-01 – 2012-05-04 (×4): 15 mg via ORAL
  Filled 2012-05-01 (×5): qty 1

## 2012-05-01 MED ORDER — SODIUM CHLORIDE 0.9 % IJ SOLN
3.0000 mL | Freq: Two times a day (BID) | INTRAMUSCULAR | Status: DC
  Administered 2012-05-01 – 2012-05-05 (×7): 3 mL via INTRAVENOUS

## 2012-05-01 MED ORDER — HEPARIN SODIUM (PORCINE) 5000 UNIT/ML IJ SOLN
5000.0000 [IU] | Freq: Three times a day (TID) | INTRAMUSCULAR | Status: DC
  Administered 2012-05-01 – 2012-05-04 (×10): 5000 [IU] via SUBCUTANEOUS
  Filled 2012-05-01 (×16): qty 1

## 2012-05-01 MED ORDER — LORAZEPAM 0.5 MG PO TABS: 0.5000 mg | ORAL_TABLET | Freq: Four times a day (QID) | ORAL | Status: DC | PRN

## 2012-05-01 MED ORDER — GLIMEPIRIDE 2 MG PO TABS
2.0000 mg | ORAL_TABLET | Freq: Once | ORAL | Status: AC
  Administered 2012-05-01: 2 mg via ORAL
  Filled 2012-05-01: qty 1

## 2012-05-01 MED ORDER — FUROSEMIDE 40 MG PO TABS
40.0000 mg | ORAL_TABLET | Freq: Every day | ORAL | Status: DC
  Administered 2012-05-01 – 2012-05-05 (×5): 40 mg via ORAL
  Filled 2012-05-01 (×5): qty 1

## 2012-05-01 MED ORDER — INSULIN ASPART 100 UNIT/ML ~~LOC~~ SOLN
0.0000 [IU] | Freq: Three times a day (TID) | SUBCUTANEOUS | Status: DC
  Administered 2012-05-01 – 2012-05-02 (×2): 2 [IU] via SUBCUTANEOUS
  Administered 2012-05-03: 1 [IU] via SUBCUTANEOUS
  Administered 2012-05-04 (×3): 9 [IU] via SUBCUTANEOUS
  Administered 2012-05-05: 5 [IU] via SUBCUTANEOUS
  Administered 2012-05-05: 2 [IU] via SUBCUTANEOUS
  Filled 2012-05-01: qty 1

## 2012-05-01 MED ORDER — DOCUSATE SODIUM 100 MG PO CAPS
100.0000 mg | ORAL_CAPSULE | Freq: Two times a day (BID) | ORAL | Status: DC
  Administered 2012-05-01 – 2012-05-05 (×9): 100 mg via ORAL
  Filled 2012-05-01 (×10): qty 1

## 2012-05-01 MED ORDER — SODIUM CHLORIDE 0.9 % IV SOLN: INTRAVENOUS | Status: DC

## 2012-05-01 MED ORDER — SODIUM CHLORIDE 0.9 % IV BOLUS (SEPSIS): 250.0000 mL | Freq: Once | INTRAVENOUS | Status: DC

## 2012-05-01 MED ORDER — NITROFURANTOIN MACROCRYSTAL 100 MG PO CAPS
100.0000 mg | ORAL_CAPSULE | Freq: Every day | ORAL | Status: DC
  Administered 2012-05-02 – 2012-05-05 (×4): 100 mg via ORAL
  Filled 2012-05-01 (×5): qty 1

## 2012-05-01 MED ORDER — LORATADINE 10 MG PO TABS
10.0000 mg | ORAL_TABLET | Freq: Every day | ORAL | Status: DC
  Administered 2012-05-01 – 2012-05-05 (×5): 10 mg via ORAL
  Filled 2012-05-01 (×5): qty 1

## 2012-05-01 MED ORDER — METOPROLOL TARTRATE 12.5 MG HALF TABLET
12.5000 mg | ORAL_TABLET | Freq: Two times a day (BID) | ORAL | Status: DC
  Administered 2012-05-01 – 2012-05-05 (×9): 12.5 mg via ORAL
  Filled 2012-05-01 (×10): qty 1

## 2012-05-01 MED ORDER — GLIMEPIRIDE 2 MG PO TABS
2.0000 mg | ORAL_TABLET | Freq: Every day | ORAL | Status: DC
Start: 2012-05-02 — End: 2012-05-05
  Administered 2012-05-02 – 2012-05-05 (×4): 2 mg via ORAL
  Filled 2012-05-01 (×5): qty 1

## 2012-05-01 MED ORDER — TAMSULOSIN HCL 0.4 MG PO CAPS
0.4000 mg | ORAL_CAPSULE | Freq: Every day | ORAL | Status: DC
  Administered 2012-05-01 – 2012-05-05 (×5): 0.4 mg via ORAL
  Filled 2012-05-01 (×5): qty 1

## 2012-05-01 MED ORDER — INSULIN GLARGINE 100 UNIT/ML ~~LOC~~ SOLN
15.0000 [IU] | Freq: Every day | SUBCUTANEOUS | Status: DC
  Administered 2012-05-01: 15 [IU] via SUBCUTANEOUS

## 2012-05-01 MED ORDER — ONDANSETRON HCL 4 MG/2ML IJ SOLN: 4.0000 mg | Freq: Four times a day (QID) | INTRAMUSCULAR | Status: DC | PRN

## 2012-05-01 NOTE — Progress Notes (Signed)
Patient admitted earlier today for a near syncopal episode. He was noted to be orthostatic and appeared clinically dehydrated. Is also demented and this has probably lead to decreased PO intake. Agree with IVF and decreased BB dose. Will recheck orthostatic VS in the morning. Will need to discuss code status and discharge planning with family as he will likely be ready for DC home in 1-2 days. Have left a voicemail for daughter [name] to call us back. Will continue to follow. Carotid dopplers ok, ECHO pending.  Peggye Pitt, MD [facility] Pager: [phone]

## 2012-05-01 NOTE — Progress Notes (Signed)
VASCULAR LAB PRELIMINARY  PRELIMINARY  PRELIMINARY  PRELIMINARY  Carotid duplex completed.    Preliminary report:  Bilateral:  No evidence of hemodynamically significant internal carotid artery stenosis.   Vertebral artery flow is antegrade.     [name], RVS 05/01/2012, 10:33 AM

## 2012-05-01 NOTE — ED Notes (Signed)
Daughter [name] [phone]

## 2012-05-01 NOTE — ED Provider Notes (Addendum)
History     CSN: [id number]  Arrival date & time 05/01/12  0244   First MD Initiated Contact with Patient 05/01/12 [phone]      Chief Complaint  Patient presents with  . Loss of Consciousness    (Consider location/radiation/quality/duration/timing/severity/associated sxs/prior treatment) Patient is a 78 y.o. male presenting with syncope. The history is provided by the patient.  Loss of Consciousness This is a new problem. The current episode started less than 1 hour ago. The problem occurs constantly. The problem has been resolved. Pertinent negatives include no chest pain, no abdominal pain and no shortness of breath. Nothing aggravates the symptoms. Nothing relieves the symptoms.    Past Medical History  Diagnosis Date  . Dementia   . Diabetes mellitus   . Hypertension   . Gout   . Hyperlipidemia   . Fracture of lumbar spine 11-08-11  . Coronary artery disease   . Myocardial infarction 2004    NSTEMI/E-chart  . Chronic kidney disease (CKD), stage II (mild)     /E-chart  . Prostate cancer     S/P radiation  . Osteoarthritis     /E-chart  . GI bleeding 2004; 01/2011    /E-chart  . Blood transfusion   . Anemia   . Diverticulosis     Past Surgical History  Procedure Date  . Left leg surgery 1950's    "injured in [place] war"  . Right shoulder surgery   . Left cataract extraction     History reviewed. No pertinent family history.  History  Substance Use Topics  . Smoking status: Former Smoker -- 1.0 packs/day    Types: Cigarettes  . Smokeless tobacco: Never Used   Comment: 11/16/11 "quit smoking 20-30 yr ago"  . Alcohol Use: No      Review of Systems  Respiratory: Negative for shortness of breath.   Cardiovascular: Positive for syncope. Negative for chest pain.  Gastrointestinal: Negative for abdominal pain.  Neurological: Positive for syncope.  All other systems reviewed and are negative.    Allergies  Aspirin and Ibuprofen  Home Medications    Current Outpatient Rx  Name Route Sig Dispense Refill  . ACETAMINOPHEN 325 MG PO TABS Oral Take 2 tablets (650 mg total) by mouth every 6 (six) hours as needed (or Fever >/= 101).    . ALLOPURINOL 300 MG PO TABS Oral Take 300 mg by mouth daily.    Marland Kitchen CETIRIZINE HCL 10 MG PO TABS Oral Take 10 mg by mouth every evening.    Marland Kitchen DOCUSATE SODIUM 100 MG PO CAPS Oral Take 100 mg by mouth 2 (two) times daily.    Marland Kitchen GLIMEPIRIDE 2 MG PO TABS Oral Take 2 mg by mouth daily before breakfast.    . INSULIN GLARGINE 100 UNIT/ML [place] SOLN Subcutaneous Inject 15 Units into the skin at bedtime.    Marland Kitchen LORAZEPAM 0.5 MG PO TABS Oral Take 0.5 mg by mouth every 6 (six) hours as needed. Anxiety    . METOPROLOL TARTRATE 50 MG PO TABS Oral Take 50 mg by mouth 2 (two) times daily.    Marland Kitchen SIMVASTATIN 20 MG PO TABS Oral Take 20 mg by mouth at bedtime.    . TAMSULOSIN HCL 0.4 MG PO CAPS Oral Take 1 capsule (0.4 mg total) by mouth daily. 30 capsule     BP 126/62  Pulse 49  Temp 97.4 F (36.3 C) (Oral)  Resp 11  SpO2 100%  Physical Exam  Constitutional: He appears well-nourished. He appears  cachectic.  HENT:  Head: Normocephalic and atraumatic.  Eyes: Conjunctivae are normal. Pupils are equal, round, and reactive to light.  Neck: Normal range of motion. Neck supple.  Cardiovascular: Normal rate, regular rhythm, normal heart sounds and intact distal pulses.   Pulmonary/Chest: Effort normal and breath sounds normal.  Abdominal: Soft. Bowel sounds are normal.  Neurological: He is alert. He is disoriented.       Oriented to person, place.  At baseline per family  Skin: Skin is warm and dry.  Psychiatric: He has a normal mood and affect. His behavior is normal. Judgment and thought content normal.    ED Course  Procedures (including critical care time)   Labs Reviewed  CBC  COMPREHENSIVE METABOLIC PANEL  PROTIME-INR  TROPONIN I  CK TOTAL AND CKMB  URINALYSIS, ROUTINE W REFLEX MICROSCOPIC   No results  found.   No diagnosis found.   Date: 05/01/2012  Rate: 54  Rhythm: sinus bradycardia  QRS Axis: normal  Intervals: normal  ST/T Wave abnormalities: nonspecific ST changes  Conduction Disutrbances:none  Narrative Interpretation:   Old EKG Reviewed: unchanged   MDM  Pt with syncopal episode.  Mildly improved.  Discussed with hospitalist will admit.        Klara Stjames Lytle Michaels, MD 05/01/12 4540  Rosanne Ashing, MD 05/01/12 [phone]

## 2012-05-01 NOTE — ED Notes (Signed)
Pt has finished eating breakfast, report has been called by night shift RN. Pt given insulin coverage. 2 units given for CBG 170. Floor called to notify taking patient upstairs.

## 2012-05-01 NOTE — ED Notes (Signed)
Pt arrived from home via [facility] with c/o syncopal episode 30 seconds to a minute witnessed by daughter. Symptom resolved. Pt denies pain and states that currently he only feels weak. EMS VS 112/60, CBG 129, and HR 50. EMS started a 18 ga IV and administered 200 ml of fluid due to first BP on scene being 88/p. Pt hx: Dementia, HTN, CHF, DM, failure to thrive

## 2012-05-01 NOTE — H&P (Signed)
[facility]                                                                                    Patient Demographics  [name], is a 78 y.o. male  CSN: [id number]  MRN: [id number]  DOB - 06-06-1926  Admit Date - 05/01/2012  Outpatient Primary MD for the patient is Emeterio Reeve, MD   With History of -  Past Medical History  Diagnosis Date  . Dementia   . Diabetes mellitus   . Hypertension   . Gout   . Hyperlipidemia   . Fracture of lumbar spine 11-08-11  . Coronary artery disease   . Myocardial infarction 2004    NSTEMI/E-chart  . Chronic kidney disease (CKD), stage II (mild)     /E-chart  . Prostate cancer     S/P radiation  . Osteoarthritis     /E-chart  . GI bleeding 2004; 01/2011    /E-chart  . Blood transfusion   . Anemia   . Diverticulosis       Past Surgical History  Procedure Date  . Left leg surgery 1950's    "injured in [place] war"  . Right shoulder surgery   . Left cataract extraction     in for   Chief Complaint  Patient presents with  . Loss of Consciousness     HPI  [name]  is a 78 y.o. male, with past medical history significant for coronary artery disease chronic kidney disease prostate cancer and others, presenting today with a presyncopal episode that occurred while he was trying to get up from the couch. Patient denies any chest pains shortness of breath, palpitations but reports nausea and cold sweats. no complete loss of consciousness. Patient has no history of seizures. He had the same episode 2 days ago again when trying to get up from chair. No report of shortness of breath burning on urination or headache.    Review of Systems    In addition to the HPI above,  No Fever-chills, No Headache, No changes with Vision or hearing, No problems swallowing food or Liquids, No Chest pain, Cough or Shortness of Breath, No Abdominal pain, No Nausea or Vommitting, Bowel movements are regular, No Blood in stool  or Urine, No dysuria, No new skin rashes or bruises, No new joints pains-aches,  No new weakness, tingling, numbness in any extremity, No recent weight gain or loss, No polyuria, polydypsia or polyphagia, No significant Mental Stressors.  A full 10 point Review of Systems was done, except as stated above, all other Review of Systems were negative.   Social History History  Substance Use Topics  . Smoking status: Former Smoker -- 1.0 packs/day    Types: Cigarettes  . Smokeless tobacco: Never Used   Comment: 11/16/11 "quit smoking 20-30 yr ago"  . Alcohol Use: No    Family History Patient does not recall, asked and reviewed. Prior to Admission medications   Medication Sig Start Date End Date Taking? Authorizing Provider  acetaminophen (TYLENOL) 325 MG tablet Take 2 tablets (650 mg total) by mouth every 6 (six) hours as needed (or Fever >/= 101). 11/19/11 11/18/12 Yes  Army Chaco, NP  allopurinol (ZYLOPRIM) 300 MG tablet Take 300 mg by mouth daily.   Yes Historical Provider, MD  cetirizine (ZYRTEC) 10 MG tablet Take 10 mg by mouth daily as needed.    Yes Historical Provider, MD  docusate sodium (COLACE) 100 MG capsule Take 100 mg by mouth 2 (two) times daily.   Yes Historical Provider, MD  furosemide (LASIX) 80 MG tablet Take 40 mg by mouth daily.   Yes Historical Provider, MD  glimepiride (AMARYL) 2 MG tablet Take 2 mg by mouth daily before breakfast.   Yes Historical Provider, MD  insulin glargine (LANTUS) 100 UNIT/ML injection Inject 15 Units into the skin at bedtime.   Yes Historical Provider, MD  LORazepam (ATIVAN) 0.5 MG tablet Take 0.5 mg by mouth every 6 (six) hours as needed. Anxiety   Yes Historical Provider, MD  metoprolol (LOPRESSOR) 50 MG tablet Take 50 mg by mouth 2 (two) times daily.   Yes Historical Provider, MD  mirtazapine (REMERON) 15 MG tablet Take 15 mg by mouth at bedtime.   Yes Historical Provider, MD  nitrofurantoin (MACRODANTIN) 100 MG capsule Take 100 mg by  mouth daily before breakfast.   Yes Historical Provider, MD  polyvinyl alcohol (LIQUIFILM TEARS) 1.4 % ophthalmic solution Place 1 drop into both eyes as needed. Dry eyes   Yes Historical Provider, MD  simvastatin (ZOCOR) 20 MG tablet Take 20 mg by mouth at bedtime.   Yes Historical Provider, MD  Tamsulosin HCl (FLOMAX) 0.4 MG CAPS Take 1 capsule (0.4 mg total) by mouth daily. 11/19/11  Yes Army Chaco, NP  sertraline (ZOLOFT) 50 MG tablet Take 50 mg by mouth daily.    Historical Provider, MD    Allergies  Allergen Reactions  . Aspirin Other (See Comments)    Unknown. Pt does not remember  . Ibuprofen Other (See Comments)    Unknown. Pt does not remember    Physical Exam  Vitals  Blood pressure 126/62, pulse 49, temperature 97.4 F (36.3 C), temperature source Oral, resp. rate 11, SpO2 100.00%.   1. General elderly African American male lying comfortably in bed very pleasant.  2. Normal affect and insight, Not Suicidal or Homicidal, Awake Alert, confused  3. No F.N deficits, ALL C.Nerves Intact, Strength 5/5 all 4 extremities, Sensation intact all 4 extremities, Plantars down going.  4. Ears and Eyes appear Normal, Conjunctivae clear, PERRLA. Moist Oral Mucosa.  5. Supple Neck, No JVD, No cervical lymphadenopathy appriciated, No Carotid Bruits.  6. Symmetrical Chest wall movement, Good air movement bilaterally, CTAB.  7. RRR, No Gallops, Rubs or Murmurs, No Parasternal Heave.  8. Positive Bowel Sounds, Abdomen Soft, Non tender, No organomegaly appriciated,No rebound -guarding or rigidity.  9.  No Cyanosis, Normal Skin Turgor, No Skin Rash or Bruise.  10. Good muscle tone,  joints appear normal , no effusions, Normal ROM.  11. No Palpable Lymph Nodes in Neck or Axillae .  Data Review  CBC  Lab 05/01/12 0356  WBC 8.8  HGB 10.1*  HCT 33.3*  PLT 380  MCV 87.9  MCH 26.6  MCHC 30.3  RDW 15.5  LYMPHSABS --  MONOABS --  EOSABS --  BASOSABS --  BANDABS --    ------------------------------------------------------------------------------------------------------------------  Chemistries   Lab 05/01/12 0356  NA 140  K 3.9  CL 105  CO2 22  GLUCOSE 148*  BUN 16  CREATININE 1.23  CALCIUM 10.1  MG --  AST 12  ALT 7  ALKPHOS 104  BILITOT 0.3   ------------------------------------------------------------------------------------------------------------------   Coagulation profile  Lab 05/01/12 0356  INR 1.13  PROTIME --   ------------------------------------------------------------------------------------------------------------------- -------------------------------------------------------------------------------------------------------------------  Cardiac Enzymes  Lab 05/01/12 0356  CKMB 1.4  TROPONINI <0.30  MYOGLOBIN --   ------------------------------------------------------------------------------------------------------------------ No components found with this basename: POCBNP:3   ---------------------------------------------------------------------------------------------------------------  Urinalysis    Component Value Date/Time   COLORURINE YELLOW 05/01/2012 0544   APPEARANCEUR CLEAR 05/01/2012 0544   LABSPEC 1.017 05/01/2012 0544   PHURINE 5.5 05/01/2012 0544   GLUCOSEU NEGATIVE 05/01/2012 0544   HGBUR NEGATIVE 05/01/2012 0544   BILIRUBINUR SMALL* 05/01/2012 0544   KETONESUR NEGATIVE 05/01/2012 0544   PROTEINUR 100* 05/01/2012 0544   UROBILINOGEN 0.2 05/01/2012 0544   NITRITE NEGATIVE 05/01/2012 0544   LEUKOCYTESUR NEGATIVE 05/01/2012 0544    ----------------------------------------------------------------------------------------------------------------     Imaging results:   Ct Head Wo Contrast  05/01/2012  *RADIOLOGY REPORT*  Clinical Data: Syncopal episode.  CT HEAD WITHOUT CONTRAST  Technique:  Contiguous axial images were obtained from the base of the skull through the vertex without contrast.   Comparison: 11/15/2011 head CT.  Findings: No mass lesion, mass effect, midline shift, hydrocephalus, hemorrhage.  No acute territorial cortical ischemia/infarct. Atrophy and chronic ischemic white matter disease is present.  Intracranial atherosclerosis.  Mastoid air cells and paranasal sinuses are within normal limits.  Calvarium intact.  IMPRESSION: Atrophy and chronic ischemic white matter disease without acute intracranial abnormality.  Original Report Authenticated By: Andreas Newport, M.D.    My personal review of EKG:    Assessment & Plan  1. syncopal episode; his first troponin is negative. Will check echo and carotid Dopplers. neurochecks . The patient was orthostatic. His beta blockers will be decreased and IV fluids will be started  2. Diabetes mellitus; will check hemoglobin A1c and we'll continue his insulin  3. Mild anemia. May need to be worked up as outpatient.  4. Bradycardia: Beta blockers decreased .  5. Coronary artery disease status post MI in the past .  6. Chronic kidney disease stage II stable   DVT Prophylaxis Heparin  AM Labs Ordered, also please review Full Orders  Family Communication: Admission, patients condition and plan of care including tests being ordered have been discussed with the patient  who indicate understanding and agree with the plan and Code Status.  Code Status full Disposition Plan: Home with his daughter Time spent in minutes : 35  Condition GUARDED

## 2012-05-01 NOTE — ED Notes (Signed)
Pt states he has not had much of appetite, and barley touching his food

## 2012-05-01 NOTE — Care Management Note (Signed)
    Page 1 of 1   05/05/2012     10:22:37 AM   CARE MANAGEMENT NOTE 05/05/2012  Patient:  JARRED, PURTEE   Account Number:  [id number]  Date Initiated:  05/01/2012  Documentation initiated by:  GRAVES-BIGELOW,Kiondra Caicedo  Subjective/Objective Assessment:   Pt admitted with syncope. On arrival pt had low bp and fluids initiated. Pt has family support.     Action/Plan:   CM will continue to monitor for disposition needs.   Anticipated DC Date:  05/04/2012   Anticipated DC Plan:  HOME W HOME HEALTH SERVICES      DC Planning Services  CM consult      Choice offered to / List presented to:             Status of service:  Completed, signed off Medicare Important Message given?   (If response is "NO", the following Medicare IM given date fields will be blank) Date Medicare IM given:   Date Additional Medicare IM given:    Discharge Disposition:  SKILLED NURSING FACILITY  Per UR Regulation:  Reviewed for med. necessity/level of care/duration of stay  If discussed at Long Length of Stay Meetings, dates discussed:    Comments:  05-05-12 1021 Tomi [address]amber[place], [place] [phone] Pt planned for d/c today to SN[facility]. CSW working with pt for diposition needs.

## 2012-05-01 NOTE — Progress Notes (Signed)
UR Completed Rosalynn Sergent Graves-Bigelow, RN,BSN [phone]  

## 2012-05-02 DIAGNOSIS — E860 Dehydration: Secondary | ICD-10-CM

## 2012-05-02 DIAGNOSIS — I951 Orthostatic hypotension: Secondary | ICD-10-CM

## 2012-05-02 LAB — GLUCOSE, CAPILLARY
Glucose-Capillary: 97 mg/dL (ref 70–99)
Glucose-Capillary: 155 mg/dL — ABNORMAL HIGH (ref 70–99)

## 2012-05-02 LAB — CBC
HCT: 28.4 % — ABNORMAL LOW (ref 39.0–52.0)
MCH: 26.7 pg (ref 26.0–34.0)
MCV: 86.3 fL (ref 78.0–100.0)
RDW: 15.3 % (ref 11.5–15.5)
WBC: 7.3 10*3/uL (ref 4.0–10.5)

## 2012-05-02 LAB — COMPREHENSIVE METABOLIC PANEL
ALT: 7 U/L (ref 0–53)
AST: 13 U/L (ref 0–37)
Albumin: 2.7 g/dL — ABNORMAL LOW (ref 3.5–5.2)
Alkaline Phosphatase: 86 U/L (ref 39–117)
BUN: 17 mg/dL (ref 6–23)
Potassium: 3.6 mEq/L (ref 3.5–5.1)
Sodium: 139 mEq/L (ref 135–145)
Total Protein: 6.5 g/dL (ref 6.0–8.3)

## 2012-05-02 LAB — CARDIAC PANEL(CRET KIN+CKTOT+MB+TROPI): Relative Index: INVALID (ref 0.0–2.5)

## 2012-05-02 MED ORDER — INSULIN GLARGINE 100 UNIT/ML ~~LOC~~ SOLN
10.0000 [IU] | Freq: Once | SUBCUTANEOUS | Status: AC
  Administered 2012-05-02: 10 [IU] via SUBCUTANEOUS

## 2012-05-02 NOTE — Progress Notes (Signed)
CSW spoke with pt dtr regarding pt dc plans. Per discussion with alf, pt is a candidate for care, however pt finances are a concern. CSW explained to patient that ALF is private pay unless patient has medicaid. Pt daughter plans to call ALF regarding financial questions. Pt and CSW also discussed skilled nursing for rehab while patient daughter arranges medicaid or other long term care for patient. Pt daughter agreed. CSW initiated snf search. Pt daughter prefers pt to [facility]. Please see progress note for status of placement.   Catha Gosselin, Theresia Majors  [phone] .05/02/2012  1651pm

## 2012-05-02 NOTE — Evaluation (Signed)
Physical Therapy Evaluation Patient Details Name: [name] MRN: [id number] DOB: 1926-05-29 Today's Date: 05/02/2012 Time: 1425-1440 PT Time Calculation (min): 15 min  PT Assessment / Plan / Recommendation Clinical Impression  Pt adm with syncope.  Pt with several hospital admissions over last 6 months and family interested in ALF.  Feel pt can go to ALF if facility able to provide minimal assist for pt mobility.  Recommend PT there.    PT Assessment  Patient needs continued PT services    Follow Up Recommendations  Home health PT (at ALF if ALF can provide needed assist.)    Barriers to Discharge        Equipment Recommendations  None recommended by PT    Recommendations for Other Services     Frequency Min 3X/week    Precautions / Restrictions Precautions Precautions: Fall   Pertinent Vitals/Pain N/A      Mobility  Bed Mobility Bed Mobility: Sit to Supine Sit to Supine: 6: Modified independent (Device/Increase time) Transfers Transfers: Sit to Stand;Stand to Sit Sit to Stand: 4: Min guard;With upper extremity assist;From bed Stand to Sit: 5: Supervision;With upper extremity assist;To bed Details for Transfer Assistance: Incr time to achieve standing Ambulation/Gait Ambulation/Gait Assistance: 4: Min assist Ambulation Distance (Feet): 150 Feet Assistive device: Rolling walker Ambulation/Gait Assistance Details: verbal cues to stay closer to walker and stand more erect. Gait Pattern: Narrow base of support;Right flexed knee in stance;Left flexed knee in stance;Trunk flexed    Exercises     PT Diagnosis: Difficulty walking;Generalized weakness  PT Problem List: Decreased strength;Decreased knowledge of use of DME;Decreased balance;Decreased mobility PT Treatment Interventions: DME instruction;Gait training;Patient/family education;Functional mobility training;Therapeutic activities;Therapeutic exercise;Balance training   PT Goals Acute Rehab PT Goals PT Goal  Formulation: With patient Time For Goal Achievement: 05/16/12 Potential to Achieve Goals: Good Pt will go Sit to Stand: with modified independence PT Goal: Sit to Stand - Progress: Goal set today Pt will go Stand to Sit: with modified independence PT Goal: Stand to Sit - Progress: Goal set today Pt will Ambulate: 51 - 150 feet;with supervision;with least restrictive assistive device PT Goal: Ambulate - Progress: Goal set today  Visit Information  Last PT Received On: 05/02/12 Assistance Needed: +1    Subjective Data  Subjective: Pt reports that I look familiar to him.   Patient Stated Goal: Return home   Prior Functioning  Home Living Lives With: Daughter (Family is looking at ALF placement.) Home Adaptive Equipment: Bedside commode/3-in-1;Walker - rolling;Shower chair with back Prior Function Level of Independence:  (amb with cane per pt.) Driving: No Vocation: Retired Musician: Clinical cytogeneticist  Overall Cognitive Status: History of cognitive impairments - at baseline Arousal/Alertness: Awake/alert Orientation Level: Disoriented to;Time;Place Behavior During Session: WFL for tasks performed    Extremity/Trunk Assessment Right Lower Extremity Assessment RLE ROM/Strength/Tone: Deficits RLE ROM/Strength/Tone Deficits: grossly 4/5 Left Lower Extremity Assessment LLE ROM/Strength/Tone: Deficits LLE ROM/Strength/Tone Deficits: grossly 4/5   Balance Static Standing Balance Static Standing - Balance Support: During functional activity;No upper extremity supported Static Standing - Level of Assistance: 5: Stand by assistance  End of Session PT - End of Session Equipment Utilized During Treatment: Gait belt Activity Tolerance: Patient tolerated treatment well Patient left: in bed;with call bell/phone within reach;with bed alarm set Nurse Communication: Mobility status  GP     Hedy Garro 05/02/2012, 3:25 PM  [facility] PT [phone]

## 2012-05-02 NOTE — Progress Notes (Signed)
Subjective: No complaints.  Objective: Vital signs in last 24 hours: Temp:  [97.7 F (36.5 C)-98.7 F (37.1 C)] 97.9 F (36.6 C) (08/16 1200) Pulse Rate:  [60-73] 73  (08/16 1200) Resp:  [12-20] 20  (08/16 1200) BP: (132-168)/(68-90) 168/90 mmHg (08/16 1200) SpO2:  [97 %-100 %] 99 % (08/16 1200) Weight change:  Last BM Date: 05/01/12  Intake/Output from previous day: 08/15 0701 - 08/16 0700 In: -  Out: 400 [Urine:400] Total I/O In: 3 [I.V.:3] Out: -    Physical Exam: General: Alert, awake, oriented x1, in no acute distress. HEENT: No bruits, no goiter. Heart: Regular rate and rhythm, without murmurs, rubs, gallops. Lungs: Clear to auscultation bilaterally. Abdomen: Soft, nontender, nondistended, positive bowel sounds. Extremities: No clubbing cyanosis or edema with positive pedal pulses. Neuro: Grossly intact, nonfocal.    Lab Results: Basic Metabolic Panel:  Basename 05/02/12 0100 05/01/12 0802 05/01/12 0356  NA 139 -- 140  K 3.6 -- 3.9  CL 105 -- 105  CO2 24 -- 22  GLUCOSE 127* -- 148*  BUN 17 -- 16  CREATININE 1.21 1.13 --  CALCIUM 9.3 -- 10.1  MG -- 1.8 --  PHOS -- -- --   Liver Function Tests:  Basename 05/02/12 0100 05/01/12 0356  AST 13 12  ALT 7 7  ALKPHOS 86 104  BILITOT 0.2* 0.3  PROT 6.5 7.5  ALBUMIN 2.7* 3.3*   CBC:  Basename 05/02/12 0100 05/01/12 0802  WBC 7.3 7.6  NEUTROABS -- --  HGB 8.8* 10.4*  HCT 28.4* 33.5*  MCV 86.3 87.0  PLT 358 390   Cardiac Enzymes:  Basename 05/02/12 0100 05/01/12 1709 05/01/12 0943  CKTOTAL 31 37 37  CKMB 1.8 2.2 2.0  CKMBINDEX -- -- --  TROPONINI <0.30 <0.30 <0.30   CBG:  Basename 05/02/12 1106 05/02/12 0847 05/02/12 0742 05/01/12 1949 05/01/12 1632 05/01/12 1123  GLUCAP 155* 97 59* 217* 224* 124*   Hemoglobin A1C:  Basename 05/01/12 0943  HGBA1C 7.9*   Thyroid Function Tests:  Basename 05/01/12 0943  TSH 1.181  T4TOTAL --  FREET4 --  T3FREE --  THYROIDAB --    Coagulation:  Basename 05/01/12 0356  LABPROT 14.7  INR 1.13   Urine Drug Screen: Drugs of Abuse     Component Value Date/Time   LABOPIA NONE DETECTED 06/20/2010 0018   COCAINSCRNUR NONE DETECTED 06/20/2010 0018   LABBENZ NONE DETECTED 06/20/2010 0018   AMPHETMU NONE DETECTED 06/20/2010 0018   THCU NONE DETECTED 06/20/2010 0018   LABBARB  Value: NONE DETECTED        DRUG SCREEN FOR MEDICAL PURPOSES ONLY.  IF CONFIRMATION IS NEEDED FOR ANY PURPOSE, NOTIFY LAB WITHIN 5 DAYS.        LOWEST DETECTABLE LIMITS FOR URINE DRUG SCREEN Drug Class       Cutoff (ng/mL) Amphetamine      1000 Barbiturate      200 Benzodiazepine   200 Tricyclics       300 Opiates          300 Cocaine          300 THC              50 06/20/2010 0018    Urinalysis:  Basename 05/01/12 0544  COLORURINE YELLOW  LABSPEC 1.017  PHURINE 5.5  GLUCOSEU NEGATIVE  HGBUR NEGATIVE  BILIRUBINUR SMALL*  KETONESUR NEGATIVE  PROTEINUR 100*  UROBILINOGEN 0.2  NITRITE NEGATIVE  LEUKOCYTESUR NEGATIVE    Studies/Results: Ct Head Wo  Contrast  05/01/2012  *RADIOLOGY REPORT*  Clinical Data: Syncopal episode.  CT HEAD WITHOUT CONTRAST  Technique:  Contiguous axial images were obtained from the base of the skull through the vertex without contrast.  Comparison: 11/15/2011 head CT.  Findings: No mass lesion, mass effect, midline shift, hydrocephalus, hemorrhage.  No acute territorial cortical ischemia/infarct. Atrophy and chronic ischemic white matter disease is present.  Intracranial atherosclerosis.  Mastoid air cells and paranasal sinuses are within normal limits.  Calvarium intact.  IMPRESSION: Atrophy and chronic ischemic white matter disease without acute intracranial abnormality.  Original Report Authenticated By: Andreas Newport, M.D.    Medications: Scheduled Meds:   . allopurinol  300 mg Oral Daily  . aspirin EC  81 mg Oral Daily  . docusate sodium  100 mg Oral BID  . furosemide  40 mg Oral Daily  . glimepiride  2 mg Oral  QAC breakfast  . heparin  5,000 Units Subcutaneous Q8H  . insulin aspart  0-9 Units Subcutaneous TID WC  . insulin glargine  15 Units Subcutaneous QHS  . loratadine  10 mg Oral Daily  . metoprolol tartrate  12.5 mg Oral BID  . mirtazapine  15 mg Oral QHS  . nitrofurantoin  100 mg Oral QAC breakfast  . sertraline  50 mg Oral Daily  . sodium chloride  250 mL Intravenous Once  . sodium chloride  3 mL Intravenous Q12H  . Tamsulosin HCl  0.4 mg Oral Daily   Continuous Infusions:   . sodium chloride     PRN Meds:.acetaminophen, acetaminophen, HYDROcodone-acetaminophen, LORazepam, ondansetron (ZOFRAN) IV, ondansetron, polyvinyl alcohol  Assessment/Plan:  Principal Problem:  *Bradycardia Active Problems:  DIABETES MELLITUS, TYPE II  HYPERTENSION  Syncope  Orthostatic hypotension   #1 Syncope: Related to bradycardia and orthostatic hypotension. Metoprolol dose has been reduced. Continue IVF hydration. Spoke with daughter [name], who wants her father placed at SNF as she feels she can no longer provide the care he needs at home. Awaiting PT eval. Will request SW assistance as well. Otherwise is ready for DC.   LOS: 1 day   [facility] Pager: [phone] 05/02/2012, 1:40 PM

## 2012-05-02 NOTE — Clinical Social Work Psychosocial (Signed)
     Clinical Social Work Department BRIEF PSYCHOSOCIAL ASSESSMENT 05/02/2012  Patient:  [name], [name]     Account Number:  [id number]     Admit date:  05/01/2012  Clinical Social Worker:  Doree Albee  Date/Time:  05/02/2012 02:19 PM  Referred by:  RN  Date Referred:  05/02/2012 Referred for  SNF Placement  ALF Placement   Other Referral:   Interview type:  Patient Other interview type:   patient son, pt daughter [name]    PSYCHOSOCIAL DATA Living Status:  FAMILY Admitted from facility:   Level of care:   Primary support name:  [name] Primary support relationship to patient:  CHILD, ADULT Degree of support available:   strong, provides care for pt at home.    CURRENT CONCERNS Current Concerns  Post-Acute Placement   Other Concerns:    SOCIAL WORK ASSESSMENT / PLAN CSW met with pt and pt son at bedside to introduced csw self and csw role. Pt shared that patient lives with pt daughter in pt daughter home. Pt son stated that all three children help with pt needs, but since pt daughter is in the health care field she cares for him the best in her home.    Pt gave CSW permission to discuss pt discharge plans with pt daughter.    CSW spoke with pt daughter by phone who stated she was providing care for pt at home, however his care was becoming to much for pt daughter to handle.    CSW and pt daughter discussed options of short term rehab in Skilled nursing as well as Assisted living. CSW and pt daughter discussed the process of a physical therapy evaluation to help determine pt needs in snf and alf. PT daughter verbalized understanding.    Pt daughter shared interested in [facility] ALF. CSW left message with admissions at [facility] to discuss placement for pt. Per pt daughter, ALF was coming to assess patient today.   Assessment/plan status:  Psychosocial Support/Ongoing Assessment of Needs Other assessment/ plan:   Information/referral to  community resources:   skilled nursing/assisted living facilities    PATIENTS/FAMILYS RESPONSE TO PLAN OF CARE: Pt and pt family thanked csw for concern and support. Pt is motivated to discharge when medically stable.    CSW awaiting pt/ot evaluation to help determine pt disposition needs.

## 2012-05-02 NOTE — Progress Notes (Signed)
Inpatient Diabetes Program Recommendations  AACE/ADA: New Consensus Statement on Inpatient Glycemic Control (2013)  Target Ranges:  Prepandial:   less than 140 mg/dL      Peak postprandial:   less than 180 mg/dL (1-2 hours)      Critically ill patients:  140 - 180 mg/dL    Results for CAZ, WEAVER (MRN [id number]) as of 05/02/2012 10:53  Ref. Range 05/02/2012 07:42  Glucose-Capillary Latest Range: 70-99 mg/dL 59 (L)     Inpatient Diabetes Program Recommendations Insulin - Basal: Hypoglycemic this morning after receiving 15 units Lantus the evening prior- May want to reduce Lantus to 12 units QHS if patient continues to have AM hypoglycemia.  Note: Will follow. Ambrose Finland RN, MSN, CDE Diabetes Coordinator Inpatient Diabetes Program [phone]

## 2012-05-02 NOTE — Clinical Social Work Placement (Addendum)
     Clinical Social Work Department CLINICAL SOCIAL WORK PLACEMENT NOTE 05/05/2012  Patient:  [name], [name]  Account Number:  [id number] Admit date:  05/01/2012  Clinical Social Worker:  Doree Albee  Date/time:  05/02/2012 05:53 PM  Clinical Social Work is seeking post-discharge placement for this patient at the following level of care:   SKILLED NURSING   (*CSW will update this form in Epic as items are completed)   05/02/2012  Patient/family provided with [facility] Department of Clinical Social Works list of facilities offering this level of care within the geographic area requested by the patient (or if unable, by the patients family).  05/02/2012  Patient/family informed of their freedom to choose among providers that offer the needed level of care, that participate in Medicare, Medicaid or managed care program needed by the patient, have an available bed and are willing to accept the patient.  05/02/2012  Patient/family informed of [facility] ownership interest in [facility], as well as of the fact that they are under no obligation to receive care at this facility.  PASARR submitted to EDS on 05/02/2012 PASARR number received from EDS on 05/02/2012  FL2 transmitted to all facilities in geographic area requested by pt/family on  05/02/2012 FL2 transmitted to all facilities within larger geographic area on   Patient informed that his/her managed care company has contracts with or will negotiate with  certain facilities, including the following:     Patient/family informed of bed offers received:  05/04/2012 Patient chooses bed at [facility] Physician recommends and patient chooses bed at    Patient to be transferred to [facility] on  05/05/2012 Patient to be transferred to facility by pt daughter  The following physician request were entered in Epic:   Additional Comments:

## 2012-05-02 NOTE — Progress Notes (Signed)
CSW awaiting for pt/ot eval to assist with disposition needs. ALF is coming to assess patient in order to determine if pt would be appropriate at alf.   .Clinical social worker continuing to follow pt to assist with pt dc plans and further csw needs.   Catha Gosselin, Theresia Majors  [phone] .05/02/2012 1449pm

## 2012-05-03 DIAGNOSIS — M109 Gout, unspecified: Secondary | ICD-10-CM

## 2012-05-03 LAB — GLUCOSE, CAPILLARY: Glucose-Capillary: 150 mg/dL — ABNORMAL HIGH (ref 70–99)

## 2012-05-03 MED ORDER — INSULIN GLARGINE 100 UNIT/ML ~~LOC~~ SOLN
8.0000 [IU] | Freq: Every day | SUBCUTANEOUS | Status: DC
  Administered 2012-05-03 – 2012-05-04 (×2): 8 [IU] via SUBCUTANEOUS

## 2012-05-03 MED ORDER — PREDNISONE 50 MG PO TABS
60.0000 mg | ORAL_TABLET | Freq: Once | ORAL | Status: AC
  Administered 2012-05-03: 60 mg via ORAL
  Filled 2012-05-03: qty 1

## 2012-05-03 NOTE — Plan of Care (Signed)
Problem: Discharge Progression Outcomes Goal: Other Discharge Outcomes/Goals Outcome: Progressing Social work following for disposition to ALF vs. discharge home with family and home health.

## 2012-05-03 NOTE — Plan of Care (Signed)
Problem: Consults Goal: General Medical Patient Education See Patient Education Module for specific education. Outcome: Progressing Education needs to be done with family/caregiver present.  Patient with poor cognition and memory deficits related to advanced dementia.

## 2012-05-03 NOTE — Progress Notes (Signed)
Hypoglycemic Event  CBG: 63  Treatment: 15 GM carbohydrate snack  Symptoms: None  Follow-up CBG: Time:0905 CBG Result:120  Possible Reasons for Event: Unknown  Comments/MD notified:    Sabeen Piechocki Hope  Remember to initiate Hypoglycemia Order Set & complete

## 2012-05-03 NOTE — Progress Notes (Signed)
Subjective: Complains of right hand pain: it is visibly swollen.  Objective: Vital signs in last 24 hours: Temp:  [98.3 F (36.8 C)-98.7 F (37.1 C)] 98.7 F (37.1 C) (08/17 1300) Pulse Rate:  [68-89] 71  (08/17 1300) Resp:  [16] 16  (08/17 0600) BP: (110-169)/(65-85) 137/75 mmHg (08/17 1300) SpO2:  [97 %-99 %] 98 % (08/17 1300) Weight change:  Last BM Date: 05/01/12  Intake/Output from previous day: 08/16 0701 - 08/17 0700 In: 243 [P.O.:240; I.V.:3] Out: 550 [Urine:550] Total I/O In: 360 [P.O.:360] Out: -    Physical Exam: General: Alert, awake, oriented x1, in no acute distress. HEENT: No bruits, no goiter. Heart: Regular rate and rhythm, without murmurs, rubs, gallops. Lungs: Clear to auscultation bilaterally. Abdomen: Soft, nontender, nondistended, positive bowel sounds. Extremities: No clubbing cyanosis or edema with positive pedal pulses. Right hand swollen around wrist area. Has chronic dislocation of third methaphalyngeal. Neuro: Grossly intact, nonfocal.    Lab Results: Basic Metabolic Panel:  Basename 05/02/12 0100 05/01/12 0802 05/01/12 0356  NA 139 -- 140  K 3.6 -- 3.9  CL 105 -- 105  CO2 24 -- 22  GLUCOSE 127* -- 148*  BUN 17 -- 16  CREATININE 1.21 1.13 --  CALCIUM 9.3 -- 10.1  MG -- 1.8 --  PHOS -- -- --   Liver Function Tests:  Basename 05/02/12 0100 05/01/12 0356  AST 13 12  ALT 7 7  ALKPHOS 86 104  BILITOT 0.2* 0.3  PROT 6.5 7.5  ALBUMIN 2.7* 3.3*   CBC:  Basename 05/02/12 0100 05/01/12 0802  WBC 7.3 7.6  NEUTROABS -- --  HGB 8.8* 10.4*  HCT 28.4* 33.5*  MCV 86.3 87.0  PLT 358 390   Cardiac Enzymes:  Basename 05/02/12 0100 05/01/12 1709 05/01/12 0943  CKTOTAL 31 37 37  CKMB 1.8 2.2 2.0  CKMBINDEX -- -- --  TROPONINI <0.30 <0.30 <0.30   CBG:  Basename 05/03/12 1120 05/03/12 0905 05/03/12 0749 05/02/12 2113 05/02/12 1647 05/02/12 1106  GLUCAP 150* 120* 63* 185* 112* 155*   Hemoglobin A1C:  Basename 05/01/12 0943    HGBA1C 7.9*   Thyroid Function Tests:  Basename 05/01/12 0943  TSH 1.181  T4TOTAL --  FREET4 --  T3FREE --  THYROIDAB --   Coagulation:  Basename 05/01/12 0356  LABPROT 14.7  INR 1.13   Urine Drug Screen: Drugs of Abuse     Component Value Date/Time   LABOPIA NONE DETECTED 06/20/2010 0018   COCAINSCRNUR NONE DETECTED 06/20/2010 0018   LABBENZ NONE DETECTED 06/20/2010 0018   AMPHETMU NONE DETECTED 06/20/2010 0018   THCU NONE DETECTED 06/20/2010 0018   LABBARB  Value: NONE DETECTED        DRUG SCREEN FOR MEDICAL PURPOSES ONLY.  IF CONFIRMATION IS NEEDED FOR ANY PURPOSE, NOTIFY LAB WITHIN 5 DAYS.        LOWEST DETECTABLE LIMITS FOR URINE DRUG SCREEN Drug Class       Cutoff (ng/mL) Amphetamine      1000 Barbiturate      200 Benzodiazepine   200 Tricyclics       300 Opiates          300 Cocaine          300 THC              50 06/20/2010 0018    Urinalysis:  Basename 05/01/12 0544  COLORURINE YELLOW  LABSPEC 1.017  PHURINE 5.5  GLUCOSEU NEGATIVE  HGBUR NEGATIVE  BILIRUBINUR SMALL*  KETONESUR NEGATIVE  PROTEINUR 100*  UROBILINOGEN 0.2  NITRITE NEGATIVE  LEUKOCYTESUR NEGATIVE    Studies/Results: No results found.  Medications: Scheduled Meds:    . allopurinol  300 mg Oral Daily  . aspirin EC  81 mg Oral Daily  . docusate sodium  100 mg Oral BID  . furosemide  40 mg Oral Daily  . glimepiride  2 mg Oral QAC breakfast  . heparin  5,000 Units Subcutaneous Q8H  . insulin aspart  0-9 Units Subcutaneous TID WC  . insulin glargine  10 Units Subcutaneous Once  . insulin glargine  15 Units Subcutaneous QHS  . loratadine  10 mg Oral Daily  . metoprolol tartrate  12.5 mg Oral BID  . mirtazapine  15 mg Oral QHS  . nitrofurantoin  100 mg Oral QAC breakfast  . sertraline  50 mg Oral Daily  . sodium chloride  250 mL Intravenous Once  . sodium chloride  3 mL Intravenous Q12H  . Tamsulosin HCl  0.4 mg Oral Daily   Continuous Infusions:    . sodium chloride     PRN  Meds:.acetaminophen, acetaminophen, HYDROcodone-acetaminophen, LORazepam, ondansetron (ZOFRAN) IV, ondansetron, polyvinyl alcohol  Assessment/Plan:  Principal Problem:  *Bradycardia Active Problems:  DIABETES MELLITUS, TYPE II  GOUT  HYPERTENSION  Syncope  Orthostatic hypotension   #1 Syncope: Related to bradycardia and orthostatic hypotension. Improved.  Metoprolol dose has been reduced. Continue IVF hydration. Spoke with daughter [name], who wants her father placed at SNF as she feels she can no longer provide the care he needs at home. Otherwise he is ready for DC.  #2 Gout right wrist: quick steroid burst and taper.   LOS: 2 days   [facility] Pager: [phone] 05/03/2012, 2:48 PM

## 2012-05-04 LAB — GLUCOSE, CAPILLARY
Glucose-Capillary: 378 mg/dL — ABNORMAL HIGH (ref 70–99)
Glucose-Capillary: 370 mg/dL — ABNORMAL HIGH (ref 70–99)

## 2012-05-04 MED ORDER — PREDNISONE 20 MG PO TABS
40.0000 mg | ORAL_TABLET | Freq: Once | ORAL | Status: AC
  Administered 2012-05-04: 40 mg via ORAL
  Filled 2012-05-04: qty 2

## 2012-05-04 NOTE — Progress Notes (Signed)
Pt CBG's today 378, 357, 370.  Dr. Ardyth Harps notified; no new therapy per MD order.  Will continue to monitor pt.

## 2012-05-04 NOTE — Progress Notes (Signed)
Subjective: Right hand pain has improved, it is much less edematous than yesterday. He has been hyperglycemic overnight.  Objective: Vital signs in last 24 hours: Temp:  [98.5 F (36.9 C)-99 F (37.2 C)] 98.5 F (36.9 C) (08/18 0530) Pulse Rate:  [71-78] 72  (08/18 0952) Resp:  [17-19] 19  (08/18 0530) BP: (137-164)/(70-87) 142/70 mmHg (08/18 0952) SpO2:  [97 %-100 %] 100 % (08/18 0530) Weight change:  Last BM Date:  (pt does not remember last BM)  Intake/Output from previous day: 08/17 0701 - 08/18 0700 In: 360 [P.O.:360] Out: -      Physical Exam: General: Alert, awake, oriented x2, in no acute distress. HEENT: No bruits, no goiter. Heart: Regular rate and rhythm, without murmurs, rubs, gallops. Lungs: Clear to auscultation bilaterally. Abdomen: Soft, nontender, nondistended, positive bowel sounds. Extremities: No clubbing cyanosis or edema with positive pedal pulses. Right hand swollen around wrist area. Has chronic dislocation of third methaphalyngeal. Neuro: Grossly intact, nonfocal.    Lab Results: Basic Metabolic Panel:  Basename 05/02/12 0100  NA 139  K 3.6  CL 105  CO2 24  GLUCOSE 127*  BUN 17  CREATININE 1.21  CALCIUM 9.3  MG --  PHOS --   Liver Function Tests:  Basename 05/02/12 0100  AST 13  ALT 7  ALKPHOS 86  BILITOT 0.2*  PROT 6.5  ALBUMIN 2.7*   CBC:  Basename 05/02/12 0100  WBC 7.3  NEUTROABS --  HGB 8.8*  HCT 28.4*  MCV 86.3  PLT 358   Cardiac Enzymes:  Basename 05/02/12 0100 05/01/12 1709  CKTOTAL 31 37  CKMB 1.8 2.2  CKMBINDEX -- --  TROPONINI <0.30 <0.30   CBG:  Basename 05/04/12 0729 05/03/12 2107 05/03/12 1631 05/03/12 1120 05/03/12 0905 05/03/12 0749  GLUCAP 378* 215* 121* 150* 120* 63*   Urine Drug Screen: Drugs of Abuse     Component Value Date/Time   LABOPIA NONE DETECTED 06/20/2010 0018   COCAINSCRNUR NONE DETECTED 06/20/2010 0018   LABBENZ NONE DETECTED 06/20/2010 0018   AMPHETMU NONE DETECTED 06/20/2010  0018   THCU NONE DETECTED 06/20/2010 0018   LABBARB  Value: NONE DETECTED        DRUG SCREEN FOR MEDICAL PURPOSES ONLY.  IF CONFIRMATION IS NEEDED FOR ANY PURPOSE, NOTIFY LAB WITHIN 5 DAYS.        LOWEST DETECTABLE LIMITS FOR URINE DRUG SCREEN Drug Class       Cutoff (ng/mL) Amphetamine      1000 Barbiturate      200 Benzodiazepine   200 Tricyclics       300 Opiates          300 Cocaine          300 THC              50 06/20/2010 0018     Studies/Results: No results found.  Medications: Scheduled Meds:    . allopurinol  300 mg Oral Daily  . aspirin EC  81 mg Oral Daily  . docusate sodium  100 mg Oral BID  . furosemide  40 mg Oral Daily  . glimepiride  2 mg Oral QAC breakfast  . heparin  5,000 Units Subcutaneous Q8H  . insulin aspart  0-9 Units Subcutaneous TID WC  . insulin glargine  8 Units Subcutaneous QHS  . loratadine  10 mg Oral Daily  . metoprolol tartrate  12.5 mg Oral BID  . mirtazapine  15 mg Oral QHS  . nitrofurantoin  100 mg  Oral QAC breakfast  . predniSONE  60 mg Oral Once  . sertraline  50 mg Oral Daily  . sodium chloride  250 mL Intravenous Once  . sodium chloride  3 mL Intravenous Q12H  . Tamsulosin HCl  0.4 mg Oral Daily  . DISCONTD: insulin glargine  15 Units Subcutaneous QHS   Continuous Infusions:    . sodium chloride     PRN Meds:.acetaminophen, acetaminophen, HYDROcodone-acetaminophen, LORazepam, ondansetron (ZOFRAN) IV, ondansetron, polyvinyl alcohol  Assessment/Plan:  Principal Problem:  *Bradycardia Active Problems:  DIABETES MELLITUS, TYPE II  GOUT  HYPERTENSION  Syncope  Orthostatic hypotension   #1 Syncope: Related to bradycardia and orthostatic hypotension. Improved.  Metoprolol dose has been reduced. Continue IVF hydration. Spoke with daughter [name], who wants her father placed at SNF as she feels she can no longer provide the care he needs at home. Otherwise he is ready for DC.  #2 Gout right wrist: Much improved with  steroid burst and  taper.  #3 type 2 diabetes: Initially had been having some hypoglycemia 48 hours ago, likely related to decreased by mouth intake with a relatively high dose of basal insulin. His Levemir was decreased from 15-10 units at bedtime. Now he is hyperglycemic with steroids given for his right wrist gout. He should quickly start improving with titration of steroids. Will not increase basal back up again as his by mouth intake remains low.  #4 disposition: Await SNF bed which will hopefully happen tomorrow.   LOS: 3 days   [facility] Pager: [phone] 05/04/2012, 11:07 AM

## 2012-05-05 ENCOUNTER — Encounter (HOSPITAL_COMMUNITY): Payer: Self-pay | Admitting: General Practice

## 2012-05-05 LAB — GLUCOSE, CAPILLARY: Glucose-Capillary: 269 mg/dL — ABNORMAL HIGH (ref 70–99)

## 2012-05-05 MED ORDER — METOPROLOL TARTRATE 12.5 MG HALF TABLET: 12.5000 mg | ORAL_TABLET | Freq: Two times a day (BID) | ORAL | Status: DC

## 2012-05-05 MED ORDER — PREDNISONE (PAK) 10 MG PO TABS: 10.0000 mg | ORAL_TABLET | Freq: Every day | ORAL | Status: AC

## 2012-05-05 NOTE — Discharge Summary (Signed)
Physician Discharge Summary  Patient ID: [name] MRN: [id number] DOB/AGE: 03/13/1926 78 y.o.  Admit date: 05/01/2012 Discharge date: 05/05/2012  Primary Care Physician:  Emeterio Reeve, MD   Discharge Diagnoses:    Principal Problem:  *Bradycardia Active Problems:  DIABETES MELLITUS, TYPE II  GOUT  HYPERTENSION  Syncope  Orthostatic hypotension    Medication List  As of 05/05/2012 12:53 PM   STOP taking these medications         acetaminophen 325 MG tablet      nitrofurantoin 100 MG capsule      PRESCRIPTION MEDICATION      sodium chloride 5 % ophthalmic solution         TAKE these medications         allopurinol 300 MG tablet   Commonly known as: ZYLOPRIM   Take 300 mg by mouth daily.      cetirizine 10 MG tablet   Commonly known as: ZYRTEC   Take 10 mg by mouth daily as needed.      docusate sodium 100 MG capsule   Commonly known as: COLACE   Take 100 mg by mouth 2 (two) times daily.      furosemide 80 MG tablet   Commonly known as: LASIX   Take 40 mg by mouth daily.      glimepiride 2 MG tablet   Commonly known as: AMARYL   Take 2 mg by mouth daily before breakfast.      insulin glargine 100 UNIT/ML injection   Commonly known as: LANTUS   Inject 15 Units into the skin at bedtime.      LORazepam 0.5 MG tablet   Commonly known as: ATIVAN   Take 0.5 mg by mouth every 6 (six) hours as needed. Anxiety      metoprolol tartrate 12.5 mg Tabs   Commonly known as: LOPRESSOR   Take 0.5 tablets (12.5 mg total) by mouth 2 (two) times daily.      mirtazapine 15 MG tablet   Commonly known as: REMERON   Take 15 mg by mouth at bedtime.      polyvinyl alcohol 1.4 % ophthalmic solution   Commonly known as: LIQUIFILM TEARS   Place 1 drop into both eyes as needed. Dry eyes      predniSONE 10 MG tablet   Commonly known as: STERAPRED UNI-PAK   Take 1 tablet (10 mg total) by mouth daily. Take 2 tablets today, 1 tablet tomorrow and the discontinue.        sertraline 50 MG tablet   Commonly known as: ZOLOFT   Take 50 mg by mouth daily.      simvastatin 20 MG tablet   Commonly known as: ZOCOR   Take 20 mg by mouth at bedtime.      Tamsulosin HCl 0.4 MG Caps   Commonly known as: FLOMAX   Take 1 capsule (0.4 mg total) by mouth daily.             Disposition and Follow-up:  Will be discharged to SNF today in stable and improved condition.  Consults:  None    Significant Diagnostic Studies:  Ct Head Wo Contrast  05/01/2012  *RADIOLOGY REPORT*  Clinical Data: Syncopal episode.  CT HEAD WITHOUT CONTRAST  Technique:  Contiguous axial images were obtained from the base of the skull through the vertex without contrast.  Comparison: 11/15/2011 head CT.  Findings: No mass lesion, mass effect, midline shift, hydrocephalus, hemorrhage.  No acute territorial cortical ischemia/infarct.  Atrophy and chronic ischemic white matter disease is present.  Intracranial atherosclerosis.  Mastoid air cells and paranasal sinuses are within normal limits.  Calvarium intact.  IMPRESSION: Atrophy and chronic ischemic white matter disease without acute intracranial abnormality.  Original Report Authenticated By: Andreas Newport, M.D.   Brief H and P: For complete details please refer to admission H and P, but in brief patient is a 78 y.o. male, with past medical history significant for coronary artery disease chronic kidney disease prostate cancer and others, presenting today with a presyncopal episode that occurred while he was trying to get up from the couch. Patient denies any chest pains shortness of breath, palpitations but reports nausea and cold sweats. no complete loss of consciousness. Patient has no history of seizures. He had the same episode 2 days ago again when trying to get up from chair. No report of shortness of breath burning on urination or headache. We were asked to admit him for further evaluation and management.      Hospital Course:   Principal Problem:  *Bradycardia Active Problems:  DIABETES MELLITUS, TYPE II  GOUT  HYPERTENSION  Syncope  Orthostatic hypotension   #1 Syncope: 2/2 bradycardia and orthostatic hypotension. Metoprolol dose has been reduced.  #2 Right wrist gout: improved. Continue steroid taper for 2 more days.  #3 Dm 2: CBGs have been elevated presumably 2/2 steroids. Follow at SNF.  Chronic medical issues are stable. Will go to SNF today for continued physical therapy for deconditioning/failure to thrive.   Time spent on Discharge: Greater than 30 minutes.  SignedChaya Jan [facility] Pager: [phone] 05/05/2012, 12:53 PM

## 2012-05-05 NOTE — Progress Notes (Signed)
Physical Therapy Treatment Patient Details Name: [name] MRN: [id number] DOB: 08-Aug-1926 Today's Date: 05/05/2012 Time: 3664-4034 PT Time Calculation (min): 25 min  PT Assessment / Plan / Recommendation Comments on Treatment Session  Pt continues to need min (A) for mobility however very willing to work and able to increase ambulation distance.      Follow Up Recommendations  Skilled nursing facility ((If unable to get ALF with HHPT))    Barriers to Discharge        Equipment Recommendations  None recommended by PT    Recommendations for Other Services    Frequency Min 3X/week   Plan Discharge plan needs to be updated;Frequency remains appropriate    Precautions / Restrictions Precautions Precautions: Fall   Pertinent Vitals/Pain No c/o pain    Mobility  Bed Mobility Bed Mobility: Sit to Supine Sit to Supine: 6: Modified independent (Device/Increase time) Transfers Transfers: Sit to Stand;Stand to Sit Sit to Stand: 4: Min guard;From bed;From chair/3-in-1;4: Min assist Stand to Sit: 4: Min guard;To chair/3-in-1 Details for Transfer Assistance: Pt performed sit -> stand x 5 with increase (A) needed due to fatigue.  Max cues for hand placement. Ambulation/Gait Ambulation/Gait Assistance: 4: Min assist Ambulation Distance (Feet): 250 Feet Assistive device: Rolling walker Ambulation/Gait Assistance Details: (A) to manage RW with max cues for hand placement and body position within RW. Gait Pattern: Narrow base of support;Right flexed knee in stance;Left flexed knee in stance;Trunk flexed    Exercises     PT Diagnosis:    PT Problem List:   PT Treatment Interventions:     PT Goals Acute Rehab PT Goals PT Goal Formulation: With patient Time For Goal Achievement: 05/16/12 Potential to Achieve Goals: Good Pt will go Sit to Stand: with modified independence PT Goal: Sit to Stand - Progress: Progressing toward goal Pt will go Stand to Sit: with modified  independence PT Goal: Stand to Sit - Progress: Progressing toward goal Pt will Ambulate: 51 - 150 feet;with supervision;with least restrictive assistive device PT Goal: Ambulate - Progress: Progressing toward goal  Visit Information  Last PT Received On: 05/05/12 Assistance Needed: +1    Subjective Data  Subjective: "I think I'm at [name]." Patient Stated Goal: Return home   Cognition  Overall Cognitive Status: History of cognitive impairments - at baseline Arousal/Alertness: Awake/alert Orientation Level: Disoriented to;Time;Place Behavior During Session: [facility] for tasks performed    Balance  Balance Balance Assessed: Yes Static Standing Balance Static Standing - Balance Support: During functional activity;No upper extremity supported Static Standing - Level of Assistance: 5: Stand by assistance  End of Session PT - End of Session Equipment Utilized During Treatment: Gait belt Activity Tolerance: Patient tolerated treatment well Patient left: in chair;with call bell/phone within reach;with bed alarm set Nurse Communication: Mobility status   GP     [name] 05/05/2012, 9:48 AM Jake Shark, PT DPT [phone]

## 2012-05-05 NOTE — Progress Notes (Signed)
.  Clinical social worker assisted with patient discharge to skilled nursing facility, [facility]. Patient transportation will be provided by pt daughter, in pt daughter car to facility. Pt daughter provided pt chart copy to give to facility. .No further Clinical Social Work needs, signing off.   Catha Gosselin, LCSWA  [phone] temporal arteritis.d 1416pm

## 2013-04-06 ENCOUNTER — Other Ambulatory Visit: Payer: Self-pay | Admitting: *Deleted

## 2013-04-06 DIAGNOSIS — E119 Type 2 diabetes mellitus without complications: Secondary | ICD-10-CM

## 2013-04-06 DIAGNOSIS — E785 Hyperlipidemia, unspecified: Secondary | ICD-10-CM

## 2013-04-07 ENCOUNTER — Other Ambulatory Visit: Payer: Medicare Other

## 2013-05-26 ENCOUNTER — Telehealth: Payer: Self-pay | Admitting: Oncology

## 2013-05-26 NOTE — Telephone Encounter (Signed)
S/W PT DTR KAREN AN NP APPT 10/01 @ 1:30 W/DR. SHADAD REFERRING DR. SHARON WOLTERS DX- [facility] WELCOM[facility] PACKET MAILED.

## 2013-05-26 NOTE — Telephone Encounter (Signed)
C/D 05/26/13 for appt. 06/17/13 °

## 2013-06-12 ENCOUNTER — Other Ambulatory Visit: Payer: Self-pay | Admitting: Oncology

## 2013-06-12 DIAGNOSIS — D539 Nutritional anemia, unspecified: Secondary | ICD-10-CM

## 2013-06-17 ENCOUNTER — Other Ambulatory Visit: Payer: Medicare Other | Admitting: Lab

## 2013-06-17 ENCOUNTER — Ambulatory Visit: Payer: Medicare Other

## 2013-06-17 ENCOUNTER — Ambulatory Visit: Payer: Medicare Other | Admitting: Oncology

## 2013-08-31 ENCOUNTER — Emergency Department (HOSPITAL_COMMUNITY): Payer: Medicare Other

## 2013-08-31 ENCOUNTER — Encounter (HOSPITAL_COMMUNITY): Payer: Self-pay | Admitting: Emergency Medicine

## 2013-08-31 ENCOUNTER — Emergency Department (HOSPITAL_COMMUNITY)
Admission: EM | Admit: 2013-08-31 | Discharge: 2013-08-31 | Disposition: A | Discharge status: Home / Self Care | Payer: Medicare Other | Attending: Emergency Medicine | Admitting: Emergency Medicine

## 2013-08-31 DIAGNOSIS — M1990 Unspecified osteoarthritis, unspecified site: Secondary | ICD-10-CM | POA: Insufficient documentation

## 2013-08-31 DIAGNOSIS — K219 Gastro-esophageal reflux disease without esophagitis: Secondary | ICD-10-CM | POA: Insufficient documentation

## 2013-08-31 DIAGNOSIS — E785 Hyperlipidemia, unspecified: Secondary | ICD-10-CM | POA: Insufficient documentation

## 2013-08-31 DIAGNOSIS — E119 Type 2 diabetes mellitus without complications: Secondary | ICD-10-CM | POA: Insufficient documentation

## 2013-08-31 DIAGNOSIS — Z8781 Personal history of (healed) traumatic fracture: Secondary | ICD-10-CM | POA: Insufficient documentation

## 2013-08-31 DIAGNOSIS — S62609A Fracture of unspecified phalanx of unspecified finger, initial encounter for closed fracture: Secondary | ICD-10-CM

## 2013-08-31 DIAGNOSIS — R296 Repeated falls: Secondary | ICD-10-CM | POA: Insufficient documentation

## 2013-08-31 DIAGNOSIS — D649 Anemia, unspecified: Secondary | ICD-10-CM | POA: Insufficient documentation

## 2013-08-31 DIAGNOSIS — Z79899 Other long term (current) drug therapy: Secondary | ICD-10-CM | POA: Insufficient documentation

## 2013-08-31 DIAGNOSIS — Y9301 Activity, walking, marching and hiking: Secondary | ICD-10-CM | POA: Insufficient documentation

## 2013-08-31 DIAGNOSIS — S62639A Displaced fracture of distal phalanx of unspecified finger, initial encounter for closed fracture: Secondary | ICD-10-CM | POA: Insufficient documentation

## 2013-08-31 DIAGNOSIS — I252 Old myocardial infarction: Secondary | ICD-10-CM | POA: Insufficient documentation

## 2013-08-31 DIAGNOSIS — N182 Chronic kidney disease, stage 2 (mild): Secondary | ICD-10-CM | POA: Insufficient documentation

## 2013-08-31 DIAGNOSIS — Y929 Unspecified place or not applicable: Secondary | ICD-10-CM | POA: Insufficient documentation

## 2013-08-31 DIAGNOSIS — Z87891 Personal history of nicotine dependence: Secondary | ICD-10-CM | POA: Insufficient documentation

## 2013-08-31 DIAGNOSIS — I129 Hypertensive chronic kidney disease with stage 1 through stage 4 chronic kidney disease, or unspecified chronic kidney disease: Secondary | ICD-10-CM | POA: Insufficient documentation

## 2013-08-31 DIAGNOSIS — Z8546 Personal history of malignant neoplasm of prostate: Secondary | ICD-10-CM | POA: Insufficient documentation

## 2013-08-31 DIAGNOSIS — Z794 Long term (current) use of insulin: Secondary | ICD-10-CM | POA: Insufficient documentation

## 2013-08-31 DIAGNOSIS — Z792 Long term (current) use of antibiotics: Secondary | ICD-10-CM | POA: Insufficient documentation

## 2013-08-31 DIAGNOSIS — F0390 Unspecified dementia without behavioral disturbance: Secondary | ICD-10-CM | POA: Insufficient documentation

## 2013-08-31 DIAGNOSIS — I2510 Atherosclerotic heart disease of native coronary artery without angina pectoris: Secondary | ICD-10-CM | POA: Insufficient documentation

## 2013-08-31 DIAGNOSIS — M109 Gout, unspecified: Secondary | ICD-10-CM | POA: Insufficient documentation

## 2013-08-31 DIAGNOSIS — I10 Essential (primary) hypertension: Secondary | ICD-10-CM | POA: Insufficient documentation

## 2013-08-31 MED ORDER — HYDROCODONE-ACETAMINOPHEN 5-325 MG PO TABS: ORAL_TABLET | ORAL | Status: DC

## 2013-08-31 MED ORDER — HYDROCODONE-ACETAMINOPHEN 5-325 MG PO TABS
1.0000 | ORAL_TABLET | Freq: Once | ORAL | Status: AC
  Administered 2013-08-31: 1 via ORAL
  Filled 2013-08-31: qty 1

## 2013-08-31 NOTE — Progress Notes (Signed)
Orthopedic Tech Progress Note Patient Details:  [name] [birth year] [id number]  Ortho Devices Type of Ortho Device: Finger splint Ortho Device/Splint Location: lue Ortho Device/Splint Interventions: Application   Cyndal Kasson 08/31/2013, 10:01 PM

## 2013-08-31 NOTE — ED Provider Notes (Signed)
CSN: [id number]     Arrival date & time 08/31/13  1630 History   First MD Initiated Contact with Patient 08/31/13 2121     Chief Complaint  Patient presents with  . Finger Injury    HPI  Patient presents 24 hours after a fall. Patient was standing and started to walk and his leg was asleep and he fell. He injured his left hand ring finger. It sounds like he may have caught the tip and hyperflexed the tip. It is swollen and  sore this morning. No syncope. Has otherwise been doing well for the course of the day but his finger swollen and painful  Past Medical History  Diagnosis Date  . Dementia   . Hypertension   . Gout   . Hyperlipidemia   . Fracture of lumbar spine 11-08-11  . Coronary artery disease   . Myocardial infarction 2004    NSTEMI/E-chart  . Chronic kidney disease (CKD), stage II (mild)     /E-chart  . Prostate cancer     S/P radiation  . Osteoarthritis     /E-chart  . GI bleeding 2004; 01/2011    /E-chart  . Blood transfusion   . Anemia   . Diverticulosis   . Type II diabetes mellitus   . GERD (gastroesophageal reflux disease)   . Dysrhythmia 05/01/2012    bradycardia   Past Surgical History  Procedure Laterality Date  . Left leg surgery  1950's    "injured in [place] war"  . Right shoulder surgery    . Left cataract extraction    . Tonsillectomy     No family history on file. History  Substance Use Topics  . Smoking status: Former Smoker -- 1.00 packs/day for 35 years    Types: Cigarettes  . Smokeless tobacco: Never Used     Comment: 11/16/11 "quit smoking 20-30 yr ago"  . Alcohol Use: No    Review of Systems  Musculoskeletal:       In the left hand middle finger at PIP joint. It is passively flexed. He cannot extend it.    Allergies  Aspirin and Ibuprofen  Home Medications   Current Outpatient Rx  Name  Route  Sig  Dispense  Refill  . allopurinol (ZYLOPRIM) 300 MG tablet   Oral   Take 300 mg by mouth daily.         Marland Kitchen amoxicillin  (AMOXIL) 875 MG tablet   Oral   Take 875 mg by mouth 2 (two) times daily.         Marland Kitchen docusate sodium (COLACE) 100 MG capsule   Oral   Take 100 mg by mouth 2 (two) times daily.         . Fe Fum-FePoly-Vit C-Vit B3 (INTEGRA) 62.5-62.5-40-3 MG CAPS   Oral   Take 1 capsule by mouth daily.         . furosemide (LASIX) 40 MG tablet   Oral   Take 40 mg by mouth daily.         . hydrOXYzine (ATARAX/VISTARIL) 25 MG tablet   Oral   Take 25 mg by mouth at bedtime as needed for itching.         . insulin aspart (NOVOLOG) 100 UNIT/ML injection   Subcutaneous   Inject 10 Units into the skin 3 (three) times daily before meals. *doesn't take if sugar is below 70         . insulin glargine (LANTUS) 100 UNIT/ML injection   Subcutaneous  Inject 20 Units into the skin at bedtime.          . metoprolol tartrate (LOPRESSOR) 25 MG tablet   Oral   Take 12.5 mg by mouth 2 (two) times daily.         . mirtazapine (REMERON) 15 MG tablet   Oral   Take 15 mg by mouth at bedtime.         Marland Kitchen omeprazole (PRILOSEC) 20 MG capsule   Oral   Take 20 mg by mouth daily.         . sertraline (ZOLOFT) 50 MG tablet   Oral   Take 50 mg by mouth daily.         . simvastatin (ZOCOR) 20 MG tablet   Oral   Take 20 mg by mouth at bedtime.         . tamsulosin (FLOMAX) 0.4 MG CAPS capsule   Oral   Take 0.4 mg by mouth daily after breakfast.         . HYDROcodone-acetaminophen (NORCO/VICODIN) 5-325 MG per tablet      Before sleep as needed for pain   6 tablet   0    BP 192/85  Pulse 66  Temp(Src) 98.6 F (37 C) (Oral)  Resp 16  SpO2 100% Physical Exam  Constitutional: He is oriented to person, place, and time. He appears well-developed and well-nourished. No distress.  Hard of hearing. Awake alert.  HENT:  Head: Normocephalic.  Eyes: Conjunctivae are normal. Pupils are equal, round, and reactive to light. No scleral icterus.  Neck: Normal range of motion. Neck supple. No  thyromegaly present.  Cardiovascular: Normal rate and regular rhythm.  Exam reveals no gallop and no friction rub.   No murmur heard. Pulmonary/Chest: Effort normal and breath sounds normal. No respiratory distress. He has no wheezes. He has no rales.  Abdominal: Soft. Bowel sounds are normal. He exhibits no distension. There is no tenderness. There is no rebound.  Musculoskeletal: Normal range of motion.  Is amount of deformity of his left hand middle finger. He cannot extend it. It is passively in flexion. He is tender on the dorsum of the PIP. This all consistent with a bony or soft mallet deformity and hyperflexion injury.  Neurological: He is alert and oriented to person, place, and time.  Skin: Skin is warm and dry. No rash noted.  Psychiatric: He has a normal mood and affect. His behavior is normal.    ED Course  Procedures (including critical care time) Labs Review Labs Reviewed - No data to display Imaging Review No results found.  EKG Interpretation   None       MDM   1. Finger fracture, closed, initial encounter    Splinted.  Re examined. Properly placed.  DC'd with instriuctions.    Roney Marion, MD 09/03/13 0001

## 2013-08-31 NOTE — ED Notes (Addendum)
Pt fell on L hand and now feels pain to L 4th digit.  No obvious malformation noted.  Pt hard of hearing.  Pt states he fell b/c he was standing too long and his gets "got weak".

## 2013-10-02 ENCOUNTER — Emergency Department (HOSPITAL_COMMUNITY): Payer: Medicare Other

## 2013-10-02 ENCOUNTER — Encounter (HOSPITAL_COMMUNITY): Payer: Self-pay | Admitting: Emergency Medicine

## 2013-10-02 ENCOUNTER — Emergency Department (HOSPITAL_COMMUNITY)
Admission: EM | Admit: 2013-10-02 | Discharge: 2013-10-03 | Disposition: A | Discharge status: Home / Self Care | Payer: Medicare Other | Attending: Emergency Medicine | Admitting: Emergency Medicine

## 2013-10-02 DIAGNOSIS — Z794 Long term (current) use of insulin: Secondary | ICD-10-CM | POA: Insufficient documentation

## 2013-10-02 DIAGNOSIS — Z8546 Personal history of malignant neoplasm of prostate: Secondary | ICD-10-CM | POA: Insufficient documentation

## 2013-10-02 DIAGNOSIS — Z87891 Personal history of nicotine dependence: Secondary | ICD-10-CM | POA: Insufficient documentation

## 2013-10-02 DIAGNOSIS — M109 Gout, unspecified: Secondary | ICD-10-CM | POA: Insufficient documentation

## 2013-10-02 DIAGNOSIS — I2510 Atherosclerotic heart disease of native coronary artery without angina pectoris: Secondary | ICD-10-CM | POA: Insufficient documentation

## 2013-10-02 DIAGNOSIS — M1990 Unspecified osteoarthritis, unspecified site: Secondary | ICD-10-CM | POA: Insufficient documentation

## 2013-10-02 DIAGNOSIS — I129 Hypertensive chronic kidney disease with stage 1 through stage 4 chronic kidney disease, or unspecified chronic kidney disease: Secondary | ICD-10-CM | POA: Insufficient documentation

## 2013-10-02 DIAGNOSIS — K219 Gastro-esophageal reflux disease without esophagitis: Secondary | ICD-10-CM | POA: Insufficient documentation

## 2013-10-02 DIAGNOSIS — I951 Orthostatic hypotension: Secondary | ICD-10-CM | POA: Insufficient documentation

## 2013-10-02 DIAGNOSIS — Z79899 Other long term (current) drug therapy: Secondary | ICD-10-CM | POA: Insufficient documentation

## 2013-10-02 DIAGNOSIS — Z8781 Personal history of (healed) traumatic fracture: Secondary | ICD-10-CM | POA: Insufficient documentation

## 2013-10-02 DIAGNOSIS — N182 Chronic kidney disease, stage 2 (mild): Secondary | ICD-10-CM | POA: Insufficient documentation

## 2013-10-02 DIAGNOSIS — R61 Generalized hyperhidrosis: Secondary | ICD-10-CM | POA: Insufficient documentation

## 2013-10-02 DIAGNOSIS — D649 Anemia, unspecified: Secondary | ICD-10-CM | POA: Insufficient documentation

## 2013-10-02 DIAGNOSIS — E785 Hyperlipidemia, unspecified: Secondary | ICD-10-CM | POA: Insufficient documentation

## 2013-10-02 DIAGNOSIS — R112 Nausea with vomiting, unspecified: Secondary | ICD-10-CM | POA: Insufficient documentation

## 2013-10-02 DIAGNOSIS — E119 Type 2 diabetes mellitus without complications: Secondary | ICD-10-CM | POA: Insufficient documentation

## 2013-10-02 DIAGNOSIS — F0390 Unspecified dementia without behavioral disturbance: Secondary | ICD-10-CM | POA: Insufficient documentation

## 2013-10-02 DIAGNOSIS — Z792 Long term (current) use of antibiotics: Secondary | ICD-10-CM | POA: Insufficient documentation

## 2013-10-02 DIAGNOSIS — I252 Old myocardial infarction: Secondary | ICD-10-CM | POA: Insufficient documentation

## 2013-10-02 LAB — URINALYSIS, ROUTINE W REFLEX MICROSCOPIC
BILIRUBIN URINE: NEGATIVE
Glucose, UA: NEGATIVE mg/dL
HGB URINE DIPSTICK: NEGATIVE
Ketones, ur: NEGATIVE mg/dL
Leukocytes, UA: NEGATIVE
Nitrite: NEGATIVE
Protein, ur: 30 mg/dL — AB
SPECIFIC GRAVITY, URINE: 1.01 (ref 1.005–1.030)
UROBILINOGEN UA: 0.2 mg/dL (ref 0.0–1.0)
pH: 5 (ref 5.0–8.0)

## 2013-10-02 LAB — CBC WITH DIFFERENTIAL/PLATELET
BASOS ABS: 0 10*3/uL (ref 0.0–0.1)
Basophils Relative: 0 % (ref 0–1)
Eosinophils Absolute: 0 10*3/uL (ref 0.0–0.7)
Eosinophils Relative: 0 % (ref 0–5)
HCT: 37.9 % — ABNORMAL LOW (ref 39.0–52.0)
Hemoglobin: 12 g/dL — ABNORMAL LOW (ref 13.0–17.0)
Lymphocytes Relative: 2 % — ABNORMAL LOW (ref 12–46)
Lymphs Abs: 0.3 10*3/uL — ABNORMAL LOW (ref 0.7–4.0)
MCH: 28.6 pg (ref 26.0–34.0)
MCHC: 31.7 g/dL (ref 30.0–36.0)
MCV: 90.2 fL (ref 78.0–100.0)
Monocytes Absolute: 0.4 10*3/uL (ref 0.1–1.0)
Monocytes Relative: 3 % (ref 3–12)
NEUTROS ABS: 13.7 10*3/uL — AB (ref 1.7–7.7)
NEUTROS PCT: 95 % — AB (ref 43–77)
PLATELETS: 223 10*3/uL (ref 150–400)
RBC: 4.2 MIL/uL — ABNORMAL LOW (ref 4.22–5.81)
RDW: 14.6 % (ref 11.5–15.5)
WBC: 14.5 10*3/uL — AB (ref 4.0–10.5)

## 2013-10-02 LAB — COMPREHENSIVE METABOLIC PANEL
ALT: 14 U/L (ref 0–53)
AST: 23 U/L (ref 0–37)
Albumin: 3.6 g/dL (ref 3.5–5.2)
Alkaline Phosphatase: 113 U/L (ref 39–117)
BILIRUBIN TOTAL: 0.4 mg/dL (ref 0.3–1.2)
BUN: 30 mg/dL — ABNORMAL HIGH (ref 6–23)
CALCIUM: 9.7 mg/dL (ref 8.4–10.5)
CHLORIDE: 106 meq/L (ref 96–112)
CO2: 22 mEq/L (ref 19–32)
Creatinine, Ser: 1.78 mg/dL — ABNORMAL HIGH (ref 0.50–1.35)
GFR calc Af Amer: 38 mL/min — ABNORMAL LOW (ref >90)
GFR calc non Af Amer: 33 mL/min — ABNORMAL LOW (ref >90)
Glucose, Bld: 135 mg/dL — ABNORMAL HIGH (ref 70–99)
Potassium: 4 mEq/L (ref 3.7–5.3)
SODIUM: 144 meq/L (ref 137–147)
Total Protein: 7.7 g/dL (ref 6.0–8.3)

## 2013-10-02 LAB — URINE MICROSCOPIC-ADD ON

## 2013-10-02 LAB — POCT I-STAT 3, VENOUS BLOOD GAS (G3P V)
Acid-base deficit: 2 mmol/L (ref 0.0–2.0)
BICARBONATE: 24.2 meq/L — AB (ref 20.0–24.0)
O2 Saturation: 43 %
PCO2 VEN: 45.2 mmHg (ref 45.0–50.0)
Patient temperature: 99.7
TCO2: 26 mmol/L (ref 0–100)
pH, Ven: 7.34 — ABNORMAL HIGH (ref 7.250–7.300)
pO2, Ven: 26 mmHg — CL (ref 30.0–45.0)

## 2013-10-02 MED ORDER — ONDANSETRON HCL 4 MG/2ML IJ SOLN
4.0000 mg | Freq: Once | INTRAMUSCULAR | Status: AC
  Administered 2013-10-02: 4 mg via INTRAVENOUS
  Filled 2013-10-02: qty 2

## 2013-10-02 MED ORDER — ONDANSETRON 4 MG PO TBDP: 4.0000 mg | ORAL_TABLET | Freq: Three times a day (TID) | ORAL | Status: DC | PRN

## 2013-10-02 MED ORDER — SODIUM CHLORIDE 0.9 % IV BOLUS (SEPSIS)
1000.0000 mL | Freq: Once | INTRAVENOUS | Status: AC
  Administered 2013-10-02: 1000 mL via INTRAVENOUS

## 2013-10-02 NOTE — ED Notes (Signed)
Pt unable to urinate at this time.  

## 2013-10-02 NOTE — ED Notes (Signed)
Returned from CT placed on monitor.  Family at bedside.  Pt resting at this time with no complaints.

## 2013-10-02 NOTE — ED Notes (Signed)
Pt to CT at this time.

## 2013-10-02 NOTE — Discharge Instructions (Signed)

## 2013-10-02 NOTE — ED Notes (Signed)
Per EMS: family called medics for c/o confusion and altered mental status. EMS reports pt looking pale upon arrival and not following commands. Pt has improved upon arrival to ED. Pt has hx of GI bleeds. Pt unable to tell date, family states he normally can. Stroke screen negative. Pt given 4 mg of zofran en route due to vomiting prior to arrival. Pt is alert, respirations equal and unlabored, skin warm and dry

## 2013-10-02 NOTE — ED Notes (Signed)
MD at bedside. 

## 2013-10-02 NOTE — ED Provider Notes (Signed)
CSN: [id number]     Arrival date & time 10/02/13  1951 History   First MD Initiated Contact with Patient 10/02/13 2006     Chief Complaint  Patient presents with  . Fatigue  . Weakness   (Consider location/radiation/quality/duration/timing/severity/associated sxs/prior Treatment) Patient is a 80 y.o. male presenting with general illness. The history is provided by the patient and a relative.  Illness Severity:  Mild Onset quality:  Gradual Duration:  3 hours Timing:  Constant Progression:  Unchanged Chronicity:  New Associated symptoms: nausea and vomiting   Associated symptoms: no abdominal pain, no chest pain, no congestion, no cough, no diarrhea, no fever, no headaches, no myalgias, no rash, no rhinorrhea, no shortness of breath and no sore throat     Past Medical History  Diagnosis Date  . Dementia   . Hypertension   . Gout   . Hyperlipidemia   . Fracture of lumbar spine 11-08-11  . Coronary artery disease   . Myocardial infarction 2004    NSTEMI/E-chart  . Chronic kidney disease (CKD), stage II (mild)     /E-chart  . Prostate cancer     S/P radiation  . Osteoarthritis     /E-chart  . GI bleeding 2004; 01/2011    /E-chart  . Blood transfusion   . Anemia   . Diverticulosis   . Type II diabetes mellitus   . GERD (gastroesophageal reflux disease)   . Dysrhythmia 05/01/2012    bradycardia   Past Surgical History  Procedure Laterality Date  . Left leg surgery  1950's    "injured in [place] war"  . Right shoulder surgery    . Left cataract extraction    . Tonsillectomy     History reviewed. No pertinent family history. History  Substance Use Topics  . Smoking status: Former Smoker -- 1.00 packs/day for 35 years    Types: Cigarettes  . Smokeless tobacco: Never Used     Comment: 11/16/11 "quit smoking 20-30 yr ago"  . Alcohol Use: No    Review of Systems  Constitutional: Positive for diaphoresis. Negative for fever and chills.  HENT: Negative for congestion,  facial swelling, rhinorrhea and sore throat.   Eyes: Negative for discharge and visual disturbance.  Respiratory: Negative for cough and shortness of breath.   Cardiovascular: Negative for chest pain and palpitations.  Gastrointestinal: Positive for nausea and vomiting. Negative for abdominal pain and diarrhea.  Musculoskeletal: Negative for arthralgias and myalgias.  Skin: Negative for color change and rash.  Neurological: Negative for tremors, syncope and headaches.  Psychiatric/Behavioral: Negative for confusion and dysphoric mood.    Allergies  Aspirin and Ibuprofen  Home Medications   Current Outpatient Rx  Name  Route  Sig  Dispense  Refill  . allopurinol (ZYLOPRIM) 300 MG tablet   Oral   Take 300 mg by mouth daily.         Marland Kitchen amoxicillin (AMOXIL) 875 MG tablet   Oral   Take 875 mg by mouth 2 (two) times daily.         Marland Kitchen docusate sodium (COLACE) 100 MG capsule   Oral   Take 100 mg by mouth 2 (two) times daily.         . Fe Fum-FePoly-Vit C-Vit B3 (INTEGRA) 62.5-62.5-40-3 MG CAPS   Oral   Take 1 capsule by mouth daily.         . furosemide (LASIX) 40 MG tablet   Oral   Take 40 mg by mouth daily.         Marland Kitchen  HYDROcodone-acetaminophen (NORCO/VICODIN) 5-325 MG per tablet      Before sleep as needed for pain   6 tablet   0   . hydrOXYzine (ATARAX/VISTARIL) 25 MG tablet   Oral   Take 25 mg by mouth at bedtime as needed for itching.         . insulin aspart (NOVOLOG) 100 UNIT/ML injection   Subcutaneous   Inject 10 Units into the skin 3 (three) times daily before meals. *doesn't take if sugar is below 70         . insulin glargine (LANTUS) 100 UNIT/ML injection   Subcutaneous   Inject 20 Units into the skin at bedtime.          . metoprolol tartrate (LOPRESSOR) 25 MG tablet   Oral   Take 12.5 mg by mouth 2 (two) times daily.         . mirtazapine (REMERON) 15 MG tablet   Oral   Take 15 mg by mouth at bedtime.         Marland Kitchen omeprazole  (PRILOSEC) 20 MG capsule   Oral   Take 20 mg by mouth daily.         . sertraline (ZOLOFT) 50 MG tablet   Oral   Take 50 mg by mouth daily.         . simvastatin (ZOCOR) 20 MG tablet   Oral   Take 20 mg by mouth at bedtime.         . tamsulosin (FLOMAX) 0.4 MG CAPS capsule   Oral   Take 0.4 mg by mouth daily after breakfast.         . ondansetron (ZOFRAN ODT) 4 MG disintegrating tablet   Oral   Take 1 tablet (4 mg total) by mouth every 8 (eight) hours as needed for nausea or vomiting.   20 tablet   0    BP 125/70  Pulse 78  Temp(Src) 99.7 F (37.6 C) (Oral)  Resp 12  SpO2 98% Physical Exam  Constitutional: He is oriented to person, place, and time. He appears well-developed and well-nourished.  HENT:  Head: Normocephalic and atraumatic.  Eyes: EOM are normal. Pupils are equal, round, and reactive to light.  Neck: Normal range of motion. Neck supple. No JVD present.  Cardiovascular: Normal rate and regular rhythm.  Exam reveals no gallop and no friction rub.   No murmur heard. Pulmonary/Chest: No respiratory distress. He has no wheezes.  Abdominal: He exhibits no distension. There is no rebound and no guarding.  Musculoskeletal: Normal range of motion.  Neurological: He is alert and oriented to person, place, and time.  Skin: No rash noted. No pallor.  Psychiatric: He has a normal mood and affect. His behavior is normal.    ED Course  Procedures (including critical care time) Labs Review Labs Reviewed  CBC WITH DIFFERENTIAL - Abnormal; Notable for the following:    WBC 14.5 (*)    RBC 4.20 (*)    Hemoglobin 12.0 (*)    HCT 37.9 (*)    Neutrophils Relative % 95 (*)    Neutro Abs 13.7 (*)    Lymphocytes Relative 2 (*)    Lymphs Abs 0.3 (*)    All other components within normal limits  COMPREHENSIVE METABOLIC PANEL - Abnormal; Notable for the following:    Glucose, Bld 135 (*)    BUN 30 (*)    Creatinine, Ser 1.78 (*)    GFR calc non Af Amer 33 (*)  GFR calc Af Amer 38 (*)    All other components within normal limits  URINALYSIS, ROUTINE W REFLEX MICROSCOPIC - Abnormal; Notable for the following:    Protein, ur 30 (*)    All other components within normal limits  POCT I-STAT 3, BLOOD GAS (G3P V) - Abnormal; Notable for the following:    pH, Ven 7.340 (*)    pO2, Ven 26.0 (*)    Bicarbonate 24.2 (*)    All other components within normal limits  URINE MICROSCOPIC-ADD ON   Imaging Review Ct Head Wo Contrast  10/02/2013   CLINICAL DATA:  Altered mental status  EXAM: CT HEAD WITHOUT CONTRAST  TECHNIQUE: Contiguous axial images were obtained from the base of the skull through the vertex without intravenous contrast.  COMPARISON:  CT HEAD W/O CM dated 05/01/2012  FINDINGS: No acute intracranial hemorrhage. No focal mass lesion. No CT evidence of acute infarction. No midline shift or mass effect. No hydrocephalus. Basilar cisterns are patent. Paranasal sinuses and mastoid air cells are clear.  Generalized cortical atrophy and ventricular dilatation unchanged from prior. Periventricular white matter hypodensities are similar.  IMPRESSION: 1. No acute intracranial findings. 2. Chronic atrophy and microvascular disease are unchanged.   Electronically Signed   By: Suzy Bouchard M.D.   On: 10/02/2013 20:56   Dg Chest Portable 1 View  10/02/2013   CLINICAL DATA:  Vomiting, weakness  EXAM: PORTABLE CHEST - 1 VIEW  COMPARISON:  11/15/2011  FINDINGS: Mild bibasilar opacities, likely atelectasis. No focal consolidation. No pleural effusion or pneumothorax.  The heart is normal in size.  Degenerative changes of the right shoulder/acromioclavicular joint.  IMPRESSION: No evidence of acute cardiopulmonary disease.   Electronically Signed   By: Julian Hy M.D.   On: 10/02/2013 21:28    EKG Interpretation    Date/Time:  Friday October 02 2013 20:03:32 EST Ventricular Rate:  84 PR Interval:  138 QRS Duration: 77 QT Interval:  363 QTC  Calculation: 429 R Axis:   8 Text Interpretation:  Sinus rhythm Nonspecific T abnrm, anterolateral leads Baseline wander in lead(s) V6 No significant change since last tracing Confirmed by DOCHERTY  MD, MEGAN ([id number]) on 10/02/2013 8:09:38 PM            MDM   1. Nausea & vomiting   2. Orthostatic hypotension    80 yo M presents with confusion weakness today after having vomiting.  Patient with mild confusion and sweating while vomiting, ems brought the patient here.  On initial exam patient asking for something to drink, denies nausea.  AOx4.  Patient with mild increase in creatining and Bun concerning for dehydration, tolerating PO in the ED, given a fluid bolus and feels much better.  Family concerned with episode and would like him to eat prior to DC.  Patient ate a whole sandwich and was able to ambulate around the nurses station   12:37 AM:  I have discussed the diagnosis/risks/treatment options with the patient and family and believe the pt to be eligible for discharge home to follow-up with PCP. We also discussed returning to the ED immediately if new or worsening sx occur. We discussed the sx which are most concerning (e.g., syncope,chest pain) that necessitate immediate return. Any new prescriptions provided to the patient are listed below.  New Prescriptions   ONDANSETRON (ZOFRAN ODT) 4 MG DISINTEGRATING TABLET    Take 1 tablet (4 mg total) by mouth every 8 (eight) hours as needed for nausea or vomiting.  Deno Etienne, MD 10/03/13 [phone]

## 2013-10-02 NOTE — ED Notes (Signed)
Dr Tawnya Crook given a copy of blood gas venous results

## 2013-10-03 NOTE — ED Provider Notes (Signed)
Medical screening examination/treatment/procedure(s) were conducted as a shared visit with resident-physician practitioner(s) and myself.  I personally evaluated the patient during the encounter.  Pt is a 80 y.o. male with pmhx as above presenting with report of confusion pt pt's daughter and multiple episodes of vomiting today.  Pt found to have GCS 15 on my exam, was w/o complaint except for hunger, and had benign cardiopulm & abdominal exam. He was given IVF, felt improved, tolerated subway sub, was able to ambulate w/ walker in dept.  W/u shows likely mild dehydration.  I feel pt safe for d/c, Return precautions given for new or worsening symptoms including ab pain, return of vomiting, fever.  They will f/u with PCP office ASAP.    EKG Interpretation    Date/Time:  Friday October 02 2013 20:03:32 EST Ventricular Rate:  84 PR Interval:  138 QRS Duration: 77 QT Interval:  363 QTC Calculation: 429 R Axis:   8 Text Interpretation:  Sinus rhythm Nonspecific T abnrm, anterolateral leads Baseline wander in lead(s) V6 No significant change since last tracing Confirmed by [place]  MD, [place] ([id number]) on 10/02/2013 8:09:38 PM             Neta Ehlers, MD 10/03/13 [phone]

## 2014-03-30 ENCOUNTER — Ambulatory Visit: Payer: Medicare Other | Admitting: Interventional Cardiology

## 2014-04-23 ENCOUNTER — Other Ambulatory Visit: Payer: Self-pay | Admitting: Interventional Cardiology

## 2014-05-12 ENCOUNTER — Ambulatory Visit: Payer: Medicare Other | Admitting: Interventional Cardiology

## 2014-06-22 ENCOUNTER — Other Ambulatory Visit: Payer: Self-pay | Admitting: Interventional Cardiology

## 2014-06-22 ENCOUNTER — Other Ambulatory Visit: Payer: Self-pay | Admitting: *Deleted

## 2014-06-23 MED ORDER — FUROSEMIDE 40 MG PO TABS: ORAL_TABLET | ORAL | Status: DC

## 2014-07-17 ENCOUNTER — Emergency Department (HOSPITAL_COMMUNITY): Payer: Medicare Other

## 2014-07-17 ENCOUNTER — Encounter (HOSPITAL_COMMUNITY): Payer: Self-pay | Admitting: Emergency Medicine

## 2014-07-17 ENCOUNTER — Inpatient Hospital Stay (HOSPITAL_COMMUNITY): Payer: Medicare Other

## 2014-07-17 ENCOUNTER — Inpatient Hospital Stay (HOSPITAL_COMMUNITY)
Admission: EM | Admit: 2014-07-17 | Discharge: 2014-07-20 | DRG: 312 | Disposition: A | Discharge status: Home / Self Care | Payer: Medicare Other | Attending: Internal Medicine | Admitting: Internal Medicine

## 2014-07-17 DIAGNOSIS — R52 Pain, unspecified: Secondary | ICD-10-CM

## 2014-07-17 DIAGNOSIS — R55 Syncope and collapse: Principal | ICD-10-CM

## 2014-07-17 DIAGNOSIS — R627 Adult failure to thrive: Secondary | ICD-10-CM | POA: Diagnosis present

## 2014-07-17 DIAGNOSIS — IMO0001 Reserved for inherently not codable concepts without codable children: Secondary | ICD-10-CM

## 2014-07-17 DIAGNOSIS — I252 Old myocardial infarction: Secondary | ICD-10-CM | POA: Diagnosis not present

## 2014-07-17 DIAGNOSIS — F411 Generalized anxiety disorder: Secondary | ICD-10-CM

## 2014-07-17 DIAGNOSIS — R001 Bradycardia, unspecified: Secondary | ICD-10-CM | POA: Diagnosis present

## 2014-07-17 DIAGNOSIS — R531 Weakness: Secondary | ICD-10-CM

## 2014-07-17 DIAGNOSIS — I951 Orthostatic hypotension: Secondary | ICD-10-CM | POA: Diagnosis present

## 2014-07-17 DIAGNOSIS — R109 Unspecified abdominal pain: Secondary | ICD-10-CM

## 2014-07-17 DIAGNOSIS — Z87891 Personal history of nicotine dependence: Secondary | ICD-10-CM

## 2014-07-17 DIAGNOSIS — E860 Dehydration: Secondary | ICD-10-CM | POA: Diagnosis present

## 2014-07-17 DIAGNOSIS — W19XXXA Unspecified fall, initial encounter: Secondary | ICD-10-CM

## 2014-07-17 DIAGNOSIS — N182 Chronic kidney disease, stage 2 (mild): Secondary | ICD-10-CM | POA: Diagnosis present

## 2014-07-17 DIAGNOSIS — R630 Anorexia: Secondary | ICD-10-CM | POA: Diagnosis present

## 2014-07-17 DIAGNOSIS — I82442 Acute embolism and thrombosis of left tibial vein: Secondary | ICD-10-CM | POA: Diagnosis present

## 2014-07-17 DIAGNOSIS — I10 Essential (primary) hypertension: Secondary | ICD-10-CM

## 2014-07-17 DIAGNOSIS — E1165 Type 2 diabetes mellitus with hyperglycemia: Secondary | ICD-10-CM

## 2014-07-17 DIAGNOSIS — Z8546 Personal history of malignant neoplasm of prostate: Secondary | ICD-10-CM

## 2014-07-17 DIAGNOSIS — I824Z2 Acute embolism and thrombosis of unspecified deep veins of left distal lower extremity: Secondary | ICD-10-CM

## 2014-07-17 DIAGNOSIS — Z794 Long term (current) use of insulin: Secondary | ICD-10-CM

## 2014-07-17 DIAGNOSIS — Z9181 History of falling: Secondary | ICD-10-CM | POA: Diagnosis not present

## 2014-07-17 DIAGNOSIS — I129 Hypertensive chronic kidney disease with stage 1 through stage 4 chronic kidney disease, or unspecified chronic kidney disease: Secondary | ICD-10-CM | POA: Diagnosis present

## 2014-07-17 DIAGNOSIS — N179 Acute kidney failure, unspecified: Secondary | ICD-10-CM | POA: Diagnosis present

## 2014-07-17 DIAGNOSIS — M7989 Other specified soft tissue disorders: Secondary | ICD-10-CM

## 2014-07-17 DIAGNOSIS — Y92009 Unspecified place in unspecified non-institutional (private) residence as the place of occurrence of the external cause: Secondary | ICD-10-CM

## 2014-07-17 DIAGNOSIS — Z79899 Other long term (current) drug therapy: Secondary | ICD-10-CM | POA: Diagnosis not present

## 2014-07-17 DIAGNOSIS — I82402 Acute embolism and thrombosis of unspecified deep veins of left lower extremity: Secondary | ICD-10-CM

## 2014-07-17 DIAGNOSIS — F0390 Unspecified dementia without behavioral disturbance: Secondary | ICD-10-CM | POA: Diagnosis present

## 2014-07-17 DIAGNOSIS — M79609 Pain in unspecified limb: Secondary | ICD-10-CM

## 2014-07-17 LAB — URINE MICROSCOPIC-ADD ON

## 2014-07-17 LAB — COMPREHENSIVE METABOLIC PANEL
ALK PHOS: 106 U/L (ref 39–117)
ALT: 14 U/L (ref 0–53)
AST: 22 U/L (ref 0–37)
Albumin: 4.1 g/dL (ref 3.5–5.2)
Anion gap: 18 — ABNORMAL HIGH (ref 5–15)
BILIRUBIN TOTAL: 0.4 mg/dL (ref 0.3–1.2)
BUN: 27 mg/dL — AB (ref 6–23)
CHLORIDE: 97 meq/L (ref 96–112)
CO2: 21 mEq/L (ref 19–32)
Calcium: 10.2 mg/dL (ref 8.4–10.5)
Creatinine, Ser: 1.7 mg/dL — ABNORMAL HIGH (ref 0.50–1.35)
GFR calc non Af Amer: 35 mL/min — ABNORMAL LOW (ref >90)
GFR, EST AFRICAN AMERICAN: 40 mL/min — AB (ref >90)
GLUCOSE: 279 mg/dL — AB (ref 70–99)
Potassium: 4.8 mEq/L (ref 3.7–5.3)
SODIUM: 136 meq/L — AB (ref 137–147)
Total Protein: 8.5 g/dL — ABNORMAL HIGH (ref 6.0–8.3)

## 2014-07-17 LAB — URINALYSIS, ROUTINE W REFLEX MICROSCOPIC
Bilirubin Urine: NEGATIVE
Glucose, UA: 100 mg/dL — AB
HGB URINE DIPSTICK: NEGATIVE
KETONES UR: NEGATIVE mg/dL
Leukocytes, UA: NEGATIVE
NITRITE: NEGATIVE
Protein, ur: 100 mg/dL — AB
Specific Gravity, Urine: 1.018 (ref 1.005–1.030)
UROBILINOGEN UA: 0.2 mg/dL (ref 0.0–1.0)
pH: 5.5 (ref 5.0–8.0)

## 2014-07-17 LAB — CBC WITH DIFFERENTIAL/PLATELET
Basophils Absolute: 0 10*3/uL (ref 0.0–0.1)
Basophils Relative: 0 % (ref 0–1)
Eosinophils Absolute: 0.1 10*3/uL (ref 0.0–0.7)
Eosinophils Relative: 1 % (ref 0–5)
HCT: 40.9 % (ref 39.0–52.0)
HEMOGLOBIN: 12.7 g/dL — AB (ref 13.0–17.0)
LYMPHS PCT: 12 % (ref 12–46)
Lymphs Abs: 1.4 10*3/uL (ref 0.7–4.0)
MCH: 27.6 pg (ref 26.0–34.0)
MCHC: 31.1 g/dL (ref 30.0–36.0)
MCV: 88.9 fL (ref 78.0–100.0)
MONOS PCT: 9 % (ref 3–12)
Monocytes Absolute: 1 10*3/uL (ref 0.1–1.0)
NEUTROS ABS: 9.1 10*3/uL — AB (ref 1.7–7.7)
Neutrophils Relative %: 78 % — ABNORMAL HIGH (ref 43–77)
Platelets: 190 10*3/uL (ref 150–400)
RBC: 4.6 MIL/uL (ref 4.22–5.81)
RDW: 13.8 % (ref 11.5–15.5)
WBC: 11.6 10*3/uL — AB (ref 4.0–10.5)

## 2014-07-17 LAB — GLUCOSE, CAPILLARY
Glucose-Capillary: 268 mg/dL — ABNORMAL HIGH (ref 70–99)
Glucose-Capillary: 265 mg/dL — ABNORMAL HIGH (ref 70–99)

## 2014-07-17 LAB — I-STAT TROPONIN, ED: Troponin i, poc: 0 ng/mL (ref 0.00–0.08)

## 2014-07-17 LAB — HEPARIN LEVEL (UNFRACTIONATED): Heparin Unfractionated: 0.76 IU/mL — ABNORMAL HIGH (ref 0.30–0.70)

## 2014-07-17 MED ORDER — INFLUENZA VAC SPLIT QUAD 0.5 ML IM SUSY
0.5000 mL | PREFILLED_SYRINGE | INTRAMUSCULAR | Status: AC
  Administered 2014-07-18: 0.5 mL via INTRAMUSCULAR
  Filled 2014-07-17: qty 0.5

## 2014-07-17 MED ORDER — ONDANSETRON HCL 4 MG PO TABS: 4.0000 mg | ORAL_TABLET | Freq: Four times a day (QID) | ORAL | Status: DC | PRN

## 2014-07-17 MED ORDER — SODIUM CHLORIDE 0.9 % IV SOLN
INTRAVENOUS | Status: DC
  Administered 2014-07-17 – 2014-07-19 (×4): via INTRAVENOUS

## 2014-07-17 MED ORDER — TECHNETIUM TO 99M ALBUMIN AGGREGATED
6.0000 | Freq: Once | INTRAVENOUS | Status: AC | PRN
  Administered 2014-07-17: 6 via INTRAVENOUS

## 2014-07-17 MED ORDER — ACETAMINOPHEN 325 MG PO TABS: 650.0000 mg | ORAL_TABLET | Freq: Four times a day (QID) | ORAL | Status: DC | PRN

## 2014-07-17 MED ORDER — SODIUM CHLORIDE 0.9 % IV SOLN
INTRAVENOUS | Status: DC
  Administered 2014-07-17: 10:00:00 via INTRAVENOUS

## 2014-07-17 MED ORDER — SODIUM CHLORIDE 0.9 % IJ SOLN: 3.0000 mL | Freq: Two times a day (BID) | INTRAMUSCULAR | Status: DC

## 2014-07-17 MED ORDER — INSULIN GLARGINE 100 UNIT/ML ~~LOC~~ SOLN
10.0000 [IU] | Freq: Every day | SUBCUTANEOUS | Status: DC
  Administered 2014-07-17: 10 [IU] via SUBCUTANEOUS
  Filled 2014-07-17 (×2): qty 0.1

## 2014-07-17 MED ORDER — INSULIN ASPART 100 UNIT/ML ~~LOC~~ SOLN
0.0000 [IU] | Freq: Three times a day (TID) | SUBCUTANEOUS | Status: DC
  Administered 2014-07-17 – 2014-07-18 (×3): 5 [IU] via SUBCUTANEOUS
  Administered 2014-07-18: 3 [IU] via SUBCUTANEOUS
  Administered 2014-07-19: 7 [IU] via SUBCUTANEOUS
  Administered 2014-07-19 – 2014-07-20 (×3): 3 [IU] via SUBCUTANEOUS

## 2014-07-17 MED ORDER — HEPARIN BOLUS VIA INFUSION
3500.0000 [IU] | Freq: Once | INTRAVENOUS | Status: AC
Start: 2014-07-17 — End: 2014-07-17
  Administered 2014-07-17: 3500 [IU] via INTRAVENOUS
  Filled 2014-07-17: qty 3500

## 2014-07-17 MED ORDER — HEPARIN (PORCINE) IN NACL 100-0.45 UNIT/ML-% IJ SOLN
1000.0000 [IU]/h | INTRAMUSCULAR | Status: AC
  Administered 2014-07-17: 1100 [IU]/h via INTRAVENOUS
  Administered 2014-07-18: 1000 [IU]/h via INTRAVENOUS
  Filled 2014-07-17 (×2): qty 250

## 2014-07-17 MED ORDER — HYDROCODONE-ACETAMINOPHEN 5-325 MG PO TABS
1.0000 | ORAL_TABLET | ORAL | Status: DC | PRN
  Administered 2014-07-17 – 2014-07-18 (×2): 2 via ORAL
  Administered 2014-07-18: 1 via ORAL
  Filled 2014-07-17: qty 2
  Filled 2014-07-17: qty 1
  Filled 2014-07-17: qty 2

## 2014-07-17 MED ORDER — ACETAMINOPHEN 650 MG RE SUPP
650.0000 mg | Freq: Four times a day (QID) | RECTAL | Status: DC | PRN
Start: 2014-07-17 — End: 2014-07-20
  Filled 2014-07-17: qty 1

## 2014-07-17 MED ORDER — TECHNETIUM TC 99M DIETHYLENETRIAME-PENTAACETIC ACID: 40.0000 | Freq: Once | INTRAVENOUS | Status: AC | PRN

## 2014-07-17 MED ORDER — MORPHINE SULFATE 2 MG/ML IJ SOLN: 1.0000 mg | INTRAMUSCULAR | Status: DC | PRN

## 2014-07-17 MED ORDER — ONDANSETRON HCL 4 MG/2ML IJ SOLN: 4.0000 mg | Freq: Four times a day (QID) | INTRAMUSCULAR | Status: DC | PRN

## 2014-07-17 NOTE — Progress Notes (Signed)
ANTICOAGULATION CONSULT NOTE - Initial Consult  Pharmacy Consult for heparin Indication: DVT  Allergies  Allergen Reactions  . Aspirin Other (See Comments)    Unknown. Pt does not remember  . Flexeril [Cyclobenzaprine] Other (See Comments)    confused  . Ibuprofen Other (See Comments)    "if I take it for awhile it has tendency to make me nervous"    Patient Measurements: Height: 5\' 9"  (175.3 cm) Weight: 150 lb (68.04 kg) IBW/kg (Calculated) : 70.7 Heparin Dosing Weight: 68kg  Vital Signs: Temp: 98.4 F (36.9 C) (10/31 1204) Temp Source: Oral (10/31 1204) BP: 137/94 mmHg (10/31 1245) Pulse Rate: 84 (10/31 1245)  Labs:  Recent Labs  07/17/14 0949  HGB 12.7*  HCT 40.9  PLT 190  CREATININE 1.70*    Estimated Creatinine Clearance: 30 ml/min (by C-G formula based on Cr of 1.7).   Medical History: Past Medical History  Diagnosis Date  . Dementia   . Hypertension   . Gout   . Hyperlipidemia   . Fracture of lumbar spine 11-08-11  . Coronary artery disease   . Myocardial infarction 2004    NSTEMI/E-chart  . Chronic kidney disease (CKD), stage II (mild)     /E-chart  . Prostate cancer     S/P radiation  . Osteoarthritis     /E-chart  . GI bleeding 2004; 01/2011    /E-chart  . Blood transfusion   . Anemia   . Diverticulosis   . Type II diabetes mellitus   . GERD (gastroesophageal reflux disease)   . Dysrhythmia 05/01/2012    bradycardia    Medications:  Infusions:  . sodium chloride 125 mL/hr at 07/17/14 0951  . heparin    . heparin      Assessment: 76 yom presented to the ED with weakness and decreased appetite. Found to have a new LLE DVT. To start heparin for anticoagulation. Baseline H/H and plts are WNL. He is not on anticoagulation PTA.   Goal of Therapy:  Heparin level 0.3-0.7 units/ml Monitor platelets by anticoagulation protocol: Yes   Plan:  1. Heparin bolus 3500 units IV x 1 2. Heparin inf 1100 units/hr 3. Check an 8 hour HL 4.  Daily HL and CBC 5. F/u oral AC plan  Laneka Mcgrory, Rande Lawman 07/17/2014,1:52 PM

## 2014-07-17 NOTE — H&P (Addendum)
[facility] History and Physical  [name] QBH:[id number] DOB: 1926-07-13 DOA: 07/17/2014  Referring physician: Carlisle Cater, PA-C PCP: Lilian Coma, MD   Chief Complaint: Syncope  HPI: [name] is a 80 y.o. male with past medical history of diabetes, HTN and history of prostate cancer came to the hospital because of syncope. Most of the history obtained from patient daughter [name]) at bedside. Patient for the past 2 month was showing signs of failure to thrive with generalized weakness, not able to eat or drink appropriately. This morning he syncopized in the bathroom while he was sitting in the tablet, he was found by his daughter after he spent about an hour in the bathroom. In the ED he was found to have acute extensive DVT involving the left lower extremity, his CT scan of the head was negative, and has creatinine of 1.7.  Review of Systems:  Obtained from the daughter at bedside. Loss of appetite, early satiety, generalized weakness and frequent falls.  Past Medical History  Diagnosis Date  . Dementia   . Hypertension   . Gout   . Hyperlipidemia   . Fracture of lumbar spine 11-08-11  . Coronary artery disease   . Myocardial infarction 2004    NSTEMI/E-chart  . Chronic kidney disease (CKD), stage II (mild)     /E-chart  . Prostate cancer     S/P radiation  . Osteoarthritis     /E-chart  . GI bleeding 2004; 01/2011    /E-chart  . Blood transfusion   . Anemia   . Diverticulosis   . Type II diabetes mellitus   . GERD (gastroesophageal reflux disease)   . Dysrhythmia 05/01/2012    bradycardia   Past Surgical History  Procedure Laterality Date  . Left leg surgery  1950's    "injured in [place] war"  . Right shoulder surgery    . Left cataract extraction    . Tonsillectomy     Social History:   reports that he has quit smoking. His smoking use included Cigarettes. He has a 35 pack-year smoking history. He has never used smokeless tobacco. He  reports that he does not drink alcohol or use illicit drugs.  Allergies  Allergen Reactions  . Aspirin Other (See Comments)    Unknown. Pt does not remember  . Flexeril [Cyclobenzaprine] Other (See Comments)    confused  . Ibuprofen Other (See Comments)    "if I take it for awhile it has tendency to make me nervous"    History reviewed. No pertinent family history.   Prior to Admission medications   Medication Sig Start Date End Date Taking? Authorizing Provider  allopurinol (ZYLOPRIM) 300 MG tablet Take 300 mg by mouth daily.    Historical Provider, MD  amoxicillin (AMOXIL) 875 MG tablet Take 875 mg by mouth 2 (two) times daily.    Historical Provider, MD  docusate sodium (COLACE) 100 MG capsule Take 100 mg by mouth 2 (two) times daily.    Historical Provider, MD  Fe Fum-FePoly-Vit C-Vit B3 (INTEGRA) 62.5-62.5-40-3 MG CAPS Take 1 capsule by mouth daily.    Historical Provider, MD  furosemide (LASIX) 40 MG tablet TAKE 1 TABLET BY MOUTH EVERY DAY 06/23/14   Sinclair Grooms, MD  HYDROcodone-acetaminophen (NORCO/VICODIN) 5-325 MG per tablet Before sleep as needed for pain 08/31/13   Tanna Furry, MD  hydrOXYzine (ATARAX/VISTARIL) 25 MG tablet Take 25 mg by mouth at bedtime as needed for itching.    Historical Provider,  MD  insulin aspart (NOVOLOG) 100 UNIT/ML injection Inject 10 Units into the skin 3 (three) times daily before meals. *doesn't take if sugar is below 70    Historical Provider, MD  insulin glargine (LANTUS) 100 UNIT/ML injection Inject 20 Units into the skin at bedtime.     Historical Provider, MD  metoprolol tartrate (LOPRESSOR) 25 MG tablet TAKE 1/2 TABLET BY MOUTH TWICE DAILY 04/23/14   Belva Crome III, MD  mirtazapine (REMERON) 15 MG tablet Take 15 mg by mouth at bedtime.    Historical Provider, MD  omeprazole (PRILOSEC) 20 MG capsule Take 20 mg by mouth daily.    Historical Provider, MD  ondansetron (ZOFRAN ODT) 4 MG disintegrating tablet Take 1 tablet (4 mg total) by mouth  every 8 (eight) hours as needed for nausea or vomiting. 10/02/13   Deno Etienne, MD  sertraline (ZOLOFT) 50 MG tablet Take 50 mg by mouth daily.    Historical Provider, MD  simvastatin (ZOCOR) 20 MG tablet Take 20 mg by mouth at bedtime.    Historical Provider, MD  tamsulosin (FLOMAX) 0.4 MG CAPS capsule Take 0.4 mg by mouth daily after breakfast.    Historical Provider, MD   Physical Exam: Filed Vitals:   07/17/14 1245  BP: 137/94  Pulse: 84  Temp:   Resp: 14   Constitutional: Oriented to person, place, and time. Well-developed and well-nourished. Cooperative.  Head: Normocephalic and atraumatic.  Nose: Nose normal.  Mouth/Throat: Uvula is midline, oropharynx is clear and moist and mucous membranes are normal.  Eyes: Conjunctivae and EOM are normal. Pupils are equal, round, and reactive to light.  Neck: Trachea normal and normal range of motion. Neck supple.  Cardiovascular: Normal rate, regular rhythm, S1 normal, S2 normal, normal heart sounds and intact distal pulses.   Pulmonary/Chest: Effort normal and breath sounds normal.  Abdominal: Soft. Bowel sounds are normal. There is no hepatosplenomegaly. There is mild generalized tenderness.  Musculoskeletal: Normal range of motion.  Neurological: Alert and oriented to person, place, and time. Has normal strength. No cranial nerve deficit or sensory deficit.  Skin: Skin is warm, dry and intact.  Psychiatric: Has a normal mood and affect. Speech is normal and behavior is normal.   Labs on Admission:  Basic Metabolic Panel:  Recent Labs Lab 07/17/14 0949  NA 136*  K 4.8  CL 97  CO2 21  GLUCOSE 279*  BUN 27*  CREATININE 1.70*  CALCIUM 10.2   Liver Function Tests:  Recent Labs Lab 07/17/14 0949  AST 22  ALT 14  ALKPHOS 106  BILITOT 0.4  PROT 8.5*  ALBUMIN 4.1   No results found for this basename: LIPASE, AMYLASE,  in the last 168 hours No results found for this basename: AMMONIA,  in the last 168 hours CBC:  Recent  Labs Lab 07/17/14 0949  WBC 11.6*  NEUTROABS 9.1*  HGB 12.7*  HCT 40.9  MCV 88.9  PLT 190   Cardiac Enzymes: No results found for this basename: CKTOTAL, CKMB, CKMBINDEX, TROPONINI,  in the last 168 hours  BNP (last 3 results) No results found for this basename: PROBNP,  in the last 8760 hours CBG: No results found for this basename: GLUCAP,  in the last 168 hours  Radiological Exams on Admission: Dg Chest 2 View  07/17/2014   CLINICAL DATA:  Generalized weakness.  EXAM: CHEST - 2 VIEW  COMPARISON:  10/02/2013  FINDINGS: The heart size and mediastinal contours are within normal limits. Improved aeration at the  lung bases compared to the prior study. There is no evidence of pulmonary edema, consolidation, pneumothorax, nodule or pleural fluid. The visualized skeletal structures are unremarkable.  IMPRESSION: No active disease.   Electronically Signed   By: Aletta Edouard M.D.   On: 07/17/2014 11:23   Ct Head Wo Contrast  07/17/2014   CLINICAL DATA:  Syncope, frequent falling, blurred vision and generalized weakness. History of dementia and prostate carcinoma.  EXAM: CT HEAD WITHOUT CONTRAST  TECHNIQUE: Contiguous axial images were obtained from the base of the skull through the vertex without intravenous contrast.  COMPARISON:  10/02/2013  FINDINGS: Stable cortical atrophy and periventricular small vessel disease. The brain demonstrates no evidence of hemorrhage, infarction, edema, mass effect, extra-axial fluid collection, hydrocephalus or mass lesion. The skull is unremarkable.  IMPRESSION: No acute findings.  Stable atrophy and small vessel disease.   Electronically Signed   By: Aletta Edouard M.D.   On: 07/17/2014 10:56   Chest x-ray reviewed independently, showed no acute abnormalities.  EKG: Independently reviewed, sinus rhythm with rate of 70..   Assessment/Plan Principal Problem:   Syncope Active Problems:   Diabetes mellitus type 2, uncontrolled, without complications    Essential hypertension   PROSTATE CANCER, HX OF   Fall at home   Orthostatic hypotension   Acute deep vein thrombosis (DVT) of distal vein of left lower extremity    Syncope Unclear if he lost his consciousness, he was found by his daughter sitting on the toilet but unable to move. He was awake. CT scan of the head was negative. Check 2-D echocardiogram, patient will be on telemetry. No evidence of UTI, chest x-ray clear. He does have DVT so check VQ scan to rule out PE. Check orthostatic vitals, hydrate with IV fluids  Acute DVT Per report acute occlusive DVT noted in posterior tibial, peroneal, popliteal, femoral, common femoral and profunda veins. No recent history of travel, [place] were in the beach in [place]. She Has history of prostate cancer, check PSA, start patient on heparin drip.   Failure to thrive Patient has generalized weakness, a low satiety and loss of appetite. Has history of treated prostate cancer, check PSA, check age-appropriate cancer screening CEA and CA-19-9. Await the kidney to recover to obtain CT with contrast.  Acute kidney injury Presumed to be acute, as patient dehydrated and poor oral intake. (Creatinine was 1.78 in January 2015) Hydrate with IV fluids, check BMP in a.m.  Essential hypertension Hold home medications.  Diabetes mellitus type 2 Check hemoglobin A1c, patient is on 20 units of Lantus, give only 10 minutes as oral intake is poor. Hemoglobin A1c be checked, insulin sliding scale.  Code Status: Full code Family Communication: Plan discussed with the patient in the presence of his daughter [name] at bedside Disposition Plan: Telemetry  Time spent: 70 minutes  [facility] Pager [phone]

## 2014-07-17 NOTE — ED Notes (Signed)
C/o general weakness for about 1 week and decreased appetite and intake for 3 weeks denies any pain or discomfort

## 2014-07-17 NOTE — ED Provider Notes (Signed)
Medical screening examination/treatment/procedure(s) were conducted as a shared visit with non-physician practitioner(s) and myself.  I personally evaluated the patient during the encounter.   EKG Interpretation   Date/Time:  Saturday July 17 2014 09:19:31 EDT Ventricular Rate:  70 PR Interval:  154 QRS Duration: 79 QT Interval:  435 QTC Calculation: 469 R Axis:   -24 Text Interpretation:  Sinus rhythm Borderline left axis deviation  Nonspecific T abnormalities, lateral leads Baseline wander in lead(s) V2  No significant change since last tracing Confirmed by [facility]  MD, Keniesha Adderly  [phone]) on 07/17/2014 9:54:30 AM     Pt seen and evaluated, pt awake and alert.  NAD.  Normal respiratory effort.  Pt has had mutliple episodes that sound most like near syncope.  Pt has evidence of extensive DVT as well.  Pt started on heparin drip, [facility].    Threasa Beards, MD 07/17/14 [phone]

## 2014-07-17 NOTE — ED Notes (Signed)
Pt to u/s

## 2014-07-17 NOTE — ED Provider Notes (Signed)
CSN: [id number]     Arrival date & time 07/17/14  0910 History   First MD Initiated Contact with Patient 07/17/14 [phone]     Chief Complaint  Patient presents with  . Fatigue     (Consider location/radiation/quality/duration/timing/severity/associated sxs/prior Treatment) HPI Comments: Pt with history of dementia, DM, CKD, NSTEMI, prostate CA, GI bleed -- presents with weakness and decreased PO intake x 3 weeks. Patient reports feeling nauseous and chilly this AM but denies other complaint. Daughter states that patient has had 3 episodes of decreased responsiveness/syncope over the past week, last episode this morning. Patient was in the bathroom and did not answer when his name was called, found slumped over and diaphoretic on toilet. He was in the bathroom for 30-45 minutes before being found. EMS was called and his initial BP was 70 systolic per family. Note admit in 2013 for bradycardia, similar presentation as today. They also note that he has not been hungry for the past 3 weeks, not eating or drinking much of anything, lost approximately 20 pounds.   The history is provided by the patient, medical records and a relative.    Past Medical History  Diagnosis Date  . Dementia   . Hypertension   . Gout   . Hyperlipidemia   . Fracture of lumbar spine 11-08-11  . Coronary artery disease   . Myocardial infarction 2004    NSTEMI/E-chart  . Chronic kidney disease (CKD), stage II (mild)     /E-chart  . Prostate cancer     S/P radiation  . Osteoarthritis     /E-chart  . GI bleeding 2004; 01/2011    /E-chart  . Blood transfusion   . Anemia   . Diverticulosis   . Type II diabetes mellitus   . GERD (gastroesophageal reflux disease)   . Dysrhythmia 05/01/2012    bradycardia   Past Surgical History  Procedure Laterality Date  . Left leg surgery  1950's    "injured in [place] war"  . Right shoulder surgery    . Left cataract extraction    . Tonsillectomy     History reviewed. No  pertinent family history. History  Substance Use Topics  . Smoking status: Former Smoker -- 1.00 packs/day for 35 years    Types: Cigarettes  . Smokeless tobacco: Never Used     Comment: 11/16/11 "quit smoking 20-30 yr ago"  . Alcohol Use: No    Review of Systems  Constitutional: Positive for appetite change and unexpected weight change. Negative for fever.  HENT: Negative for rhinorrhea and sore throat.   Eyes: Negative for redness.  Respiratory: Positive for cough (per family). Negative for shortness of breath.   Cardiovascular: Negative for chest pain.  Gastrointestinal: Positive for abdominal pain. Negative for nausea, vomiting and diarrhea.  Genitourinary: Negative for dysuria.  Musculoskeletal: Negative for myalgias.  Skin: Negative for rash.  Neurological: Positive for syncope. Negative for headaches.      Allergies  Aspirin; Flexeril; and Ibuprofen  Home Medications   Prior to Admission medications   Medication Sig Start Date End Date Taking? Authorizing Provider  allopurinol (ZYLOPRIM) 300 MG tablet Take 300 mg by mouth daily.    Historical Provider, MD  amoxicillin (AMOXIL) 875 MG tablet Take 875 mg by mouth 2 (two) times daily.    Historical Provider, MD  docusate sodium (COLACE) 100 MG capsule Take 100 mg by mouth 2 (two) times daily.    Historical Provider, MD  Fe Fum-FePoly-Vit C-Vit B3 (INTEGRA) 62.5-62.5-40-3 MG  CAPS Take 1 capsule by mouth daily.    Historical Provider, MD  furosemide (LASIX) 40 MG tablet TAKE 1 TABLET BY MOUTH EVERY DAY 06/23/14   Sinclair Grooms, MD  HYDROcodone-acetaminophen (NORCO/VICODIN) 5-325 MG per tablet Before sleep as needed for pain 08/31/13   Tanna Furry, MD  hydrOXYzine (ATARAX/VISTARIL) 25 MG tablet Take 25 mg by mouth at bedtime as needed for itching.    Historical Provider, MD  insulin aspart (NOVOLOG) 100 UNIT/ML injection Inject 10 Units into the skin 3 (three) times daily before meals. *doesn't take if sugar is below 70     Historical Provider, MD  insulin glargine (LANTUS) 100 UNIT/ML injection Inject 20 Units into the skin at bedtime.     Historical Provider, MD  metoprolol tartrate (LOPRESSOR) 25 MG tablet TAKE 1/2 TABLET BY MOUTH TWICE DAILY 04/23/14   Belva Crome III, MD  mirtazapine (REMERON) 15 MG tablet Take 15 mg by mouth at bedtime.    Historical Provider, MD  omeprazole (PRILOSEC) 20 MG capsule Take 20 mg by mouth daily.    Historical Provider, MD  ondansetron (ZOFRAN ODT) 4 MG disintegrating tablet Take 1 tablet (4 mg total) by mouth every 8 (eight) hours as needed for nausea or vomiting. 10/02/13   Deno Etienne, MD  sertraline (ZOLOFT) 50 MG tablet Take 50 mg by mouth daily.    Historical Provider, MD  simvastatin (ZOCOR) 20 MG tablet Take 20 mg by mouth at bedtime.    Historical Provider, MD  tamsulosin (FLOMAX) 0.4 MG CAPS capsule Take 0.4 mg by mouth daily after breakfast.    Historical Provider, MD   BP 132/67  Pulse 71  Temp(Src) 98.4 F (36.9 C) (Oral)  Resp 17  Ht 5\' 9"  (1.753 m)  Wt 150 lb (68.04 kg)  BMI 22.14 kg/m2  SpO2 99%  Physical Exam  Nursing note and vitals reviewed. Constitutional: He appears well-developed and well-nourished.  HENT:  Head: Normocephalic and atraumatic.  Mouth/Throat: Oropharynx is clear and moist.  Eyes: Conjunctivae are normal. Right eye exhibits no discharge. Left eye exhibits no discharge.  Neck: Normal range of motion. Neck supple.  Cardiovascular: Normal rate, regular rhythm and normal heart sounds.   Pulmonary/Chest: Effort normal and breath sounds normal.  Abdominal: Soft. There is no tenderness.  Neurological: He is alert. He has normal strength. No cranial nerve deficit or sensory deficit. He exhibits normal muscle tone. GCS eye subscore is 4. GCS verbal subscore is 5. GCS motor subscore is 6.  Skin: Skin is warm and dry.  Skin is flaking bilateral lower extremities, mild erythema and trace swelling L LE compared to R. No tenderness to palpation  except of L knee.   Psychiatric: He has a normal mood and affect.    ED Course  Procedures (including critical care time) Labs Review Labs Reviewed  CBC WITH DIFFERENTIAL - Abnormal; Notable for the following:    WBC 11.6 (*)    Hemoglobin 12.7 (*)    Neutrophils Relative % 78 (*)    Neutro Abs 9.1 (*)    All other components within normal limits  COMPREHENSIVE METABOLIC PANEL - Abnormal; Notable for the following:    Sodium 136 (*)    Glucose, Bld 279 (*)    BUN 27 (*)    Creatinine, Ser 1.70 (*)    Total Protein 8.5 (*)    GFR calc non Af Amer 35 (*)    GFR calc Af Amer 40 (*)    Anion  gap 18 (*)    All other components within normal limits  URINALYSIS, ROUTINE W REFLEX MICROSCOPIC - Abnormal; Notable for the following:    Glucose, UA 100 (*)    Protein, ur 100 (*)    All other components within normal limits  URINE MICROSCOPIC-ADD ON - Abnormal; Notable for the following:    Squamous Epithelial / LPF FEW (*)    All other components within normal limits  I-STAT TROPOININ, ED    Imaging Review Dg Chest 2 View  07/17/2014   CLINICAL DATA:  Generalized weakness.  EXAM: CHEST - 2 VIEW  COMPARISON:  10/02/2013  FINDINGS: The heart size and mediastinal contours are within normal limits. Improved aeration at the lung bases compared to the prior study. There is no evidence of pulmonary edema, consolidation, pneumothorax, nodule or pleural fluid. The visualized skeletal structures are unremarkable.  IMPRESSION: No active disease.   Electronically Signed   By: Aletta Edouard M.D.   On: 07/17/2014 11:23   Ct Head Wo Contrast  07/17/2014   CLINICAL DATA:  Syncope, frequent falling, blurred vision and generalized weakness. History of dementia and prostate carcinoma.  EXAM: CT HEAD WITHOUT CONTRAST  TECHNIQUE: Contiguous axial images were obtained from the base of the skull through the vertex without intravenous contrast.  COMPARISON:  10/02/2013  FINDINGS: Stable cortical atrophy and  periventricular small vessel disease. The brain demonstrates no evidence of hemorrhage, infarction, edema, mass effect, extra-axial fluid collection, hydrocephalus or mass lesion. The skull is unremarkable.  IMPRESSION: No acute findings.  Stable atrophy and small vessel disease.   Electronically Signed   By: Aletta Edouard M.D.   On: 07/17/2014 10:56     EKG Interpretation   Date/Time:  Saturday July 17 2014 09:19:31 EDT Ventricular Rate:  70 PR Interval:  154 QRS Duration: 79 QT Interval:  435 QTC Calculation: 469 R Axis:   -24 Text Interpretation:  Sinus rhythm Borderline left axis deviation  Nonspecific T abnormalities, lateral leads Baseline wander in lead(s) V2  No significant change since last tracing Confirmed by [facility]  MD, MARTHA  [phone]) on 07/17/2014 9:54:30 AM      9:45 AM Patient seen and examined. Work-up initiated. Limited history at this time -- family not yet at bedside.    Vital signs reviewed and are as follows: BP 132/67  Pulse 71  Temp(Src) 98.4 F (36.9 C) (Oral)  Resp 17  Ht 5\' 9"  (1.753 m)  Wt 150 lb (68.04 kg)  BMI 22.14 kg/m2  SpO2 99%  10:04 AM EKG reviewed, unchanged from January.   1:27 PM Extensive DVT L LE. Pt seen by and discussed with Dr. Canary Brim.   1:37 PM Hospitalist to admit. Heparin ordered.   MDM   Final diagnoses:  Weakness generalized  Syncope  DVT (deep venous thrombosis), left  Failure to thrive in adult   Admit.     Carlisle Cater, PA-C 07/17/14 [phone]

## 2014-07-17 NOTE — ED Notes (Signed)
pts daughter called and said that this am pt was in bathroom and did not respond when she called him so daughter went into br and found pt "slumped over and sweating" and decreased responsiveness provider informed

## 2014-07-17 NOTE — ED Notes (Signed)
Daughters at bedside pt drinking water attempting to give urine specimen

## 2014-07-17 NOTE — Progress Notes (Signed)
PT Cancellation Note  Patient Details Name: [name] MRN: [id number] DOB: 1926/06/30   Cancelled Treatment:    Reason Eval/Treat Not Completed: Medical issues which prohibited therapy Pt found to have acute occlusive DVT noted in posterior tibial, peroneal, popliteal, femoral, common femoral and profunda veins. Heparin drip just initiated. Will await until patient is therapeutic prior to mobilization. Will follow up on 11/1 or next available time.    Candy Sledge A 07/17/2014, 4:18 PM Candy Sledge, [place], DPT [phone]

## 2014-07-17 NOTE — ED Notes (Signed)
pts daughter [phone](Karen) and son [phone](Jacolby JR)

## 2014-07-17 NOTE — Progress Notes (Signed)
ANTICOAGULATION CONSULT NOTE - Follow Up Consult  Pharmacy Consult for Heparin  Indication: DVT, new LLE  Allergies  Allergen Reactions  . Aspirin Other (See Comments)    Unknown. Pt does not remember  . Flexeril [Cyclobenzaprine] Other (See Comments)    confused  . Ibuprofen Other (See Comments)    "if I take it for awhile it has tendency to make me nervous"    Patient Measurements: Height: 5\' 9"  (175.3 cm) Weight: 150 lb (68.04 kg) IBW/kg (Calculated) : 70.7  Vital Signs: Temp: 99 F (37.2 C) (10/31 2100) Temp Source: Oral (10/31 1523) BP: 117/80 mmHg (10/31 2100) Pulse Rate: 80 (10/31 2100)  Labs:  Recent Labs  07/17/14 0949 07/17/14 2238  HGB 12.7*  --   HCT 40.9  --   PLT 190  --   HEPARINUNFRC  --  0.76*  CREATININE 1.70*  --     Estimated Creatinine Clearance: 30 ml/min (by C-G formula based on Cr of 1.7).   Assessment: Slightly elevated heparin level. Other labs as above. No issues per RN.   Goal of Therapy:  Heparin level 0.3-0.7 units/ml Monitor platelets by anticoagulation protocol: Yes   Plan:  -Decrease heparin to 1000 units/hr -0800 HL -Daily CBC/HL -Monitor for bleeding  Narda Bonds 07/17/2014,11:31 PM

## 2014-07-17 NOTE — Progress Notes (Signed)
VASCULAR LAB PRELIMINARY  PRELIMINARY  PRELIMINARY  PRELIMINARY  Bilateral lower extremity venous Dopplers completed.    Preliminary report:  There is acute, occlusive DVT noted throughout the left lower extremity, including the posterior tibial, peroneal, popliteal, femoral, common femoral, and profunda veins.  There is no propagation noted to the right lower extremity.   Ashaunti Treptow, RVT 07/17/2014, 1:17 PM

## 2014-07-18 DIAGNOSIS — R55 Syncope and collapse: Secondary | ICD-10-CM | POA: Diagnosis not present

## 2014-07-18 DIAGNOSIS — I517 Cardiomegaly: Secondary | ICD-10-CM

## 2014-07-18 DIAGNOSIS — I951 Orthostatic hypotension: Secondary | ICD-10-CM

## 2014-07-18 LAB — BASIC METABOLIC PANEL
ANION GAP: 12 (ref 5–15)
BUN: 30 mg/dL — ABNORMAL HIGH (ref 6–23)
CALCIUM: 9.3 mg/dL (ref 8.4–10.5)
CO2: 20 meq/L (ref 19–32)
Chloride: 104 mEq/L (ref 96–112)
Creatinine, Ser: 1.68 mg/dL — ABNORMAL HIGH (ref 0.50–1.35)
GFR calc non Af Amer: 35 mL/min — ABNORMAL LOW (ref >90)
GFR, EST AFRICAN AMERICAN: 41 mL/min — AB (ref >90)
Glucose, Bld: 278 mg/dL — ABNORMAL HIGH (ref 70–99)
Potassium: 4.7 mEq/L (ref 3.7–5.3)
Sodium: 136 mEq/L — ABNORMAL LOW (ref 137–147)

## 2014-07-18 LAB — GLUCOSE, CAPILLARY
GLUCOSE-CAPILLARY: 219 mg/dL — AB (ref 70–99)
GLUCOSE-CAPILLARY: 259 mg/dL — AB (ref 70–99)
GLUCOSE-CAPILLARY: 298 mg/dL — AB (ref 70–99)
GLUCOSE-CAPILLARY: 316 mg/dL — AB (ref 70–99)

## 2014-07-18 LAB — CEA: CEA: 2.4 ng/mL (ref 0.0–5.0)

## 2014-07-18 LAB — CBC
HEMATOCRIT: 33.9 % — AB (ref 39.0–52.0)
Hemoglobin: 10.8 g/dL — ABNORMAL LOW (ref 13.0–17.0)
MCH: 27.8 pg (ref 26.0–34.0)
MCHC: 31.9 g/dL (ref 30.0–36.0)
MCV: 87.4 fL (ref 78.0–100.0)
Platelets: 167 10*3/uL (ref 150–400)
RBC: 3.88 MIL/uL — ABNORMAL LOW (ref 4.22–5.81)
RDW: 13.9 % (ref 11.5–15.5)
WBC: 12.6 10*3/uL — ABNORMAL HIGH (ref 4.0–10.5)

## 2014-07-18 LAB — HEMOGLOBIN A1C
Hgb A1c MFr Bld: 10.5 % — ABNORMAL HIGH (ref <5.7)
MEAN PLASMA GLUCOSE: 255 mg/dL — AB (ref <117)

## 2014-07-18 LAB — CANCER ANTIGEN 19-9: CA 19 9: 285.8 U/mL — AB (ref <35.0)

## 2014-07-18 MED ORDER — SIMVASTATIN 20 MG PO TABS
20.0000 mg | ORAL_TABLET | Freq: Every day | ORAL | Status: DC
  Administered 2014-07-18 – 2014-07-19 (×2): 20 mg via ORAL
  Filled 2014-07-18 (×2): qty 1

## 2014-07-18 MED ORDER — SERTRALINE HCL 50 MG PO TABS
50.0000 mg | ORAL_TABLET | Freq: Every day | ORAL | Status: DC
  Administered 2014-07-18 – 2014-07-20 (×3): 50 mg via ORAL
  Filled 2014-07-18 (×3): qty 1

## 2014-07-18 MED ORDER — MIRTAZAPINE 7.5 MG PO TABS
15.0000 mg | ORAL_TABLET | Freq: Every day | ORAL | Status: DC
  Administered 2014-07-18 – 2014-07-19 (×2): 15 mg via ORAL
  Filled 2014-07-18 (×2): qty 2

## 2014-07-18 MED ORDER — PANTOPRAZOLE SODIUM 40 MG PO TBEC
40.0000 mg | DELAYED_RELEASE_TABLET | Freq: Every day | ORAL | Status: DC
  Administered 2014-07-18 – 2014-07-20 (×3): 40 mg via ORAL
  Filled 2014-07-18 (×3): qty 1

## 2014-07-18 MED ORDER — DOCUSATE SODIUM 100 MG PO CAPS
100.0000 mg | ORAL_CAPSULE | Freq: Two times a day (BID) | ORAL | Status: DC
  Administered 2014-07-18 – 2014-07-20 (×5): 100 mg via ORAL
  Filled 2014-07-18 (×5): qty 1

## 2014-07-18 MED ORDER — RIVAROXABAN 15 MG PO TABS
15.0000 mg | ORAL_TABLET | Freq: Two times a day (BID) | ORAL | Status: DC
  Administered 2014-07-18 – 2014-07-20 (×5): 15 mg via ORAL
  Filled 2014-07-18 (×5): qty 1

## 2014-07-18 MED ORDER — ALLOPURINOL 300 MG PO TABS
300.0000 mg | ORAL_TABLET | Freq: Every day | ORAL | Status: DC
  Administered 2014-07-18 – 2014-07-20 (×3): 300 mg via ORAL
  Filled 2014-07-18 (×3): qty 1

## 2014-07-18 MED ORDER — INSULIN GLARGINE 100 UNIT/ML ~~LOC~~ SOLN
15.0000 [IU] | Freq: Every day | SUBCUTANEOUS | Status: DC
  Administered 2014-07-18: 15 [IU] via SUBCUTANEOUS
  Filled 2014-07-18 (×2): qty 0.15

## 2014-07-18 MED ORDER — RIVAROXABAN 20 MG PO TABS: 20.0000 mg | ORAL_TABLET | Freq: Every day | ORAL | Status: DC

## 2014-07-18 MED ORDER — METOPROLOL TARTRATE 12.5 MG HALF TABLET
12.5000 mg | ORAL_TABLET | Freq: Two times a day (BID) | ORAL | Status: DC
  Administered 2014-07-18 – 2014-07-20 (×5): 12.5 mg via ORAL
  Filled 2014-07-18 (×5): qty 1

## 2014-07-18 NOTE — Plan of Care (Signed)
Problem: Consults Goal: Chest Pain Patient Education (See Patient Education module for education specifics.) Outcome: Completed/Met Date Met:  07/18/14 Goal: Skin Care Protocol Initiated - if Braden Score 18 or less If consults are not indicated, leave blank or document N/A Outcome: Not Applicable Date Met:  91/50/41 Goal: Tobacco Cessation referral if indicated Outcome: Not Applicable Date Met:  36/43/83 Goal: Nutrition Consult-if indicated Outcome: Not Applicable Date Met:  77/93/96 Goal: Diabetes Guidelines if Diabetic/Glucose > 140 If diabetic or lab glucose is > 140 mg/dl - Initiate Diabetes/Hyperglycemia Guidelines & Document Interventions  Outcome: Not Applicable Date Met:  88/64/84

## 2014-07-18 NOTE — Progress Notes (Signed)
ANTICOAGULATION CONSULT NOTE - Initial Consult  Pharmacy Consult for Heparin>>Xarelto  Indication: DVT  Allergies  Allergen Reactions  . Aspirin Other (See Comments)    Unknown. Pt does not remember  . Flexeril [Cyclobenzaprine] Other (See Comments)    confused  . Ibuprofen Other (See Comments)    "if I take it for awhile it has tendency to make me nervous"    Patient Measurements: Height: 5\' 9"  (175.3 cm) Weight: 151 lb 8 oz (68.72 kg) IBW/kg (Calculated) : 70.7  Vital Signs: Temp: 98.3 F (36.8 C) (11/01 0500) BP: 124/85 mmHg (11/01 0500) Pulse Rate: 75 (11/01 0500)  Labs:  Recent Labs  07/17/14 0949 07/17/14 2238 07/18/14 0412  HGB 12.7*  --  10.8*  HCT 40.9  --  33.9*  PLT 190  --  167  HEPARINUNFRC  --  0.76*  --   CREATININE 1.70*  --  1.68*    Estimated Creatinine Clearance: 30.7 mL/min (by C-G formula based on Cr of 1.68).   Medical History: Past Medical History  Diagnosis Date  . Dementia   . Hypertension   . Gout   . Hyperlipidemia   . Fracture of lumbar spine 11-08-11  . Coronary artery disease   . Myocardial infarction 2004    NSTEMI/E-chart  . Chronic kidney disease (CKD), stage II (mild)     /E-chart  . Prostate cancer     S/P radiation  . Osteoarthritis     /E-chart  . GI bleeding 2004; 01/2011    /E-chart  . Blood transfusion   . Anemia   . Diverticulosis   . Type II diabetes mellitus   . GERD (gastroesophageal reflux disease)   . Dysrhythmia 05/01/2012    bradycardia   Assessment: 80 y/o M with new LLE DVT, currently on heparin and now transitioning to Xarelto, pt currently meets CrCl criteria but would need an alternative agent if renal function worsens.   Goal of Therapy:  Monitor platelets by anticoagulation protocol: Yes   Plan:  -Xarelto 15 mg BID with meals for 21 days, then 20 mg daily with supper -DC heparin after Xarelto is given -Minimum q72h CBC while inpatient -Monitor for bleeding -F/U renal function    Narda Bonds 07/18/2014,7:15 AM

## 2014-07-18 NOTE — Progress Notes (Signed)
[facility] Progress Note   [name] AST:[id number] DOB: Oct 11, 1925 DOA: 07/17/2014 PCP: Lilian Coma, MD  Brief narrative: [name] is a 80 y.o. male with h/o DM 2, HTN, prostate cancer presenting with syncope and h/o failure to thrive over the past 2 months. He passed out while sitting on the commode and was brought to the hospital.    Subjective: He complains of having left leg pain which was the reason for coming to the hospital. He does not recall passing out.   Assessment/Plan: Principal Problem:   Syncope - history is unclear in regards to this - CT head negative - f/u on ECHO and monitor on tele for 24 hrs.  - V/Q negative, not hypoxic - noted to be orthostatic on vital signs per Heart rate - hydrate and follow orthostatics- hold Flomax for now  Active Problems:  ARF on CKD -likely prerenal due to poor PO intake and use of Lasix - cont to hydrate- holding Lasix  DVT- extensive, left leg - started on Xarelto today - patient admits to very poor mobility over the past few weeks with a good amount of time spent in a chair     Diabetes mellitus type 2, uncontrolled, without complications - Lantus decreased to 10 U on admission due to h/o poor PO intake - sugars elevated- will increase Lantus to 15 U and follow on current low dose sliding scale    Essential hypertension - home meds on hold  - BP elevated- resume Lopressor    PROSTATE CANCER, HX OF - patient is not aware of having prostate cancer (?)    Code Status: Full code Family Communication: none Disposition Plan: to be determined based upon PT eval DVT prophylaxis: Xarelto  Consultants: None  Procedures: none  Antibiotics: Anti-infectives    None         Objective: Filed Weights   07/17/14 0923 07/18/14 0500  Weight: 68.04 kg (150 lb) 68.72 kg (151 lb 8 oz)    Intake/Output Summary (Last 24 hours) at 07/18/14 1300 Last data filed at 07/18/14 1000  Gross per 24  hour  Intake   1485 ml  Output      0 ml  Net   1485 ml     Vitals Filed Vitals:   07/17/14 1415 07/17/14 1523 07/17/14 2100 07/18/14 0500  BP: 153/84 179/79 117/80 124/85  Pulse: 86 86 80 75  Temp:  99 F (37.2 C) 99 F (37.2 C) 98.3 F (36.8 C)  TempSrc:  Oral    Resp: 17 18 16 18   Height:      Weight:    68.72 kg (151 lb 8 oz)  SpO2: 98% 99% 100% 99%    Exam: General: No acute respiratory distress Lungs: Clear to auscultation bilaterally without wheezes or crackles Cardiovascular: Regular rate and rhythm without murmur gallop or rub normal S1 and S2 Abdomen: Nontender, nondistended, soft, bowel sounds positive, no rebound, no ascites, no appreciable mass Extremities: No significant cyanosis, clubbing +  edema left lower extremity  Data Reviewed: Basic Metabolic Panel:  Recent Labs Lab 07/17/14 0949 07/18/14 0412  NA 136* 136*  K 4.8 4.7  CL 97 104  CO2 21 20  GLUCOSE 279* 278*  BUN 27* 30*  CREATININE 1.70* 1.68*  CALCIUM 10.2 9.3   Liver Function Tests:  Recent Labs Lab 07/17/14 0949  AST 22  ALT 14  ALKPHOS 106  BILITOT 0.4  PROT 8.5*  ALBUMIN 4.1   No results  for input(s): LIPASE, AMYLASE in the last 168 hours. No results for input(s): AMMONIA in the last 168 hours. CBC:  Recent Labs Lab 07/17/14 0949 07/18/14 0412  WBC 11.6* 12.6*  NEUTROABS 9.1*  --   HGB 12.7* 10.8*  HCT 40.9 33.9*  MCV 88.9 87.4  PLT 190 167   Cardiac Enzymes: No results for input(s): CKTOTAL, CKMB, CKMBINDEX, TROPONINI in the last 168 hours. BNP (last 3 results) No results for input(s): PROBNP in the last 8760 hours. CBG:  Recent Labs Lab 07/17/14 1625 07/17/14 2118 07/18/14 0800 07/18/14 1149  GLUCAP 268* 265* 219* 259*    No results found for this or any previous visit (from the past 240 hour(s)).   Studies:  Recent x-ray studies have been reviewed in detail by the Attending Physician  Scheduled Meds:  Scheduled Meds: . insulin aspart  0-9  Units Subcutaneous TID WC  . insulin glargine  15 Units Subcutaneous QHS  . rivaroxaban  15 mg Oral BID WC   Followed by  . [START ON 08/08/2014] rivaroxaban  20 mg Oral Q supper  . sodium chloride  3 mL Intravenous Q12H   Continuous Infusions: . sodium chloride 75 mL/hr at 07/18/14 1012    Time spent on care of this patient: >35 min   [place], MD 07/18/2014, 1:00 PM  LOS: 1 day   [facility] Office  [phone] Pager - Text Page per [email or web address]  If 7PM-7AM, please contact night-coverage [email or web address]

## 2014-07-18 NOTE — Discharge Instructions (Addendum)
Information on my medicine - XARELTO (rivaroxaban)  This medication education was reviewed with me or my healthcare representative as part of my discharge preparation.  The pharmacist that spoke with me during my hospital stay was:  Reginia Naas, [place]? Xarelto was prescribed to treat blood clots that may have been found in the veins of your legs (deep vein thrombosis) or in your lungs (pulmonary embolism) and to reduce the risk of them occurring again.  What do you need to know about Xarelto? The starting dose is one 15 mg tablet taken TWICE daily with food for the FIRST 21 DAYS then on 11/22  the dose is changed to one 20 mg tablet taken ONCE A DAY with your evening meal.  DO NOT stop taking Xarelto without talking to the health care provider who prescribed the medication.  Refill your prescription for 20 mg tablets before you run out.  After discharge, you should have regular check-up appointments with your healthcare provider that is prescribing your Xarelto.  In the future your dose may need to be changed if your kidney function changes by a significant amount.  What do you do if you miss a dose? If you are taking Xarelto TWICE DAILY and you miss a dose, take it as soon as you remember. You may take two 15 mg tablets (total 30 mg) at the same time then resume your regularly scheduled 15 mg twice daily the next day.  If you are taking Xarelto ONCE DAILY and you miss a dose, take it as soon as you remember on the same day then continue your regularly scheduled once daily regimen the next day. Do not take two doses of Xarelto at the same time.   Important Safety Information Xarelto is a blood thinner medicine that can cause bleeding. You should call your healthcare provider right away if you experience any of the following: ? Bleeding from an injury or your nose that does not stop. ? Unusual colored urine (red or dark brown) or unusual  colored stools (red or black). ? Unusual bruising for unknown reasons. ? A serious fall or if you hit your head (even if there is no bleeding).  Some medicines may interact with Xarelto and might increase your risk of bleeding while on Xarelto. To help avoid this, consult your healthcare provider or pharmacist prior to using any new prescription or non-prescription medications, including herbals, vitamins, non-steroidal anti-inflammatory drugs (NSAIDs) and supplements.  This website has more information on Xarelto: [email or web address]   Cardiac Diet This diet can help prevent heart disease and stroke. Many factors influence your heart health, including eating and exercise habits. Coronary risk rises a lot with abnormal blood fat (lipid) levels. Cardiac meal planning includes limiting unhealthy fats, increasing healthy fats, and making other small dietary changes. General guidelines are as follows:  Adjust calorie intake to reach and maintain desirable body weight.  Limit total fat intake to less than 30% of total calories. Saturated fat should be less than 7% of calories.  Saturated fats are found in animal products and in some vegetable products. Saturated vegetable fats are found in coconut oil, cocoa butter, palm oil, and palm kernel oil. Read labels carefully to avoid these products as much as possible. Use butter in moderation. Choose tub margarines and oils that have 2 grams of fat or less. Good cooking oils are canola and olive oils.  Practice low-fat cooking techniques. Do not fry food. Instead, broil, bake, boil,  steam, grill, roast on a rack, stir-fry, or microwave it. Other fat reducing suggestions include:  Remove the skin from poultry.  Remove all visible fat from meats.  Skim the fat off stews, soups, and gravies before serving them.  Steam vegetables in water or broth instead of sauting them in fat.  Avoid foods with trans fat (or hydrogenated oils), such as commercially  fried foods and commercially baked goods. Commercial shortening and deep-frying fats will contain trans fat.  Increase intake of fruits, vegetables, whole grains, and legumes to replace foods high in fat.  Increase consumption of nuts, legumes, and seeds to at least 4 servings weekly. One serving of a legume equals  cup, and 1 serving of nuts or seeds equals  cup.  Choose whole grains more often. Have 3 servings per day (a serving is 1 ounce [oz]).  Eat 4 to 5 servings of vegetables per day. A serving of vegetables is 1 cup of raw leafy vegetables;  cup of raw or cooked cut-up vegetables;  cup of vegetable juice.  Eat 4 to 5 servings of fruit per day. A serving of fruit is 1 medium whole fruit;  cup of dried fruit;  cup of fresh, frozen, or canned fruit;  cup of 100% fruit juice.  Increase your intake of dietary fiber to 20 to 30 grams per day. Insoluble fiber may help lower your risk of heart disease and may help curb your appetite.  Soluble fiber binds cholesterol to be removed from the blood. Foods high in soluble fiber are dried beans, citrus fruits, oats, apples, bananas, broccoli, Brussels sprouts, and eggplant.  Try to include foods fortified with plant sterols or stanols, such as yogurt, breads, juices, or margarines. Choose several fortified foods to achieve a daily intake of 2 to 3 grams of plant sterols or stanols.  Foods with omega-3 fats can help reduce your risk of heart disease. Aim to have a 3.5 oz portion of fatty fish twice per week, such as salmon, mackerel, albacore tuna, sardines, lake trout, or herring. If you wish to take a fish oil supplement, choose one that contains 1 gram of both DHA and EPA.  Limit processed meats to 2 servings (3 oz portion) weekly.  Limit the sodium in your diet to 1500 milligrams (mg) per day. If you have high blood pressure, talk to a registered dietitian about a DASH (Dietary Approaches to Stop Hypertension) eating plan.  Limit sweets  and beverages with added sugar, such as soda, to no more than 5 servings per week. One serving is:   1 tablespoon sugar.  1 tablespoon jelly or jam.   cup sorbet.  1 cup lemonade.   cup regular soda. CHOOSING FOODS Starches  Allowed: Breads: All kinds (wheat, rye, raisin, white, oatmeal, [place], [place], and English muffin bread). Low-fat rolls: English muffins, frankfurter and hamburger buns, bagels, pita bread, tortillas (not fried). Pancakes, waffles, biscuits, and muffins made with recommended oil.  Avoid: Products made with saturated or trans fats, oils, or whole milk products. Butter rolls, cheese breads, croissants. Commercial doughnuts, muffins, sweet rolls, biscuits, waffles, pancakes, store-bought mixes. Crackers  Allowed: Low-fat crackers and snacks: Animal, graham, rye, saltine (with recommended oil, no lard), oyster, and matzo crackers. Bread sticks, melba toast, rusks, flatbread, pretzels, and light popcorn.  Avoid: High-fat crackers: cheese crackers, butter crackers, and those made with coconut, palm oil, or trans fat (hydrogenated oils). Buttered popcorn. Cereals  Allowed: Hot or cold whole-grain cereals.  Avoid: Cereals containing coconut, hydrogenated  vegetable fat, or animal fat. Potatoes / Pasta / Rice  Allowed: All kinds of potatoes, rice, and pasta (such as macaroni, spaghetti, and noodles).  Avoid: Pasta or rice prepared with cream sauce or high-fat cheese. Chow mein noodles, [place] fries. Vegetables  Allowed: All vegetables and vegetable juices.  Avoid: Fried vegetables. Vegetables in cream, butter, or high-fat cheese sauces. Limit coconut. Fruit in cream or custard. Protein  Allowed: Limit your intake of meat, seafood, and poultry to no more than 6 oz (cooked weight) per day. All lean, well-trimmed beef, veal, pork, and lamb. All chicken and [place] without skin. All fish and shellfish. Wild game: wild duck, rabbit, pheasant, and venison. Egg whites  or low-cholesterol egg substitutes may be used as desired. Meatless dishes: recipes with dried beans, peas, lentils, and tofu (soybean curd). Seeds and nuts: all seeds and most nuts.  Avoid: Prime grade and other heavily marbled and fatty meats, such as short ribs, spare ribs, rib eye roast or steak, frankfurters, sausage, bacon, and high-fat luncheon meats, mutton. Caviar. Commercially fried fish. Domestic duck, goose, venison sausage. Organ meats: liver, gizzard, heart, chitterlings, brains, kidney, sweetbreads. Dairy  Allowed: Low-fat cheeses: nonfat or low-fat cottage cheese (1% or 2% fat), cheeses made with part skim milk, such as mozzarella, farmers, string, or ricotta. (Cheeses should be labeled no more than 2 to 6 grams fat per oz.). Skim (or 1%) milk: liquid, powdered, or evaporated. Buttermilk made with low-fat milk. Drinks made with skim or low-fat milk or cocoa. Chocolate milk or cocoa made with skim or low-fat (1%) milk. Nonfat or low-fat yogurt.  Avoid: Whole milk cheeses, including col[place], cheddar, [place], Monterey Jack, [place], [address], [place], American, Swiss, and blue. Creamed cottage cheese, cream cheese. Whole milk and whole milk products, including buttermilk or yogurt made from whole milk, drinks made from whole milk. Condensed milk, evaporated whole milk, and 2% milk. Soups and Combination Foods  Allowed: Low-fat low-sodium soups: broth, dehydrated soups, homemade broth, soups with the fat removed, homemade cream soups made with skim or low-fat milk. Low-fat spaghetti, lasagna, chili, and Spanish rice if low-fat ingredients and low-fat cooking techniques are used.  Avoid: Cream soups made with whole milk, cream, or high-fat cheese. All other soups. Desserts and Sweets  Allowed: Sherbet, fruit ices, gelatins, meringues, and angel food cake. Homemade desserts with recommended fats, oils, and milk products. Jam, jelly, honey, marmalade, sugars, and syrups. Pure sugar candy, such  as gum drops, hard candy, jelly beans, marshmallows, mints, and small amounts of dark chocolate.  Avoid: Commercially prepared cakes, pies, cookies, frosting, pudding, or mixes for these products. Desserts containing whole milk products, chocolate, coconut, lard, palm oil, or palm kernel oil. Ice cream or ice cream drinks. Candy that contains chocolate, coconut, butter, hydrogenated fat, or unknown ingredients. Buttered syrups. Fats and Oils  Allowed: Vegetable oils: safflower, sunflower, corn, soybean, cottonseed, sesame, canola, olive, or peanut. Non-hydrogenated margarines. Salad dressing or mayonnaise: homemade or commercial, made with a recommended oil. Low or nonfat salad dressing or mayonnaise.  Limit added fats and oils to 6 to 8 tsp per day (includes fats used in cooking, baking, salads, and spreads on bread). Remember to count the "hidden fats" in foods.  Avoid: Solid fats and shortenings: butter, lard, salt pork, bacon drippings. Gravy containing meat fat, shortening, or suet. Cocoa butter, coconut. Coconut oil, palm oil, palm kernel oil, or hydrogenated oils: these ingredients are often used in bakery products, nondairy creamers, whipped toppings, candy, and commercially fried foods. Read  labels carefully. Salad dressings made of unknown oils, sour cream, or cheese, such as blue cheese and Roquef[place]. Cream, all kinds: half-and-half, light, heavy, or whipping. Sour cream or cream cheese (even if "light" or low-fat). Nondairy cream substitutes: coffee creamers and sour cream substitutes made with palm, palm kernel, hydrogenated oils, or coconut oil. Beverages  Allowed: Coffee (regular or decaffeinated), tea. Diet carbonated beverages, mineral water. Alcohol: Check with your caregiver. Moderation is recommended.  Avoid: Whole milk, regular sodas, and juice drinks with added sugar. Condiments  Allowed: All seasonings and condiments. Cocoa powder. "Cream" sauces made with recommended  ingredients.  Avoid: Carob powder made with hydrogenated fats. SAMPLE MENU Breakfast   cup orange juice   cup oatmeal  1 slice toast  1 tsp margarine  1 cup skim milk Lunch  [place] sandwich with 2 oz [place], 2 slices bread  Lettuce and tomato slices  Fresh fruit  Carrot sticks  Coffee or tea Snack  Fresh fruit or low-fat crackers Dinner  3 oz lean ground beef  1 baked potato  1 tsp margarine   cup asparagus  Lettuce salad  1 tbs non-creamy dressing   cup peach slices  1 cup skim milk Document Released: 06/12/2008 Document Revised: 03/04/2012 Document Reviewed: 11/03/2013 Exit[employer] Patient Information 2015 [place], [place]. This information is not intended to replace advice given to you by your health care provider. Make sure you discuss any questions you have with your health care provider.

## 2014-07-18 NOTE — Progress Notes (Signed)
  Echocardiogram 2D Echocardiogram has been performed.  Darlina Sicilian M 07/18/2014, 12:56 PM

## 2014-07-18 NOTE — Evaluation (Signed)
Physical Therapy Evaluation Patient Details Name: [name] MRN: [id number] DOB: 1926-05-01 Today's Date: 07/18/2014   History of Present Illness  [name] is a 80 y.o. male with h/o DM 2, HTN, prostate cancer presenting with syncope and h/o failure to thrive over the past 2 months. He passed out while sitting on the commode and was brought to the hospital. Was found to have an extensive DVT of the LLE.  Clinical Impression  Pt admitted with the above. Presents currently with functional limitations due to the deficits listed below (see PT Problem List). Tolerated gait, bed mobility, and transfer training today. Patient was modified independent with mobility prior to admission although wife states he "sat around a lot." He will need continued rehabilitation to allow pt to return to prior level of function and decreased burden of care. Motivated to work with PT today and was happy to get out of bed. Pt will benefit from skilled PT to increase their independence and safety with mobility to allow discharge to the venue listed below.      Follow Up Recommendations CIR    Equipment Recommendations  3in1 (PT)    Recommendations for Other Services Rehab consult;OT consult     Precautions / Restrictions Precautions Precautions: None      Mobility  Bed Mobility Overal bed mobility: Needs Assistance Bed Mobility: Supine to Sit;Sit to Supine     Supine to sit: Min assist;HOB elevated Sit to supine: Min assist;HOB elevated   General bed mobility comments: Min assist provided for truncal support when assuming seated position with PT hand provided for support to allow pt to pull himself up. Needs assist to scoot forward with bed pad. Min assist for LE suppor when entering bed. Cues throughout for technique.  Transfers Overall transfer level: Needs assistance Equipment used: Rolling walker (2 wheeled) Transfers: Sit to/from Stand Sit to Stand: Min assist         General  transfer comment: Min assist for boost to stand. Pt with loss of balance towards posterior requiring assist to correct once standing. Poor control with descent onto bed.  Ambulation/Gait Ambulation/Gait assistance: Min guard Ambulation Distance (Feet): 30 Feet Assistive device: Rolling walker (2 wheeled) Gait Pattern/deviations: Step-through pattern;Step-to pattern;Decreased stride length;Decreased step length - right;Decreased step length - left;Decreased dorsiflexion - left;Decreased dorsiflexion - right;Shuffle;Trunk flexed;Narrow base of support   Gait velocity interpretation: Below normal speed for age/gender General Gait Details: Educated on safe DME use with a rolling walker. Intermittent cues to lift feet/prevent shuffling. Close guard assist throughout ambulatory bout. One instance of loss of balance but pt able to correct with use of rolling walker and no physical assist from PT. Cues for upright posture and forward gaze.  Stairs            Wheelchair Mobility    Modified Rankin (Stroke Patients Only)       Balance Overall balance assessment: Needs assistance Sitting-balance support: No upper extremity supported;Feet supported Sitting balance-Leahy Scale: Fair     Standing balance support: Bilateral upper extremity supported Standing balance-Leahy Scale: Poor                               Pertinent Vitals/Pain Pain Assessment: 0-10 Pain Score: 7  Pain Location: LLE (medially from knee to groin) Pain Intervention(s): Monitored during session;Repositioned    Home Living Family/patient expects to be discharged to:: Private residence Living Arrangements: Spouse/significant other Available Help at  Discharge: Family;Available 24 hours/day Type of Home: House Home Access: Stairs to enter Entrance Stairs-Rails: None Entrance Stairs-Number of Steps: 2 Home Layout: One level Home Equipment: Walker - 2 wheels;Shower seat;Grab bars - tub/shower       Prior Function Level of Independence: Independent with assistive device(s)   Gait / Transfers Assistance Needed: RW for ambulation     Comments: RW for ambulation. Independent with ADLs per wife     Hand Dominance   Dominant Hand: Right    Extremity/Trunk Assessment   Upper Extremity Assessment: Defer to OT evaluation           Lower Extremity Assessment: LLE deficits/detail   LLE Deficits / Details: Decreased strength and ROM - limited assessment due to pain     Communication   Communication: HOH  Cognition Arousal/Alertness: Awake/alert Behavior During Therapy: WFL for tasks assessed/performed Overall Cognitive Status: Within Functional Limits for tasks assessed                      General Comments General comments (skin integrity, edema, etc.): LLE edematous - consistent with DVT diagnosis. Pt reports it feels better today compared to yesterday.    Exercises        Assessment/Plan    PT Assessment Patient needs continued PT services  PT Diagnosis Difficulty walking;Acute pain;Abnormality of gait   PT Problem List Decreased strength;Decreased range of motion;Decreased activity tolerance;Decreased balance;Decreased mobility;Decreased knowledge of use of DME;Pain  PT Treatment Interventions DME instruction;Gait training;Functional mobility training;Therapeutic activities;Therapeutic exercise;Balance training;Neuromuscular re-education;Patient/family education   PT Goals (Current goals can be found in the Care Plan section) Acute Rehab PT Goals Patient Stated Goal: Go to rehab in the hospital then go home PT Goal Formulation: With patient/family Time For Goal Achievement: 08/01/14 Potential to Achieve Goals: Good    Frequency Min 3X/week   Barriers to discharge        Co-evaluation               End of Session Equipment Utilized During Treatment: Gait belt Activity Tolerance: Patient tolerated treatment well Patient left: in bed;with  call bell/phone within reach;with family/visitor present Nurse Communication: Mobility status;Other (comment) (elevated BP)         Time: 8295-6213 PT Time Calculation (min): 29 min   Charges:   PT Evaluation $Initial PT Evaluation Tier I: 1 Procedure PT Treatments $Therapeutic Activity: 8-22 mins   PT G Codes:         Elayne Snare, Anderson  Ellouise Newer 07/18/2014, 5:08 PM

## 2014-07-18 NOTE — Plan of Care (Signed)
Problem: Phase I Progression Outcomes Goal: Hemodynamically stable Outcome: Completed/Met Date Met:  07/18/14 Goal: Anginal pain relieved Outcome: Completed/Met Date Met:  07/18/14 Goal: Aspirin unless contraindicated Outcome: Completed/Met Date Met:  07/18/14 Goal: MD aware of Cardiac Marker results Outcome: Completed/Met Date Met:  07/18/14 Goal: Voiding-avoid urinary catheter unless indicated Outcome: Completed/Met Date Met:  07/18/14 Goal: Other Phase I Outcomes/Goals Outcome: Completed/Met Date Met:  07/18/14

## 2014-07-19 ENCOUNTER — Inpatient Hospital Stay (HOSPITAL_COMMUNITY): Payer: Medicare Other

## 2014-07-19 LAB — HEPATIC FUNCTION PANEL
ALBUMIN: 3.2 g/dL — AB (ref 3.5–5.2)
ALK PHOS: 103 U/L (ref 39–117)
ALT: 12 U/L (ref 0–53)
AST: 16 U/L (ref 0–37)
Bilirubin, Direct: <0.2 mg/dL (ref 0.0–0.3)
TOTAL PROTEIN: 7.3 g/dL (ref 6.0–8.3)
Total Bilirubin: 0.3 mg/dL (ref 0.3–1.2)

## 2014-07-19 LAB — GLUCOSE, CAPILLARY
GLUCOSE-CAPILLARY: 204 mg/dL — AB (ref 70–99)
GLUCOSE-CAPILLARY: 321 mg/dL — AB (ref 70–99)
Glucose-Capillary: 205 mg/dL — ABNORMAL HIGH (ref 70–99)
Glucose-Capillary: 198 mg/dL — ABNORMAL HIGH (ref 70–99)

## 2014-07-19 LAB — CBC
HEMATOCRIT: 32.8 % — AB (ref 39.0–52.0)
HEMOGLOBIN: 10.3 g/dL — AB (ref 13.0–17.0)
MCH: 27.8 pg (ref 26.0–34.0)
MCHC: 31.4 g/dL (ref 30.0–36.0)
MCV: 88.4 fL (ref 78.0–100.0)
Platelets: 176 10*3/uL (ref 150–400)
RBC: 3.71 MIL/uL — AB (ref 4.22–5.81)
RDW: 13.9 % (ref 11.5–15.5)
WBC: 9.6 10*3/uL (ref 4.0–10.5)

## 2014-07-19 LAB — BASIC METABOLIC PANEL
ANION GAP: 11 (ref 5–15)
BUN: 27 mg/dL — ABNORMAL HIGH (ref 6–23)
CHLORIDE: 106 meq/L (ref 96–112)
CO2: 21 meq/L (ref 19–32)
Calcium: 9 mg/dL (ref 8.4–10.5)
Creatinine, Ser: 1.47 mg/dL — ABNORMAL HIGH (ref 0.50–1.35)
GFR calc non Af Amer: 41 mL/min — ABNORMAL LOW (ref >90)
GFR, EST AFRICAN AMERICAN: 48 mL/min — AB (ref >90)
Glucose, Bld: 289 mg/dL — ABNORMAL HIGH (ref 70–99)
POTASSIUM: 5 meq/L (ref 3.7–5.3)
SODIUM: 138 meq/L (ref 137–147)

## 2014-07-19 LAB — PSA: PSA: 9.66 ng/mL — ABNORMAL HIGH (ref <=4.00)

## 2014-07-19 LAB — LIPASE, BLOOD: Lipase: 23 U/L (ref 11–59)

## 2014-07-19 MED ORDER — IOHEXOL 300 MG/ML  SOLN
25.0000 mL | INTRAMUSCULAR | Status: AC
  Administered 2014-07-19: 25 mL via ORAL

## 2014-07-19 MED ORDER — ENSURE COMPLETE PO LIQD
237.0000 mL | Freq: Two times a day (BID) | ORAL | Status: DC
  Administered 2014-07-19 – 2014-07-20 (×3): 237 mL via ORAL

## 2014-07-19 MED ORDER — INSULIN ASPART 100 UNIT/ML ~~LOC~~ SOLN
5.0000 [IU] | Freq: Three times a day (TID) | SUBCUTANEOUS | Status: DC
  Administered 2014-07-19 – 2014-07-20 (×4): 5 [IU] via SUBCUTANEOUS

## 2014-07-19 MED ORDER — INSULIN GLARGINE 100 UNIT/ML ~~LOC~~ SOLN
20.0000 [IU] | Freq: Every day | SUBCUTANEOUS | Status: DC
  Administered 2014-07-19: 20 [IU] via SUBCUTANEOUS
  Filled 2014-07-19 (×2): qty 0.2

## 2014-07-19 NOTE — Plan of Care (Signed)
Problem: Phase I Progression Outcomes Goal: Pain controlled with appropriate interventions Outcome: Completed/Met Date Met:  07/19/14 Goal: Dyspnea controlled at rest (PE) Outcome: Completed/Met Date Met:  07/19/14 Goal: Tolerating diet Outcome: Completed/Met Date Met:  07/19/14 Goal: Initial discharge plan identified Outcome: Completed/Met Date Met:  07/19/14 Goal: Voiding-avoid urinary catheter unless indicated Outcome: Completed/Met Date Met:  07/19/14 Goal: Hemodynamically stable Outcome: Completed/Met Date Met:  07/19/14 Goal: Other Phase I Outcomes/Goals Outcome: Completed/Met Date Met:  07/19/14

## 2014-07-19 NOTE — Progress Notes (Signed)
Physical Therapy Treatment Patient Details Name: [name] MRN: [id number] DOB: 1926-01-20 Today's Date: 07/19/2014    History of Present Illness [name] is a 80 y.o. male with h/o DM 2, HTN, prostate cancer presenting with syncope and h/o failure to thrive over the past 2 months. He passed out while sitting on the commode and was brought to the hospital. Was found to have an extensive DVT of the LLE.    PT Comments    Pt demonstrating increased difficulty with balance initially today compared to PT evaluation note requiring up to mod A due to posterior lean. Pt min A for short distance gait trial (see details below) with RW. Per note in chart, pt not approved for CIR and anticipate pt to d/c home with HHPT and family as available for support. Pt will continue from PT services to address impairments in strength, balance, endurance, gait, and overall mobility.   Follow Up Recommendations  Home health PT;Supervision for mobility/OOB     Equipment Recommendations  Rolling walker with 5" wheels    Recommendations for Other Services       Precautions / Restrictions Precautions Precautions: Fall Restrictions Weight Bearing Restrictions: Yes LLE Weight Bearing:  (DVT)    Mobility  Bed Mobility Overal bed mobility: Needs Assistance Bed Mobility: Supine to Sit     Supine to sit: Supervision;HOB elevated Sit to supine: Supervision   General bed mobility comments: cues for scooting technique to get EOB prior to OOB  Transfers Overall transfer level: Needs assistance Equipment used: Rolling walker (2 wheeled) Transfers: Sit to/from Stand Sit to Stand: Min assist;Mod assist         General transfer comment: cues for hand placement and technique. Pt with posterior lean whille attempting to stand, requriing mod A at times and cues for forward weightshift. Once upright and balance stabilized able to maitnain with min A  Ambulation/Gait Ambulation/Gait assistance: Min  assist Ambulation Distance (Feet): 10 Feet Assistive device: Rolling walker (2 wheeled) Gait Pattern/deviations: Decreased stride length;Decreased step length - left;Decreased step length - right;Shuffle;Trunk flexed;Narrow base of support     General Gait Details: Cues for posture and safety with RW during short distance gait trial. Pt requiring A for balance and tendency for posterior lean today.   Stairs            Wheelchair Mobility    Modified Rankin (Stroke Patients Only)       Balance Overall balance assessment: Needs assistance Sitting-balance support: Feet supported;No upper extremity supported Sitting balance-Leahy Scale: Good     Standing balance support: Bilateral upper extremity supported Standing balance-Leahy Scale: Poor Standing balance comment: posterior lateral (L) lean initially with sit to stand; cues and manual facilitation for posture prior to initiating gait                    Cognition Arousal/Alertness: Awake/alert Behavior During Therapy: WFL for tasks assessed/performed Overall Cognitive Status: Within Functional Limits for tasks assessed                      Exercises General Exercises - Lower Extremity Long Arc Quad: AROM;Strengthening;Both;10 reps    General Comments General comments (skin integrity, edema, etc.): LLE still noted with increased edema. elevated LLE in recliner at end of session.      Pertinent Vitals/Pain Pain Assessment: 0-10 Pain Score: 3  Pain Location: LLE Pain Descriptors / Indicators:  ("pulling") Pain Intervention(s): Monitored during session;Premedicated before session  Home Living Family/patient expects to be discharged to:: Private residence Living Arrangements: Spouse/significant other Available Help at Discharge: Family;Available 24 hours/day Type of Home: House Home Access: Stairs to enter Entrance Stairs-Rails: None Home Layout: One level Home Equipment: Environmental consultant - 2  wheels;Shower seat;Grab bars - tub/shower      Prior Function Level of Independence: Independent with assistive device(s)  Gait / Transfers Assistance Needed: RW for ambulation       PT Goals (current goals can now be found in the care plan section) Acute Rehab PT Goals Patient Stated Goal: not stated PT Goal Formulation: With patient Time For Goal Achievement: 08/01/14 Potential to Achieve Goals: Good Progress towards PT goals: Progressing toward goals    Frequency  Min 3X/week    PT Plan Discharge plan needs to be updated (Pt not approved for CIR. Home with HHPT and family support)    Co-evaluation             End of Session Equipment Utilized During Treatment: Gait belt Activity Tolerance: Patient tolerated treatment well Patient left: in chair;with call bell/phone within reach;with nursing/sitter in room     Time: 1115-1138 PT Time Calculation (min): 23 min  Charges:  $Therapeutic Exercise: 8-22 mins $Therapeutic Activity: 8-22 mins                    G Codes:      [name] 07/19/2014, 11:57 AM

## 2014-07-19 NOTE — Progress Notes (Signed)
INITIAL NUTRITION ASSESSMENT  DOCUMENTATION CODES Per approved criteria  -Not Applicable   INTERVENTION:  Chocolate Ensure Complete PO BID, each supplement provides 350 kcal and 13 grams of protein  NUTRITION DIAGNOSIS: Inadequate oral intake related to poor appetite as evidenced by patient reports consuming one meal per day PTA.   Goal: Intake to meet >90% of estimated nutrition needs.  Monitor:  PO intake, labs, weight trend.  Reason for Assessment: MST  80 y.o. male  Admitting Dx: Syncope  ASSESSMENT: 80 y.o. male with past medical history of diabetes, HTN and history of prostate cancer came to the hospital because of syncope. Patient for the past 2 month was showing signs of failure to thrive with generalized weakness, not able to eat or drink appropriately. In the ED he was found to have acute extensive DVT involving the left lower extremity, his CT scan of the head was negative, and has creatinine of 1.7.  Patient reports that he has lost weight PTA because he was only eating one meal per day at home. Per MD notes, patient has lost 30 lb. Nutrition focused physical exam completed.  No muscle or subcutaneous fat depletion noticed. He agreed to try chocolate Ensure Complete supplements between meals.  Height: Ht Readings from Last 1 Encounters:  07/17/14 5\' 9"  (1.753 m)    Weight: Wt Readings from Last 1 Encounters:  07/19/14 152 lb 1.9 oz (69 kg)    Ideal Body Weight: 72.7 kg  % Ideal Body Weight: 95%  Wt Readings from Last 10 Encounters:  07/19/14 152 lb 1.9 oz (69 kg)  05/04/12 140 lb (63.504 kg)  11/15/11 165 lb 9.1 oz (75.1 kg)  07/22/08 178 lb 6.4 oz (80.922 kg)  06/16/08 172 lb 6.4 oz (78.2 kg)  04/26/08 162 lb (73.483 kg)  04/06/08 162 lb 3.2 oz (73.573 kg)  03/10/08 164 lb (74.39 kg)  03/08/08 164 lb (74.39 kg)  02/19/08 164 lb 3.2 oz (74.481 kg)    Usual Body Weight: ~182 lb ???  % Usual Body Weight: 84%  BMI:  Body mass index is 22.45  kg/(m^2).  Estimated Nutritional Needs: Kcal: 1900-2100 Protein: 95-110 gm Fluid: >/= 2 L  Skin: intact  Diet Order: Diet Carb Modified  EDUCATION NEEDS: -Education not appropriate at this time   Intake/Output Summary (Last 24 hours) at 07/19/14 1353 Last data filed at 07/19/14 1200  Gross per 24 hour  Intake   2205 ml  Output    600 ml  Net   1605 ml    Last BM: 10/31   Labs:   Recent Labs Lab 07/17/14 0949 07/18/14 0412 07/19/14 0329  NA 136* 136* 138  K 4.8 4.7 5.0  CL 97 104 106  CO2 21 20 21   BUN 27* 30* 27*  CREATININE 1.70* 1.68* 1.47*  CALCIUM 10.2 9.3 9.0  GLUCOSE 279* 278* 289*    CBG (last 3)   Recent Labs  07/18/14 2205 07/19/14 0748 07/19/14 1137  GLUCAP 316* 204* 321*    Scheduled Meds: . allopurinol  300 mg Oral Daily  . docusate sodium  100 mg Oral BID  . insulin aspart  0-9 Units Subcutaneous TID WC  . insulin aspart  5 Units Subcutaneous TID WC  . insulin glargine  20 Units Subcutaneous QHS  . iohexol  25 mL Oral Q1 Hr x 2  . metoprolol tartrate  12.5 mg Oral BID  . mirtazapine  15 mg Oral QHS  . pantoprazole  40 mg Oral Daily  .  rivaroxaban  15 mg Oral BID WC   Followed by  . [START ON 08/08/2014] rivaroxaban  20 mg Oral Q supper  . sertraline  50 mg Oral Daily  . simvastatin  20 mg Oral QHS  . sodium chloride  3 mL Intravenous Q12H    Continuous Infusions: . sodium chloride 75 mL/hr at 07/19/14 1205    Past Medical History  Diagnosis Date  . Dementia   . Hypertension   . Gout   . Hyperlipidemia   . Fracture of lumbar spine 11-08-11  . Coronary artery disease   . Myocardial infarction 2004    NSTEMI/E-chart  . Chronic kidney disease (CKD), stage II (mild)     /E-chart  . Prostate cancer     S/P radiation  . Osteoarthritis     /E-chart  . GI bleeding 2004; 01/2011    /E-chart  . Blood transfusion   . Anemia   . Diverticulosis   . Type II diabetes mellitus   . GERD (gastroesophageal reflux disease)   .  Dysrhythmia 05/01/2012    bradycardia    Past Surgical History  Procedure Laterality Date  . Left leg surgery  1950's    "injured in [place] war"  . Right shoulder surgery    . Left cataract extraction    . Tonsillectomy      Molli Barrows, RD, LDN, [place] Pager [phone] After Hours Pager [phone]

## 2014-07-19 NOTE — Progress Notes (Addendum)
Inpatient Diabetes Program Recommendations  AACE/ADA: New Consensus Statement on Inpatient Glycemic Control (2013)  Target Ranges:  Prepandial:   less than 140 mg/dL      Peak postprandial:   less than 180 mg/dL (1-2 hours)      Critically ill patients:  140 - 180 mg/dL     Results for [name], [name] (MRN [id number]) as of 07/19/2014 07:46  Ref. Range 07/18/2014 08:00 07/18/2014 11:49 07/18/2014 16:50 07/18/2014 22:05  Glucose-Capillary Latest Range: 70-99 mg/dL 219 (H) 259 (H) 298 (H) 316 (H)    Results for [name], [name] (MRN [id number]) as of 07/19/2014 07:46  Ref. Range 07/19/2014 03:29  Glucose Latest Range: 70-99 mg/dL 289 (H)     Patient admitted with Syncope/ DVT/ Failure to Thrive.  History of DM, HTN, Dementia, CKD 2.   Home DM Meds: Lantus 20 units QHS       Novolog 10 units tidwc   Current DM Meds: Lantus 15 units QHS         Novolog Sensitive SSI     MD- Please consider the following in-hospital insulin adjustments:  1. Increase Lantus to 20 units QHS 2. Add 1/2 home dose of Novolog Meal Coverage- Novolog 5 units tid with meals    Addendum 1110am: Spoke with patient this AM.  Per patient, he lives with his daughter and she takes care of his medications and general care.  Per patient, daughter checks his CBGs at home, give him his insulin, and provides all his meals for him.  Pt sees Dr. Jonathon Jordan with [facility] for primary care.   Will follow Jeff Quaker RN, MSN, CDE Diabetes Coordinator Inpatient Diabetes Program Team Pager: [phone] (8a-10p)

## 2014-07-19 NOTE — Progress Notes (Signed)
UR Completed Tammi Boulier Graves-Bigelow, RN,BSN [phone]  

## 2014-07-19 NOTE — Evaluation (Signed)
Occupational Therapy Evaluation Patient Details Name: [name] MRN: [id number] DOB: Oct 16, 1925 Today's Date: 07/19/2014    History of Present Illness [name] is a 80 y.o. male with h/o DM 2, HTN, prostate cancer presenting with syncope and h/o failure to thrive over the past 2 months. He passed out while sitting on the commode and was brought to the hospital. Was found to have an extensive DVT of the LLE.   Clinical Impression   Pt admitted with above. Pt independent with ADLs, PTA. Feel pt will benefit from acute OT to increase independence with BADLs and mobility prior to d/c. Recommending HHOT and 24/7 supervision at d/c.    Follow Up Recommendations  Home health OT;Supervision/Assistance - 24 hour    Equipment Recommendations  3 in 1 bedside comode    Recommendations for Other Services       Precautions / Restrictions Precautions Precautions: Fall Restrictions Weight Bearing Restrictions: Yes LLE Weight Bearing:  (DVT)      Mobility Bed Mobility Overal bed mobility: Needs Assistance Bed Mobility: Supine to Sit;Sit to Supine     Supine to sit: Supervision Sit to supine: Supervision   General bed mobility comments: assist to scoot HOB-cues for technique. Cues for technique when going suping to sit.  Transfers Overall transfer level: Needs assistance Equipment used: Rolling walker (2 wheeled) Transfers: Sit to/from Stand Sit to Stand: Mod assist         General transfer comment: Cues for technique. Assist to boost.     Balance                                            ADL Overall ADL's : Needs assistance/impaired     Grooming: Wash/dry face;Oral care;Min guard;Standing;Minimal assistance (Min A-leaning posteriorly on one occassion)   Upper Body Bathing: Min guard;Standing   Lower Body Bathing: Min guard;Sit to/from stand   Upper Body Dressing : Min guard;Standing   Lower Body Dressing: Minimal assistance;Sit to/from  stand   Toilet Transfer: Minimal assistance;Ambulation;RW (bed)   Toileting- Clothing Manipulation and Hygiene: Min guard;Sit to/from stand       Functional mobility during ADLs: Minimal assistance;Rolling walker General ADL Comments: Pt performed some ADLs at sink.     Vision                     Perception     Praxis      Pertinent Vitals/Pain Pain Assessment: 0-10 Pain Score: 8  Pain Location: LLE Pain Descriptors / Indicators:  ("drawing") Pain Intervention(s): Monitored during session     Hand Dominance Right   Extremity/Trunk Assessment Upper Extremity Assessment Upper Extremity Assessment: Overall WFL for tasks assessed   Lower Extremity Assessment Lower Extremity Assessment: Defer to PT evaluation       Communication Communication Communication: HOH   Cognition Arousal/Alertness: Awake/alert Behavior During Therapy: WFL for tasks assessed/performed Overall Cognitive Status: Within Functional Limits for tasks assessed                     General Comments       Exercises       Shoulder Instructions      Home Living Family/patient expects to be discharged to:: Private residence Living Arrangements: Spouse/significant other Available Help at Discharge: Family;Available 24 hours/day Type of Home: House Home Access: Stairs to enter [employer]  of Steps: 2 Entrance Stairs-Rails: None Home Layout: One level     Bathroom Shower/Tub: Occupational psychologist: Standard     Home Equipment: Environmental consultant - 2 wheels;Shower seat;Grab bars - tub/shower          Prior Functioning/Environment Level of Independence: Independent with assistive device(s)  Gait / Transfers Assistance Needed: RW for ambulation          OT Diagnosis: Generalized weakness;Acute pain   OT Problem List: Decreased strength;Decreased activity tolerance;Impaired balance (sitting and/or standing);Pain;Decreased knowledge of precautions;Decreased  knowledge of use of DME or AE   OT Treatment/Interventions: Self-care/ADL training;DME and/or AE instruction;Therapeutic activities;Patient/family education;Balance training    OT Goals(Current goals can be found in the care plan section) Acute Rehab OT Goals Patient Stated Goal: not stated OT Goal Formulation: With patient Time For Goal Achievement: 07/26/14 Potential to Achieve Goals: Good ADL Goals Pt Will Perform Lower Body Bathing: with set-up;sit to/from stand Pt Will Perform Lower Body Dressing: with set-up;sit to/from stand Pt Will Transfer to Toilet: with supervision;ambulating Pt Will Perform Toileting - Clothing Manipulation and hygiene: sit to/from stand;with modified independence  OT Frequency: Min 2X/week   Barriers to D/C:            Co-evaluation              End of Session Equipment Utilized During Treatment: Gait belt;Rolling walker  Activity Tolerance: Patient tolerated treatment well Patient left: in bed;with call bell/phone within reach;with bed alarm set   Time: 5035-4656 OT Time Calculation (min): 30 min Charges:  OT General Charges $OT Visit: 1 Procedure OT Evaluation $Initial OT Evaluation Tier I: 1 Procedure OT Treatments $Self Care/Home Management : 8-22 mins G-CodesBenito Mccreedy OTR/L [phone] 07/19/2014, 10:41 AM

## 2014-07-19 NOTE — Plan of Care (Signed)
Problem: Consults Goal: Venous Thromboembolism Patient Education See Patient Education Module for education specifics. Outcome: Completed/Met Date Met:  07/19/14 Goal: Diagnosis - Venous Thromboembolism (VTE) Choose a selection Outcome: Completed/Met Date Met:  07/19/14 DVT (Deep Vein Thrombosis) Goal: Pharmacy Consult for anticoagulation Outcome: Completed/Met Date Met:  07/19/14 Goal: Skin Care Protocol Initiated - if Braden Score 18 or less If consults are not indicated, leave blank or document N/A Outcome: Completed/Met Date Met:  07/19/14 Goal: Nutrition Consult-if indicated Outcome: Completed/Met Date Met:  07/19/14 Goal: Diabetes Guidelines if Diabetic/Glucose > 140 If diabetic or lab glucose is > 140 mg/dl - Initiate Diabetes/Hyperglycemia Guidelines & Document Interventions  Outcome: Completed/Met Date Met:  07/19/14

## 2014-07-19 NOTE — Progress Notes (Signed)
Patient was screened by Gerlean Ren for appropriateness for an Inpatient Acute Rehab consult. We will in all likelihood  not be able to receive insurance  authorization for admission to CIR with his current diagnoses, therefore at this time, we are not recommending CIR consult.  Note he is at min to min guard level with PT, so may consider appropriateness of HH based therapies if he has adequate family support.    [place] Admissions Coordinator Cell [phone] Office [phone]

## 2014-07-19 NOTE — Care Management Note (Addendum)
    Page 1 of 2   07/19/2014     12:35:53 PM CARE MANAGEMENT NOTE 07/19/2014  Patient:  [name], [name]   Account Number:  [id number]  Date Initiated:  07/19/2014  Documentation initiated by:  GRAVES-BIGELOW,Verlan Grotz  Subjective/Objective Assessment:   Pt admitted for syncope.Positive DVT- now on xarelto. Pt is from home with daughter.     Action/Plan:   CM spoke with daughter and is agreeable with [facility] services. Referral made and SOC to begin within 24-48 hrs post d/c. DME 3 n1 to be delivered to rm before d/c.   Anticipated DC Date:  07/19/2014   Anticipated DC Plan:  [place]  CM consult      PAC Choice  [place]   Choice offered to / List presented to:  C-4 Adult Children   DME arranged  3-N-1      DME agency  [place] arranged  [place].   Status of service:  Completed, signed off Medicare Important Message given?  YES (If response is "NO", the following Medicare IM given date fields will be blank) Date Medicare IM given:  07/19/2014 Medicare IM given by:  GRAVES-BIGELOW,Lexys Milliner Date Additional Medicare IM given:   Additional Medicare IM given by:    Discharge Disposition:  [place]  Per UR Regulation:  Reviewed for med. necessity/level of care/duration of stay  If discussed at [place] of Stay Meetings, dates discussed:    Comments:  07-19-14 1214 Co pay card left at bedside and explained to daugther. Xarelto Benefits Check: PRIOR AUTH IS REQUIRED [id number].... PT COPAY WILL BE $45- MD please call for prior authorization. No further needs from CM at this time.

## 2014-07-19 NOTE — Progress Notes (Addendum)
[facility] PROGRESS NOTE  [name] CHE:[id number] DOB: January 06, 1926 DOA: 07/17/2014 PCP: Lilian Coma, MD  Assessment/Plan: Principal Problem:   Syncope Active Problems:   Diabetes mellitus type 2, uncontrolled, without complications   Essential hypertension   PROSTATE CANCER, HX OF   Fall at home   Orthostatic hypotension   Acute deep vein thrombosis (DVT) of distal vein of left lower extremity      Syncope-according to the daughter 3 episodes in the last 1 week, no history of seizures - history is unclear in regards to this - CT head negative, will do an MRI of the brain to rule out CVA, check orthostatics Also check a 2-D echo, last 2-D echo was done in 2013  Continue telemetry  - V/Q negative, not hypoxic - noted to be orthostatic on vital signs per Heart rate - hydrate and follow orthostatics- hold Flomax for now Discussed with the daughter most likely the patient's syncopal episodes could be related to orthostatic hypotension  Weight loss Patient has had a 30 pound weight loss according to the daughter CEA mildly elevated We'll obtain a CT scan of the abdomen and pelvis  ARF on CKD -likely prerenal due to poor PO intake and use of Lasix - cont to hydrate- holding Lasix  DVT- extensive, left leg - started on Xarelto , discussed risk versus benefit of Coumadin and elliquis, daughter agreeable to continuing Xarelto - patient admits to very poor mobility over the past few weeks with a good amount of time spent in a chair Patient still complaining of left knee swelling Will obtain plain x-rays, possibly may also need an MRI   Diabetes mellitus type 2, uncontrolled, without complications - Lantus increased to 20 U , started on NovoLog    Essential hypertension - home meds on hold  - BP elevated- resume Lopressor   PROSTATE CANCER, HX OF Daughter confirms that patient has a urologist Would defer repeat PSA to his urologist  Code Status:  full Family Communication: family updated about patient's clinical progress ,discussed the plan of care  [name] Daughter [phone]  Disposition Plan:  I have updated the patient's daughter and told her to anticipate discharge tomorrow    Brief narrative: [name] is a 80 y.o. male with past medical history of diabetes, HTN and history of prostate cancer came to the hospital because of syncope. Most of the history obtained from patient daughter [name]) at bedside. Patient for the past 2 month was showing signs of failure to thrive with generalized weakness, not able to eat or drink appropriately. This morning he syncopized in the bathroom while he was sitting in the tablet, he was found by his daughter after he spent about an hour in the bathroom. In the ED he was found to have acute extensive DVT involving the left lower extremity, his CT scan of the head was negative, and has creatinine of 1.7.  Consultants:  none  Procedures:  none  Antibiotics:  none  HPI/Subjective: Complaining of left knee pain  Objective: Filed Vitals:   07/18/14 2200 07/19/14 0443 07/19/14 0955 07/19/14 0956  BP: 138/74 136/65 151/78   Pulse: 66 54  75  Temp: 98 F (36.7 C) 97.4 F (36.3 C)    TempSrc: Oral Oral    Resp: 18 18    Height:      Weight:  69 kg (152 lb 1.9 oz)    SpO2: 99% 100%      Intake/Output Summary (Last 24 hours) at  07/19/14 1147 Last data filed at 07/19/14 0600  Gross per 24 hour  Intake   1905 ml  Output    600 ml  Net   1305 ml    Exam:  General: alert & oriented x 3 In NAD  Cardiovascular: RRR, nl S1 s2  Respiratory: Decreased breath sounds at the bases, scattered rhonchi, no crackles  Abdomen: soft +BS NT/ND, no masses palpable  Extremities: swelling of the left knee with mild effusion, no warmth      Data Reviewed: Basic Metabolic Panel:  Recent Labs Lab 07/17/14 0949 07/18/14 0412 07/19/14 0329  NA 136* 136* 138  K 4.8 4.7 5.0  CL  97 104 106  CO2 21 20 21   GLUCOSE 279* 278* 289*  BUN 27* 30* 27*  CREATININE 1.70* 1.68* 1.47*  CALCIUM 10.2 9.3 9.0    Liver Function Tests:  Recent Labs Lab 07/17/14 0949  AST 22  ALT 14  ALKPHOS 106  BILITOT 0.4  PROT 8.5*  ALBUMIN 4.1   No results for input(s): LIPASE, AMYLASE in the last 168 hours. No results for input(s): AMMONIA in the last 168 hours.  CBC:  Recent Labs Lab 07/17/14 0949 07/18/14 0412 07/19/14 0329  WBC 11.6* 12.6* 9.6  NEUTROABS 9.1*  --   --   HGB 12.7* 10.8* 10.3*  HCT 40.9 33.9* 32.8*  MCV 88.9 87.4 88.4  PLT 190 167 176    Cardiac Enzymes: No results for input(s): CKTOTAL, CKMB, CKMBINDEX, TROPONINI in the last 168 hours. BNP (last 3 results) No results for input(s): PROBNP in the last 8760 hours.   CBG:  Recent Labs Lab 07/18/14 0800 07/18/14 1149 07/18/14 1650 07/18/14 2205 07/19/14 0748  GLUCAP 219* 259* 298* 316* 204*    No results found for this or any previous visit (from the past 240 hour(s)).   Studies: Dg Chest 2 View  07/17/2014   CLINICAL DATA:  Generalized weakness.  EXAM: CHEST - 2 VIEW  COMPARISON:  10/02/2013  FINDINGS: The heart size and mediastinal contours are within normal limits. Improved aeration at the lung bases compared to the prior study. There is no evidence of pulmonary edema, consolidation, pneumothorax, nodule or pleural fluid. The visualized skeletal structures are unremarkable.  IMPRESSION: No active disease.   Electronically Signed   By: Aletta Edouard M.D.   On: 07/17/2014 11:23   Ct Head Wo Contrast  07/17/2014   CLINICAL DATA:  Syncope, frequent falling, blurred vision and generalized weakness. History of dementia and prostate carcinoma.  EXAM: CT HEAD WITHOUT CONTRAST  TECHNIQUE: Contiguous axial images were obtained from the base of the skull through the vertex without intravenous contrast.  COMPARISON:  10/02/2013  FINDINGS: Stable cortical atrophy and periventricular small vessel  disease. The brain demonstrates no evidence of hemorrhage, infarction, edema, mass effect, extra-axial fluid collection, hydrocephalus or mass lesion. The skull is unremarkable.  IMPRESSION: No acute findings.  Stable atrophy and small vessel disease.   Electronically Signed   By: Aletta Edouard M.D.   On: 07/17/2014 10:56   Nm Pulmonary Perf And Vent  07/17/2014   CLINICAL DATA:  Syncopal episode  EXAM: NUCLEAR MEDICINE VENTILATION - PERFUSION LUNG SCAN  TECHNIQUE: Ventilation images were obtained in multiple projections using inhaled aerosol technetium 99 M DTPA. Perfusion images were obtained in multiple projections after intravenous injection of Tc-63m MAA.  RADIOPHARMACEUTICALS:  Forty mCi Tc-41m DTPA aerosol and 6 mCi Tc-74m MAA  COMPARISON:  Chest x-ray from earlier in the same day  FINDINGS: Ventilation: No focal ventilation defect.  Perfusion: No wedge shaped peripheral perfusion defects to suggest acute pulmonary embolism.  IMPRESSION: No evidence of pulmonary embolism.   Electronically Signed   By: Inez Catalina M.D.   On: 07/17/2014 18:21    Scheduled Meds: . allopurinol  300 mg Oral Daily  . docusate sodium  100 mg Oral BID  . insulin aspart  0-9 Units Subcutaneous TID WC  . insulin aspart  5 Units Subcutaneous TID WC  . insulin glargine  20 Units Subcutaneous QHS  . metoprolol tartrate  12.5 mg Oral BID  . mirtazapine  15 mg Oral QHS  . pantoprazole  40 mg Oral Daily  . rivaroxaban  15 mg Oral BID WC   Followed by  . [START ON 08/08/2014] rivaroxaban  20 mg Oral Q supper  . sertraline  50 mg Oral Daily  . simvastatin  20 mg Oral QHS  . sodium chloride  3 mL Intravenous Q12H   Continuous Infusions: . sodium chloride 75 mL/hr at 07/18/14 2243    Principal Problem:   Syncope Active Problems:   Diabetes mellitus type 2, uncontrolled, without complications   Essential hypertension   PROSTATE CANCER, HX OF   Fall at home   Orthostatic hypotension   Acute deep vein  thrombosis (DVT) of distal vein of left lower extremity    Time spent: 40 minutes   [facility] Pager [phone]. If 7PM-7AM, please contact night-coverage at [email or web address], password [facility] 07/19/2014, 11:47 AM  LOS: 2 days

## 2014-07-20 LAB — COMPREHENSIVE METABOLIC PANEL
ALT: 17 U/L (ref 0–53)
ANION GAP: 14 (ref 5–15)
AST: 27 U/L (ref 0–37)
Albumin: 3.3 g/dL — ABNORMAL LOW (ref 3.5–5.2)
Alkaline Phosphatase: 123 U/L — ABNORMAL HIGH (ref 39–117)
BILIRUBIN TOTAL: 0.4 mg/dL (ref 0.3–1.2)
BUN: 19 mg/dL (ref 6–23)
CO2: 20 meq/L (ref 19–32)
CREATININE: 1.15 mg/dL (ref 0.50–1.35)
Calcium: 9.4 mg/dL (ref 8.4–10.5)
Chloride: 102 mEq/L (ref 96–112)
GFR calc Af Amer: 65 mL/min — ABNORMAL LOW (ref >90)
GFR, EST NON AFRICAN AMERICAN: 56 mL/min — AB (ref >90)
GLUCOSE: 222 mg/dL — AB (ref 70–99)
Potassium: 4.5 mEq/L (ref 3.7–5.3)
Sodium: 136 mEq/L — ABNORMAL LOW (ref 137–147)
Total Protein: 7.5 g/dL (ref 6.0–8.3)

## 2014-07-20 LAB — GLUCOSE, CAPILLARY
GLUCOSE-CAPILLARY: 225 mg/dL — AB (ref 70–99)
Glucose-Capillary: 93 mg/dL (ref 70–99)

## 2014-07-20 MED ORDER — RIVAROXABAN 15 MG PO TABS: 15.0000 mg | ORAL_TABLET | Freq: Two times a day (BID) | ORAL | Status: DC

## 2014-07-20 MED ORDER — HYDROCODONE-ACETAMINOPHEN 5-325 MG PO TABS: 1.0000 | ORAL_TABLET | Freq: Three times a day (TID) | ORAL | Status: DC | PRN

## 2014-07-20 MED ORDER — RIVAROXABAN 20 MG PO TABS: 20.0000 mg | ORAL_TABLET | Freq: Every day | ORAL | Status: AC

## 2014-07-20 NOTE — Progress Notes (Signed)
Pt's BP 179/75. Np on call made aware. Will recheck bp. Will cont to monitor pt.

## 2014-07-20 NOTE — Discharge Summary (Signed)
Physician Discharge Summary  [name] MRN: [id number] DOB/AGE: 1926/06/03 80 y.o.  PCP: Lilian Coma, MD   Admit date: 07/17/2014 Discharge date: 07/20/2014  Discharge Diagnoses:     Syncope Active Problems:   Diabetes mellitus type 2, uncontrolled, without complications   Essential hypertension   PROSTATE CANCER, HX OF   Fall at home   Orthostatic hypotension   Acute deep vein thrombosis (DVT) of distal vein of left lower extremity  Follow up recommendation  CBC , BMP in one week     Medication List    TAKE these medications        acetaminophen 500 MG tablet  Commonly known as:  TYLENOL  Take 500 mg by mouth daily as needed (pain).     allopurinol 300 MG tablet  Commonly known as:  ZYLOPRIM  Take 300 mg by mouth daily.     docusate sodium 100 MG capsule  Commonly known as:  COLACE  Take 100 mg by mouth 2 (two) times daily.     furosemide 40 MG tablet  Commonly known as:  LASIX  Take 40 mg by mouth daily.     HYDROcodone-acetaminophen 5-325 MG per tablet  Commonly known as:  NORCO/VICODIN  Take 1 tablet by mouth 3 (three) times daily as needed for severe pain.     INTEGRA 62.5-62.5-40-3 MG Caps  Take 1 capsule by mouth daily.     LANTUS SOLOSTAR 100 UNIT/ML Solostar Pen  Generic drug:  Insulin Glargine  Inject 20 Units into the skin at bedtime.     metoprolol tartrate 25 MG tablet  Commonly known as:  LOPRESSOR  Take 12.5 mg by mouth 2 (two) times daily.     mirtazapine 15 MG tablet  Commonly known as:  REMERON  Take 15 mg by mouth at bedtime.     NOVOLOG FLEXPEN 100 UNIT/ML FlexPen  Generic drug:  insulin aspart  Inject 10 Units into the skin 3 (three) times daily with meals.     omeprazole 20 MG capsule  Commonly known as:  PRILOSEC  Take 20 mg by mouth daily.     Rivaroxaban 15 MG Tabs tablet  Commonly known as:  XARELTO  Take 1 tablet (15 mg total) by mouth 2 (two) times daily with a meal.     rivaroxaban 20 MG Tabs  tablet  Commonly known as:  XARELTO  Take 1 tablet (20 mg total) by mouth daily with supper.  Start taking on:  08/08/2014     sertraline 50 MG tablet  Commonly known as:  ZOLOFT  Take 50 mg by mouth daily.     simvastatin 20 MG tablet  Commonly known as:  ZOCOR  Take 20 mg by mouth at bedtime.     tamsulosin 0.4 MG Caps capsule  Commonly known as:  FLOMAX  Take 0.4 mg by mouth daily after breakfast.        Discharge Condition: *stable  Disposition: 01-Home or Self Care   Consults:     Significant Diagnostic Studies: Ct Abdomen Pelvis Wo Contrast  07/19/2014   CLINICAL DATA:  Poor p.o. intake for 3 weeks.  Generalize weakness.  EXAM: CT ABDOMEN AND PELVIS WITHOUT CONTRAST  TECHNIQUE: Multidetector CT imaging of the abdomen and pelvis was performed following the standard protocol without IV contrast.  COMPARISON:  11/08/2011.  FINDINGS: Lower chest: Atelectasis identified within both lung bases. There is no pleural effusion. Atherosclerotic calcifications involving the LAD, left circumflex and RCA coronary arteries noted.  Hepatobiliary: The liver appears normal. The gallbladder is unremarkable. There is no biliary dilatation.  Pancreas: Normal appearance of the pancreas.  Spleen: The spleen is on unremarkable.  Adrenals/Urinary Tract: The adrenal glands are both normal. Normal appearance of both kidneys. There is no nephrolithiasis or hydronephrosis. The urinary bladder appears normal.  Stomach/Bowel: The stomach appears within normal limits. The small bowel loops are unremarkable. The appendix is visualized and appears within normal limits. Multiple mid and distal colonic diverticula identified without acute inflammation.  Vascular/Lymphatic: There is calcified atherosclerotic disease involving the abdominal aorta. No aneurysm. No retroperitoneal adenopathy. No enlarged mesenteric lymph nodes. No pelvic or inguinal adenopathy identified.  Reproductive: The prostate gland and seminal  vesicles appear normal.  Other: Subcutaneous fat stranding is identified within the left buttock and lateral aspect of the left upper thigh.  Musculoskeletal: Compression fracture involving the central portion of the L1 vertebra is again identified. When compared with the previous examination this is not significantly changed.  IMPRESSION: 1. No acute findings within the abdomen or pelvis. 2. Atherosclerotic disease. 3. Similar appearance of L1 compression fracture when compared with MR from 11/09/2011.   Electronically Signed   By: Kerby Moors M.D.   On: 07/19/2014 15:04   Dg Chest 2 View  07/17/2014   CLINICAL DATA:  Generalized weakness.  EXAM: CHEST - 2 VIEW  COMPARISON:  10/02/2013  FINDINGS: The heart size and mediastinal contours are within normal limits. Improved aeration at the lung bases compared to the prior study. There is no evidence of pulmonary edema, consolidation, pneumothorax, nodule or pleural fluid. The visualized skeletal structures are unremarkable.  IMPRESSION: No active disease.   Electronically Signed   By: Aletta Edouard M.D.   On: 07/17/2014 11:23   Dg Knee 1-2 Views Left  07/19/2014   CLINICAL DATA:  Left knee pain and swelling.  EXAM: LEFT KNEE - 1-2 VIEW  COMPARISON:  None.  FINDINGS: There is no evidence of fracture, dislocation, or joint effusion. Severe narrowing of medial joint space is noted with osteophyte formation. Mild narrowing and osteophyte formation is seen involving the lateral space with associated chondrocalcinosis. Diffuse osteopenia is noted. Osteochondral defect is seen in the medial femoral condyle which may represent old osteochondritis dissecans. Soft tissues are unremarkable.  IMPRESSION: Severe degenerative joint disease is noted. No acute abnormality seen in the left knee.   Electronically Signed   By: Sabino Dick M.D.   On: 07/19/2014 16:33   Ct Head Wo Contrast  07/17/2014   CLINICAL DATA:  Syncope, frequent falling, blurred vision and  generalized weakness. History of dementia and prostate carcinoma.  EXAM: CT HEAD WITHOUT CONTRAST  TECHNIQUE: Contiguous axial images were obtained from the base of the skull through the vertex without intravenous contrast.  COMPARISON:  10/02/2013  FINDINGS: Stable cortical atrophy and periventricular small vessel disease. The brain demonstrates no evidence of hemorrhage, infarction, edema, mass effect, extra-axial fluid collection, hydrocephalus or mass lesion. The skull is unremarkable.  IMPRESSION: No acute findings.  Stable atrophy and small vessel disease.   Electronically Signed   By: Aletta Edouard M.D.   On: 07/17/2014 10:56   Mr Brain Wo Contrast  07/19/2014   CLINICAL DATA:  Initial evaluation for syncope. Three episodes in past week.  EXAM: MRI HEAD WITHOUT CONTRAST  TECHNIQUE: Multiplanar, multiecho pulse sequences of the brain and surrounding structures were obtained without intravenous contrast.  COMPARISON:  Prior CT from 07/17/2014  FINDINGS: Diffuse prominence of the CSF containing spaces  is compatible with generalized age-related cerebral atrophy. Minimal small vessel changes present within the periventricular and deep white matter both cerebral hemispheres.  No mass lesion or midline shift. Ventricular prominence present related to global parenchymal volume loss without hydrocephalus. No extra-axial fluid collection.  No abnormal foci of restricted diffusion to suggest acute intracranial infarct. Gray-white matter differentiation maintained. Normal flow voids seen within the intracranial vasculature.  Craniocervical junction widely patent. Prominent pannus with erosive changes seen about the dens. Pituitary gland normal. No acute abnormality seen about the orbits.  Visualized bone marrow signal intensity within normal limits. Scalp soft tissues unremarkable.  Paranasal sinuses and mastoid air cells are clear.  IMPRESSION: 1. No acute intracranial infarct or other abnormality identified. 2.  Global age-related cerebral atrophy.   Electronically Signed   By: Jeannine Boga M.D.   On: 07/19/2014 22:22   Nm Pulmonary Perf And Vent  07/17/2014   CLINICAL DATA:  Syncopal episode  EXAM: NUCLEAR MEDICINE VENTILATION - PERFUSION LUNG SCAN  TECHNIQUE: Ventilation images were obtained in multiple projections using inhaled aerosol technetium 99 M DTPA. Perfusion images were obtained in multiple projections after intravenous injection of Tc-69mMAA.  RADIOPHARMACEUTICALS:  Forty mCi Tc-979mTPA aerosol and 6 mCi Tc-9957mA  COMPARISON:  Chest x-ray from earlier in the same day  FINDINGS: Ventilation: No focal ventilation defect.  Perfusion: No wedge shaped peripheral perfusion defects to suggest acute pulmonary embolism.  IMPRESSION: No evidence of pulmonary embolism.   Electronically Signed   By: MarInez CatalinaD.   On: 07/17/2014 18:21      Microbiology: No results found for this or any previous visit (from the past 240 hour(s)).   Labs: Results for orders placed or performed during the hospital encounter of 07/17/14 (from the past 48 hour(s))  Glucose, capillary     Status: Abnormal   Collection Time: 07/18/14  4:50 PM  Result Value Ref Range   Glucose-Capillary 298 (H) 70 - 99 mg/dL  Glucose, capillary     Status: Abnormal   Collection Time: 07/18/14 10:05 PM  Result Value Ref Range   Glucose-Capillary 316 (H) 70 - 99 mg/dL  Basic metabolic panel     Status: Abnormal   Collection Time: 07/19/14  3:29 AM  Result Value Ref Range   Sodium 138 137 - 147 mEq/L   Potassium 5.0 3.7 - 5.3 mEq/L   Chloride 106 96 - 112 mEq/L   CO2 21 19 - 32 mEq/L   Glucose, Bld 289 (H) 70 - 99 mg/dL   BUN 27 (H) 6 - 23 mg/dL   Creatinine, Ser 1.47 (H) 0.50 - 1.35 mg/dL   Calcium 9.0 8.4 - 10.5 mg/dL   GFR calc non Af Amer 41 (L) >90 mL/min   GFR calc Af Amer 48 (L) >90 mL/min    Comment: (NOTE) The eGFR has been calculated using the CKD EPI equation. This calculation has not been validated in  all clinical situations. eGFR's persistently <90 mL/min signify possible Chronic Kidney Disease.    Anion gap 11 5 - 15  CBC     Status: Abnormal   Collection Time: 07/19/14  3:29 AM  Result Value Ref Range   WBC 9.6 4.0 - 10.5 K/uL   RBC 3.71 (L) 4.22 - 5.81 MIL/uL   Hemoglobin 10.3 (L) 13.0 - 17.0 g/dL   HCT 32.8 (L) 39.0 - 52.0 %   MCV 88.4 78.0 - 100.0 fL   MCH 27.8 26.0 - 34.0 pg  MCHC 31.4 30.0 - 36.0 g/dL   RDW 13.9 11.5 - 15.5 %   Platelets 176 150 - 400 K/uL  Glucose, capillary     Status: Abnormal   Collection Time: 07/19/14  7:48 AM  Result Value Ref Range   Glucose-Capillary 204 (H) 70 - 99 mg/dL   Comment 1 Documented in Chart    Comment 2 Notify RN   Glucose, capillary     Status: Abnormal   Collection Time: 07/19/14 11:37 AM  Result Value Ref Range   Glucose-Capillary 321 (H) 70 - 99 mg/dL   Comment 1 Documented in Chart    Comment 2 Notify RN   Hepatic function panel     Status: Abnormal   Collection Time: 07/19/14 12:19 PM  Result Value Ref Range   Total Protein 7.3 6.0 - 8.3 g/dL   Albumin 3.2 (L) 3.5 - 5.2 g/dL   AST 16 0 - 37 U/L   ALT 12 0 - 53 U/L   Alkaline Phosphatase 103 39 - 117 U/L   Total Bilirubin 0.3 0.3 - 1.2 mg/dL   Bilirubin, Direct <0.2 0.0 - 0.3 mg/dL   Indirect Bilirubin NOT CALCULATED 0.3 - 0.9 mg/dL  Lipase, blood     Status: None   Collection Time: 07/19/14 12:19 PM  Result Value Ref Range   Lipase 23 11 - 59 U/L  Glucose, capillary     Status: Abnormal   Collection Time: 07/19/14  4:45 PM  Result Value Ref Range   Glucose-Capillary 205 (H) 70 - 99 mg/dL   Comment 1 Notify RN   Glucose, capillary     Status: Abnormal   Collection Time: 07/19/14 10:08 PM  Result Value Ref Range   Glucose-Capillary 198 (H) 70 - 99 mg/dL  Glucose, capillary     Status: None   Collection Time: 07/20/14  7:48 AM  Result Value Ref Range   Glucose-Capillary 93 70 - 99 mg/dL  Comprehensive metabolic panel     Status: Abnormal   Collection Time:  07/20/14 10:10 AM  Result Value Ref Range   Sodium 136 (L) 137 - 147 mEq/L   Potassium 4.5 3.7 - 5.3 mEq/L   Chloride 102 96 - 112 mEq/L   CO2 20 19 - 32 mEq/L   Glucose, Bld 222 (H) 70 - 99 mg/dL   BUN 19 6 - 23 mg/dL   Creatinine, Ser 1.15 0.50 - 1.35 mg/dL   Calcium 9.4 8.4 - 10.5 mg/dL   Total Protein 7.5 6.0 - 8.3 g/dL   Albumin 3.3 (L) 3.5 - 5.2 g/dL   AST 27 0 - 37 U/L   ALT 17 0 - 53 U/L   Alkaline Phosphatase 123 (H) 39 - 117 U/L   Total Bilirubin 0.4 0.3 - 1.2 mg/dL   GFR calc non Af Amer 56 (L) >90 mL/min   GFR calc Af Amer 65 (L) >90 mL/min    Comment: (NOTE) The eGFR has been calculated using the CKD EPI equation. This calculation has not been validated in all clinical situations. eGFR's persistently <90 mL/min signify possible Chronic Kidney Disease.    Anion gap 14 5 - 15  Glucose, capillary     Status: Abnormal   Collection Time: 07/20/14 11:36 AM  Result Value Ref Range   Glucose-Capillary 225 (H) 70 - 99 mg/dL     HPI : [name] is a 80 y.o. male with past medical history of diabetes, HTN and history of prostate cancer came to the  hospital because of syncope. Most of the history obtained from patient daughter [name]) at bedside. Patient for the past 2 month was showing signs of failure to thrive with generalized weakness, not able to eat or drink appropriately. This morning he syncopized in the bathroom while he was sitting in the tablet, he was found by his daughter after he spent about an hour in the bathroom. In the ED he was found to have acute extensive DVT involving the left lower extremity, his CT scan of the head was negative, and has creatinine of 1.7.   HOSPITAL COURSE:  Syncope-according to the daughter 3 episodes in the last 1 week, no history of seizures - history is unclear in regards to this - CT head negative, No acute intracranial infarct or other abnormality identified, negative  orthostatics  2-D echo showed ,Left ventricle: The  cavity size was normal. There was moderate concentric hypertrophy. Systolic function was normal. The estimated ejection fraction was in the range of 60% to 65%. Wall motion was normal; there were no regional wall motion abnormalities. Doppler parameters are consistent with abnormal left ventricular relaxation (grade 1 diastolic dysfunctionContinue telemetry  - V/Q negative, not hypoxic - noted to be orthostatic on vital signs per Heart rate - hydrate and follow orthostatics- hold Flomax for now Discussed with the daughter most likely the patient's syncopal episodes could be related to orthostatic hypotension  Weight loss Patient has had a 30 pound weight loss according to the daughter CEA mildly elevated Negative CT scan of the abdomen and pelvis  ARF on CKD -likely prerenal due to poor PO intake and use of Lasix Started on IVF , euvolemic  resume  Lasix  DVT- extensive, left leg - started on Xarelto , discussed risk versus benefit of Coumadin and elliquis, daughter agreeable to continuing Xarelto - patient admits to very poor mobility over the past few weeks with a good amount of time spent in a chair Patient still complaining of left knee swelling Severe degenerative joint disease is noted on knee x ray,  possibly may also need an MRI outpt  Called insurance for prior auth   Diabetes mellitus type 2, uncontrolled, without complications - continue Lantus   20 U ,continue  NovoLog    Essential hypertension - home meds on hold  - BP elevated- resume Lopressor   PROSTATE CANCER, HX OF Daughter confirms that patient has a urologist Would defer repeat PSA to his urologist   Discharge Exam:  Blood pressure 173/71, pulse 72, temperature 98.2 F (36.8 C), temperature source Oral, resp. rate 18, height 5' 9" (1.753 m), weight 68.889 kg (151 lb 14 oz), SpO2 100 %.   General: alert & oriented x 3,  In NAD   Cardiovascular: RRR, nl S1 s2   Respiratory: Decreased  breath sounds at the bases, scattered rhonchi, no crackles   Abdomen: soft +BS NT/ND, no masses palpable   Extremities: swelling of the left knee with mild effusion, no warmth       Discharge Instructions    Diet - low sodium heart healthy    Complete by:  As directed      Increase activity slowly    Complete by:  As directed            Follow-up Information    Follow up with [facility].   Why:  bedside commode   Contact information:   [address] [place] [address] [phone]  Follow up with [facility].   Why:  Physical and Occupational Therapy   Contact information:   [address] [place] [address] [phone]       Signed: Reyne Dumas 07/20/2014, 2:25 PM

## 2014-07-20 NOTE — Progress Notes (Signed)
Physical Therapy Treatment Patient Details Name: [name] MRN: [id number] DOB: 1925-11-02 Today's Date: 07/20/2014    History of Present Illness [name] is a 80 y.o. male with h/o DM 2, HTN, prostate cancer presenting with syncope and h/o failure to thrive over the past 2 months. He passed out while sitting on the commode and was brought to the hospital. Was found to have an extensive DVT of the LLE.    PT Comments    Pt currently with functional limitations due to decreased strength, endurance and mobility deficits. Pt will benefit from skilled PT to increase his independence and safety with mobility to allow discharge to home with home health PT and 24 hour assistance. Pt able to ambulate further than previous session with minimal fatigue and is safe with min guard using RW.    Follow Up Recommendations  Home health PT;Supervision for mobility/OOB     Equipment Recommendations  Rolling walker with 5" wheels    Recommendations for Other Services       Precautions / Restrictions Precautions Precautions: Fall Restrictions Weight Bearing Restrictions: Yes LLE Weight Bearing:  (DVT)    Mobility  Bed Mobility                  Transfers Overall transfer level: Needs assistance Equipment used: Rolling walker (2 wheeled) Transfers: Sit to/from Stand Sit to Stand: Mod assist         General transfer comment: Pt required moderate assistance standing from chair and required assistance getting trunk upright.    Ambulation/Gait Ambulation/Gait assistance: Min guard Ambulation Distance (Feet): 50 Feet Assistive device: Rolling walker (2 wheeled) Gait Pattern/deviations: Decreased stride length;Step-through pattern;Trunk flexed;Narrow base of support   Gait velocity interpretation: Below normal speed for age/gender General Gait Details: Pt required cues to upright posture with ambulation particuarly a Pt began to fatigue. Pt able to use rolling walker for  stability and needed min guard with no loss of balance during session.     Stairs            Wheelchair Mobility    Modified Rankin (Stroke Patients Only)       Balance Overall balance assessment: Needs assistance         Standing balance support: Bilateral upper extremity supported Standing balance-Leahy Scale: Poor                      Cognition Arousal/Alertness: Awake/alert Behavior During Therapy: WFL for tasks assessed/performed Overall Cognitive Status: Within Functional Limits for tasks assessed                      Exercises      General Comments General comments (skin integrity, edema, etc.): Pt continues to have pulling sensation in LLE that is not painful but he stated is uncomfortable.      Pertinent Vitals/Pain Pain Assessment: No/denies pain  SaO2: 91-96% with ambulation on RA    Home Living                      Prior Function            PT Goals (current goals can now be found in the care plan section) Progress towards PT goals: Progressing toward goals    Frequency  Min 3X/week    PT Plan Current plan remains appropriate    Co-evaluation             End of  Session Equipment Utilized During Treatment: Gait belt Activity Tolerance: Patient tolerated treatment well Patient left: in chair;with call bell/phone within reach     Time: 1107-1126 PT Time Calculation (min): 19 min  Charges:  $Gait Training: 8-22 mins                    G CodesJearld Shines, SPT 07/20/2014, 12:12 PM

## 2014-07-20 NOTE — Plan of Care (Signed)
Problem: Phase II Progression Outcomes Goal: Therapeutic drug levels for anticoagulation Outcome: Completed/Met Date Met:  07/20/14 Goal: 02 sats trending upward/stable (PE) Outcome: Completed/Met Date Met:  07/20/14 Goal: Discharge plan established Outcome: Completed/Met Date Met:  07/20/14 Goal: Tolerating diet Outcome: Completed/Met Date Met:  07/20/14 Goal: Other Phase II Outcomes/Goals Outcome: Completed/Met Date Met:  07/20/14

## 2014-07-20 NOTE — Plan of Care (Signed)
Problem: Phase III Progression Outcomes Goal: 02 sats stabilized Outcome: Completed/Met Date Met:  07/20/14 Goal: Activity at appropriate level-compared to baseline (UP IN CHAIR FOR HEMODIALYSIS)  Outcome: Completed/Met Date Met:  07/20/14 Goal: Discharge plan remains appropriate-arrangements made Outcome: Completed/Met Date Met:  07/20/14 Goal: Other Phase III Outcomes/Goals Outcome: Completed/Met Date Met:  07/20/14     

## 2014-07-20 NOTE — Plan of Care (Signed)
Problem: Discharge Progression Outcomes Goal: Barriers To Progression Addressed/Resolved Outcome: Completed/Met Date Met:  07/20/14 Goal: Discharge plan in place and appropriate Outcome: Completed/Met Date Met:  07/20/14 Goal: Pain controlled with appropriate interventions Outcome: Completed/Met Date Met:  07/20/14 Goal: Hemodynamically stable Outcome: Completed/Met Date Met:  28/90/22 Goal: Complications resolved/controlled Outcome: Completed/Met Date Met:  07/20/14 Goal: Tolerating diet Outcome: Completed/Met Date Met:  07/20/14 Goal: Activity appropriate for discharge plan Outcome: Completed/Met Date Met:  07/20/14 Goal: Other Discharge Outcomes/Goals Outcome: Completed/Met Date Met:  07/20/14

## 2014-07-23 ENCOUNTER — Other Ambulatory Visit: Payer: Self-pay | Admitting: Interventional Cardiology

## 2014-07-28 ENCOUNTER — Other Ambulatory Visit: Payer: Self-pay | Admitting: Interventional Cardiology

## 2014-08-02 ENCOUNTER — Telehealth: Payer: Self-pay | Admitting: Oncology

## 2014-08-02 NOTE — Telephone Encounter (Signed)
LEFT MESSAGE FOR PATIENT TO RETURN CALL TO SCHEDULE NP APPT.  °

## 2014-08-03 ENCOUNTER — Telehealth: Payer: Self-pay | Admitting: Oncology

## 2014-08-03 NOTE — Telephone Encounter (Signed)
S/W PATIENT DTR(KAREN) AND GAVE NP APPT FOR 11/24 @ 10:30 W/DR.SHADAD.  REFERRING DR. Bazile Mills PACKET MAILED

## 2014-08-09 ENCOUNTER — Telehealth: Payer: Self-pay | Admitting: Oncology

## 2014-08-09 ENCOUNTER — Telehealth: Payer: Self-pay | Admitting: Medical Oncology

## 2014-08-09 NOTE — Telephone Encounter (Signed)
Confirmed tomorrow's appt with daughter. No questions at this time.

## 2014-08-09 NOTE — Telephone Encounter (Signed)
LEFT MESSAGE FOR JUDY @ [facility] TO FAX OVER MEDICAL RECORDS.

## 2014-08-09 NOTE — Telephone Encounter (Signed)
Left message with [facility] @ [facility] requesting Urology notes to be faxed to our office.

## 2014-08-10 ENCOUNTER — Telehealth: Payer: Self-pay | Admitting: Oncology

## 2014-08-10 ENCOUNTER — Encounter: Payer: Self-pay | Admitting: Oncology

## 2014-08-10 ENCOUNTER — Ambulatory Visit (HOSPITAL_BASED_OUTPATIENT_CLINIC_OR_DEPARTMENT_OTHER): Payer: Medicare Other | Admitting: Oncology

## 2014-08-10 ENCOUNTER — Other Ambulatory Visit: Payer: Medicare Other

## 2014-08-10 ENCOUNTER — Ambulatory Visit: Payer: Medicare Other

## 2014-08-10 VITALS — BP 137/77 | HR 84 | Temp 99.0°F | Resp 17 | Ht 69.0 in | Wt 159.3 lb

## 2014-08-10 DIAGNOSIS — I824Z2 Acute embolism and thrombosis of unspecified deep veins of left distal lower extremity: Secondary | ICD-10-CM

## 2014-08-10 DIAGNOSIS — Z8546 Personal history of malignant neoplasm of prostate: Secondary | ICD-10-CM

## 2014-08-10 DIAGNOSIS — R627 Adult failure to thrive: Secondary | ICD-10-CM

## 2014-08-10 DIAGNOSIS — R140 Abdominal distension (gaseous): Secondary | ICD-10-CM

## 2014-08-10 DIAGNOSIS — R634 Abnormal weight loss: Secondary | ICD-10-CM

## 2014-08-10 NOTE — Progress Notes (Signed)
Please see consult note.  

## 2014-08-10 NOTE — Telephone Encounter (Signed)
Gave avs & cal for Jan 2016. °

## 2014-08-10 NOTE — Consult Note (Signed)
Reason for Referral: Prostate cancer, evaluation for malignancy.   HPI: [name] is a pleasant 80 year old gentleman native [place], [place]. He currently lives with his daughter who attends to most of his activities of daily living. He is a gentleman with a history of diabetes and hyperlipidemia and a history of prostate cancer dates back to 2002. At that time he presented with a PSA of 23 and was treated with external beam radiation as a definitive modality. He is followed intermittently by Dr. Diona Perkins  and his PSA have been reasonably controlled. Most recently, he was hospitalized between 07/17/2014 and 07/20/2014 for syncope, weakness and found to have deep vein thrombosis. Leading up to that hospitalization, he been complaining with weight loss and failure to thrive and suspicious for malignancy. While he is in the hospital, he was evaluated with imaging studies including MRI of the brain which did not show any acute abnormalities. He also had a CT scan of the abdomen and pelvis and showed no malignancy. He also had a VQ scan which ruled out a pulmonary embolism. His most recent PSA was noted to be elevated at 9.66. He was also noted to have an elevated CA-19-9 at 285.8. Since his discharge, he still relatively weak but able to ambulate with the help of a walker. He is able to eat but feels bloated fairly quick. He does not report any fevers or chills or sweats. He does not report any headaches or seizures. He does not report any chest pain, shortness of breath, difficulty breathing. He does not report any nausea, vomiting but does report abdominal distention and early satiety. He does report urgency and mild incontinence but denies any hematuria or dysuria. He does not report any skeletal complaints. He does not report any lymphadenopathy or petechiae. Rest of his review of systems unremarkable.   Past Medical History  Diagnosis Date  . Dementia   . Hypertension   . Gout   .  Hyperlipidemia   . Fracture of lumbar spine 11-08-11  . Coronary artery disease   . Myocardial infarction 2004    NSTEMI/E-chart  . Chronic kidney disease (CKD), stage II (mild)     /E-chart  . Prostate cancer     S/P radiation  . Osteoarthritis     /E-chart  . GI bleeding 2004; 01/2011    /E-chart  . Blood transfusion   . Anemia   . Diverticulosis   . Type II diabetes mellitus   . GERD (gastroesophageal reflux disease)   . Dysrhythmia 05/01/2012    bradycardia  :  Past Surgical History  Procedure Laterality Date  . Left leg surgery  1950's    "injured in [place] war"  . Right shoulder surgery    . Left cataract extraction    . Tonsillectomy    :  Current Outpatient Prescriptions  Medication Sig Dispense Refill  . acetaminophen (TYLENOL) 500 MG tablet Take 500 mg by mouth daily as needed (pain).    Marland Kitchen allopurinol (ZYLOPRIM) 300 MG tablet Take 300 mg by mouth daily.    Marland Kitchen docusate sodium (COLACE) 100 MG capsule Take 100 mg by mouth 2 (two) times daily.    . Fe Fum-FePoly-Vit C-Vit B3 (INTEGRA) 62.5-62.5-40-3 MG CAPS Take 1 capsule by mouth daily.    . furosemide (LASIX) 40 MG tablet Take 40 mg by mouth daily.    Marland Kitchen HYDROcodone-acetaminophen (NORCO/VICODIN) 5-325 MG per tablet Take 1 tablet by mouth 3 (three) times daily as needed for severe pain. 30 tablet  0  . insulin aspart (NOVOLOG FLEXPEN) 100 UNIT/ML FlexPen Inject 10 Units into the skin 3 (three) times daily with meals.    . Insulin Glargine (LANTUS SOLOSTAR) 100 UNIT/ML Solostar Pen Inject 20 Units into the skin at bedtime.    . metoprolol tartrate (LOPRESSOR) 25 MG tablet TAKE 1/2 TABLET BY MOUTH TWICE DAILY 30 tablet 0  . mirtazapine (REMERON) 15 MG tablet Take 15 mg by mouth at bedtime.    Marland Kitchen omeprazole (PRILOSEC) 20 MG capsule Take 20 mg by mouth daily.    . rivaroxaban (XARELTO) 20 MG TABS tablet Take 1 tablet (20 mg total) by mouth daily with supper. 30 tablet 5  . sertraline (ZOLOFT) 50 MG tablet Take 50 mg by mouth  daily.    . simvastatin (ZOCOR) 20 MG tablet Take 20 mg by mouth at bedtime.    . tamsulosin (FLOMAX) 0.4 MG CAPS capsule Take 0.4 mg by mouth daily after breakfast.     No current facility-administered medications for this visit.       Allergies  Allergen Reactions  . Aspirin Other (See Comments)    Gi bleed  . Flexeril [Cyclobenzaprine] Other (See Comments)    confusion  . Ibuprofen Other (See Comments)    "if I take it for awhile it has tendency to make me nervous"  :  No family history on file.:  History   Social History  . Marital Status: Legally Separated    Spouse Name: N/A    Number of Children: N/A  . Years of Education: N/A   Occupational History  . Not on file.   Social History Main Topics  . Smoking status: Former Smoker -- 1.00 packs/day for 35 years    Types: Cigarettes  . Smokeless tobacco: Never Used     Comment: 11/16/11 "quit smoking 20-30 yr ago"  . Alcohol Use: No  . Drug Use: No  . Sexual Activity: No   Other Topics Concern  . Not on file   Social History Narrative  :  Pertinent items are noted in HPI.  Exam: Blood pressure 137/77, pulse 84, temperature 99 F (37.2 C), temperature source Oral, resp. rate 17, height 5\' 9"  (1.753 m), weight 159 lb 4.8 oz (72.258 kg), SpO2 99 %. General appearance: alert and cooperative Head: Normocephalic, without obvious abnormality Throat: lips, mucosa, and tongue normal; teeth and gums normal Neck: no adenopathy Back: negative Resp: clear to auscultation bilaterally Chest wall: no tenderness Extremities: extremities normal, atraumatic, no cyanosis or edema Pulses: 2+ and symmetric Skin: Skin color, texture, turgor normal. No rashes or lesions    Ct Abdomen Pelvis Wo Contrast  07/19/2014   CLINICAL DATA:  Poor p.o. intake for 3 weeks.  Generalize weakness.  EXAM: CT ABDOMEN AND PELVIS WITHOUT CONTRAST  TECHNIQUE: Multidetector CT imaging of the abdomen and pelvis was performed following the  standard protocol without IV contrast.  COMPARISON:  11/08/2011.  FINDINGS: Lower chest: Atelectasis identified within both lung bases. There is no pleural effusion. Atherosclerotic calcifications involving the LAD, left circumflex and RCA coronary arteries noted.  Hepatobiliary: The liver appears normal. The gallbladder is unremarkable. There is no biliary dilatation.  Pancreas: Normal appearance of the pancreas.  Spleen: The spleen is on unremarkable.  Adrenals/Urinary Tract: The adrenal glands are both normal. Normal appearance of both kidneys. There is no nephrolithiasis or hydronephrosis. The urinary bladder appears normal.  Stomach/Bowel: The stomach appears within normal limits. The small bowel loops are unremarkable. The appendix is visualized and  appears within normal limits. Multiple mid and distal colonic diverticula identified without acute inflammation.  Vascular/Lymphatic: There is calcified atherosclerotic disease involving the abdominal aorta. No aneurysm. No retroperitoneal adenopathy. No enlarged mesenteric lymph nodes. No pelvic or inguinal adenopathy identified.  Reproductive: The prostate gland and seminal vesicles appear normal.  Other: Subcutaneous fat stranding is identified within the left buttock and lateral aspect of the left upper thigh.  Musculoskeletal: Compression fracture involving the central portion of the L1 vertebra is again identified. When compared with the previous examination this is not significantly changed.  IMPRESSION: 1. No acute findings within the abdomen or pelvis. 2. Atherosclerotic disease. 3. Similar appearance of L1 compression fracture when compared with MR from 11/09/2011.   Electronically Signed   By: Kerby Moors M.D.   On: 07/19/2014 15:04      Mr Brain Wo Contrast  07/19/2014   CLINICAL DATA:  Initial evaluation for syncope. Three episodes in past week.  EXAM: MRI HEAD WITHOUT CONTRAST  TECHNIQUE: Multiplanar, multiecho pulse sequences of the brain  and surrounding structures were obtained without intravenous contrast.  COMPARISON:  Prior CT from 07/17/2014  FINDINGS: Diffuse prominence of the CSF containing spaces is compatible with generalized age-related cerebral atrophy. Minimal small vessel changes present within the periventricular and deep white matter both cerebral hemispheres.  No mass lesion or midline shift. Ventricular prominence present related to global parenchymal volume loss without hydrocephalus. No extra-axial fluid collection.  No abnormal foci of restricted diffusion to suggest acute intracranial infarct. Gray-white matter differentiation maintained. Normal flow voids seen within the intracranial vasculature.  Craniocervical junction widely patent. Prominent pannus with erosive changes seen about the dens. Pituitary gland normal. No acute abnormality seen about the orbits.  Visualized bone marrow signal intensity within normal limits. Scalp soft tissues unremarkable.  Paranasal sinuses and mastoid air cells are clear.  IMPRESSION: 1. No acute intracranial infarct or other abnormality identified. 2. Global age-related cerebral atrophy.   Electronically Signed   By: Jeannine Boga M.D.   On: 07/19/2014 22:22   Nm Pulmonary Perf And Vent  07/17/2014   CLINICAL DATA:  Syncopal episode  EXAM: NUCLEAR MEDICINE VENTILATION - PERFUSION LUNG SCAN  TECHNIQUE: Ventilation images were obtained in multiple projections using inhaled aerosol technetium 99 M DTPA. Perfusion images were obtained in multiple projections after intravenous injection of Tc-23m MAA.  RADIOPHARMACEUTICALS:  Forty mCi Tc-2m DTPA aerosol and 6 mCi Tc-33m MAA  COMPARISON:  Chest x-ray from earlier in the same day  FINDINGS: Ventilation: No focal ventilation defect.  Perfusion: No wedge shaped peripheral perfusion defects to suggest acute pulmonary embolism.  IMPRESSION: No evidence of pulmonary embolism.   Electronically Signed   By: Inez Catalina M.D.   On: 07/17/2014  18:21    Assessment and Plan:   80 year old gentleman with the following issues:  1. Prostate cancer diagnosed in 2002. He was treated with external beam radiation for a PSA of 23. His most recent PSA on October 31 was up to 9.66. He is currently under evaluation by Dr. Diona Perkins for possible local recurrence. I see no signs of systemic recurrence on his imaging studies that included a CT scan. I doubt that his constitutional symptoms as related to prostate cancer recurrence. I do agree with repeating a bone scan to ensure that he does not have any disease at this time.  2. Weight loss, failure to thrive, weakness and a questionable malignancy. His imaging studies failed to prove this at this time. He does  have an elevated CA-19-9 which is not very specific. His CT scan did not show any evidence of upper GI malignancy but his CT scan was without contrast. He does have some occasional abdominal bloating and I recommend GI workup if the symptoms continue. I see no clear cut evidence of malignancy or any indication for any treatment at this time.  3. Deep vein thrombosis: Recently diagnosed and he is currently on Xarelto. His VQ scan was reviewed and showed no evidence of a pulmonary embolism. This could suggest an occult malignancy but no evidence of that at this time noted based on extensive imaging studies.  All his questions and his family's questions were answered today and I will see him in follow-up in 2 months to follow on his progress.

## 2014-08-10 NOTE — Progress Notes (Signed)
Checked in new patient with no issues prior to seeing the dr. He has appt card and had not been out of the country.

## 2014-08-18 ENCOUNTER — Other Ambulatory Visit: Payer: Self-pay | Admitting: Interventional Cardiology

## 2014-08-19 NOTE — Telephone Encounter (Signed)
Please advise on refill. Patient still has not been seen and he had appointments scheduled but he cancelled and no-showed. Thanks, MI

## 2014-09-06 ENCOUNTER — Other Ambulatory Visit (HOSPITAL_COMMUNITY): Payer: Self-pay | Admitting: Urology

## 2014-09-06 DIAGNOSIS — C61 Malignant neoplasm of prostate: Secondary | ICD-10-CM

## 2014-09-16 ENCOUNTER — Ambulatory Visit (HOSPITAL_COMMUNITY): Payer: Medicare Other

## 2014-09-16 ENCOUNTER — Encounter (HOSPITAL_COMMUNITY): Payer: Medicare Other

## 2014-09-22 ENCOUNTER — Other Ambulatory Visit: Payer: Self-pay | Admitting: Interventional Cardiology

## 2014-09-23 NOTE — Telephone Encounter (Signed)
Please advise on refill. Patient still has not scheduled an appointment. Thanks, MI 

## 2014-09-27 ENCOUNTER — Encounter (HOSPITAL_COMMUNITY)
Admission: RE | Admit: 2014-09-27 | Discharge: 2014-09-27 | Disposition: A | Discharge status: Home / Self Care | Payer: Medicare Other | Source: Ambulatory Visit | Attending: Urology | Admitting: Urology

## 2014-09-27 ENCOUNTER — Ambulatory Visit (HOSPITAL_COMMUNITY)
Admission: RE | Admit: 2014-09-27 | Discharge: 2014-09-27 | Disposition: A | Discharge status: Home / Self Care | Payer: Medicare Other | Source: Ambulatory Visit | Attending: Urology | Admitting: Urology

## 2014-09-27 DIAGNOSIS — C61 Malignant neoplasm of prostate: Secondary | ICD-10-CM | POA: Insufficient documentation

## 2014-09-27 DIAGNOSIS — M545 Low back pain: Secondary | ICD-10-CM | POA: Insufficient documentation

## 2014-09-27 MED ORDER — TECHNETIUM TC 99M MEDRONATE IV KIT
25.7000 | PACK | Freq: Once | INTRAVENOUS | Status: AC | PRN
  Administered 2014-09-27: 25.7 via INTRAVENOUS

## 2014-10-05 ENCOUNTER — Ambulatory Visit: Payer: Medicare Other | Admitting: Oncology

## 2014-10-10 ENCOUNTER — Observation Stay (HOSPITAL_COMMUNITY)
Admission: EM | Admit: 2014-10-10 | Discharge: 2014-10-12 | Disposition: A | Discharge status: Home / Self Care | Payer: Medicare Other | Attending: Internal Medicine | Admitting: Internal Medicine

## 2014-10-10 ENCOUNTER — Emergency Department (HOSPITAL_COMMUNITY): Payer: Medicare Other

## 2014-10-10 ENCOUNTER — Encounter (HOSPITAL_COMMUNITY): Payer: Self-pay

## 2014-10-10 DIAGNOSIS — E1165 Type 2 diabetes mellitus with hyperglycemia: Secondary | ICD-10-CM | POA: Diagnosis not present

## 2014-10-10 DIAGNOSIS — Z794 Long term (current) use of insulin: Secondary | ICD-10-CM | POA: Diagnosis not present

## 2014-10-10 DIAGNOSIS — D649 Anemia, unspecified: Secondary | ICD-10-CM | POA: Insufficient documentation

## 2014-10-10 DIAGNOSIS — N183 Chronic kidney disease, stage 3 unspecified: Secondary | ICD-10-CM | POA: Diagnosis present

## 2014-10-10 DIAGNOSIS — E785 Hyperlipidemia, unspecified: Secondary | ICD-10-CM | POA: Diagnosis not present

## 2014-10-10 DIAGNOSIS — I499 Cardiac arrhythmia, unspecified: Secondary | ICD-10-CM | POA: Diagnosis not present

## 2014-10-10 DIAGNOSIS — R627 Adult failure to thrive: Secondary | ICD-10-CM | POA: Diagnosis present

## 2014-10-10 DIAGNOSIS — Z7902 Long term (current) use of antithrombotics/antiplatelets: Secondary | ICD-10-CM | POA: Diagnosis not present

## 2014-10-10 DIAGNOSIS — R531 Weakness: Secondary | ICD-10-CM

## 2014-10-10 DIAGNOSIS — K5790 Diverticulosis of intestine, part unspecified, without perforation or abscess without bleeding: Secondary | ICD-10-CM | POA: Diagnosis not present

## 2014-10-10 DIAGNOSIS — Z79899 Other long term (current) drug therapy: Secondary | ICD-10-CM | POA: Insufficient documentation

## 2014-10-10 DIAGNOSIS — N179 Acute kidney failure, unspecified: Secondary | ICD-10-CM | POA: Diagnosis not present

## 2014-10-10 DIAGNOSIS — F0390 Unspecified dementia without behavioral disturbance: Secondary | ICD-10-CM | POA: Insufficient documentation

## 2014-10-10 DIAGNOSIS — F329 Major depressive disorder, single episode, unspecified: Secondary | ICD-10-CM | POA: Diagnosis present

## 2014-10-10 DIAGNOSIS — Z8546 Personal history of malignant neoplasm of prostate: Secondary | ICD-10-CM | POA: Insufficient documentation

## 2014-10-10 DIAGNOSIS — I82402 Acute embolism and thrombosis of unspecified deep veins of left lower extremity: Secondary | ICD-10-CM

## 2014-10-10 DIAGNOSIS — E860 Dehydration: Secondary | ICD-10-CM | POA: Diagnosis present

## 2014-10-10 DIAGNOSIS — M1990 Unspecified osteoarthritis, unspecified site: Secondary | ICD-10-CM | POA: Diagnosis not present

## 2014-10-10 DIAGNOSIS — I2510 Atherosclerotic heart disease of native coronary artery without angina pectoris: Secondary | ICD-10-CM | POA: Insufficient documentation

## 2014-10-10 DIAGNOSIS — K219 Gastro-esophageal reflux disease without esophagitis: Secondary | ICD-10-CM | POA: Diagnosis not present

## 2014-10-10 DIAGNOSIS — Z8781 Personal history of (healed) traumatic fracture: Secondary | ICD-10-CM | POA: Diagnosis not present

## 2014-10-10 DIAGNOSIS — C61 Malignant neoplasm of prostate: Secondary | ICD-10-CM | POA: Diagnosis present

## 2014-10-10 DIAGNOSIS — I82409 Acute embolism and thrombosis of unspecified deep veins of unspecified lower extremity: Secondary | ICD-10-CM | POA: Diagnosis present

## 2014-10-10 DIAGNOSIS — I129 Hypertensive chronic kidney disease with stage 1 through stage 4 chronic kidney disease, or unspecified chronic kidney disease: Secondary | ICD-10-CM | POA: Diagnosis not present

## 2014-10-10 DIAGNOSIS — F32A Depression, unspecified: Secondary | ICD-10-CM | POA: Diagnosis present

## 2014-10-10 DIAGNOSIS — I252 Old myocardial infarction: Secondary | ICD-10-CM | POA: Diagnosis not present

## 2014-10-10 DIAGNOSIS — R630 Anorexia: Principal | ICD-10-CM

## 2014-10-10 DIAGNOSIS — R739 Hyperglycemia, unspecified: Secondary | ICD-10-CM

## 2014-10-10 DIAGNOSIS — M109 Gout, unspecified: Secondary | ICD-10-CM | POA: Diagnosis not present

## 2014-10-10 DIAGNOSIS — E118 Type 2 diabetes mellitus with unspecified complications: Secondary | ICD-10-CM

## 2014-10-10 DIAGNOSIS — Z87891 Personal history of nicotine dependence: Secondary | ICD-10-CM | POA: Diagnosis not present

## 2014-10-10 DIAGNOSIS — N182 Chronic kidney disease, stage 2 (mild): Secondary | ICD-10-CM | POA: Insufficient documentation

## 2014-10-10 DIAGNOSIS — Z515 Encounter for palliative care: Secondary | ICD-10-CM

## 2014-10-10 DIAGNOSIS — IMO0001 Reserved for inherently not codable concepts without codable children: Secondary | ICD-10-CM | POA: Diagnosis present

## 2014-10-10 LAB — CBC WITH DIFFERENTIAL/PLATELET
BASOS ABS: 0 10*3/uL (ref 0.0–0.1)
BASOS PCT: 0 % (ref 0–1)
EOS PCT: 3 % (ref 0–5)
Eosinophils Absolute: 0.2 10*3/uL (ref 0.0–0.7)
HCT: 36.9 % — ABNORMAL LOW (ref 39.0–52.0)
HEMOGLOBIN: 11.5 g/dL — AB (ref 13.0–17.0)
LYMPHS ABS: 2 10*3/uL (ref 0.7–4.0)
LYMPHS PCT: 25 % (ref 12–46)
MCH: 28.1 pg (ref 26.0–34.0)
MCHC: 31.2 g/dL (ref 30.0–36.0)
MCV: 90.2 fL (ref 78.0–100.0)
Monocytes Absolute: 0.7 10*3/uL (ref 0.1–1.0)
Monocytes Relative: 8 % (ref 3–12)
NEUTROS ABS: 5.2 10*3/uL (ref 1.7–7.7)
NEUTROS PCT: 64 % (ref 43–77)
Platelets: 251 10*3/uL (ref 150–400)
RBC: 4.09 MIL/uL — ABNORMAL LOW (ref 4.22–5.81)
RDW: 14.4 % (ref 11.5–15.5)
WBC: 8.1 10*3/uL (ref 4.0–10.5)

## 2014-10-10 LAB — BASIC METABOLIC PANEL
ANION GAP: 9 (ref 5–15)
BUN: 36 mg/dL — ABNORMAL HIGH (ref 6–23)
CALCIUM: 9.3 mg/dL (ref 8.4–10.5)
CO2: 22 mmol/L (ref 19–32)
Chloride: 101 mmol/L (ref 96–112)
Creatinine, Ser: 1.91 mg/dL — ABNORMAL HIGH (ref 0.50–1.35)
GFR calc Af Amer: 35 mL/min — ABNORMAL LOW (ref >90)
GFR calc non Af Amer: 30 mL/min — ABNORMAL LOW (ref >90)
Glucose, Bld: 393 mg/dL — ABNORMAL HIGH (ref 70–99)
POTASSIUM: 4 mmol/L (ref 3.5–5.1)
Sodium: 132 mmol/L — ABNORMAL LOW (ref 135–145)

## 2014-10-10 LAB — URINE MICROSCOPIC-ADD ON

## 2014-10-10 LAB — URINALYSIS, ROUTINE W REFLEX MICROSCOPIC
BILIRUBIN URINE: NEGATIVE
Glucose, UA: 250 mg/dL — AB
Hgb urine dipstick: NEGATIVE
Ketones, ur: NEGATIVE mg/dL
Leukocytes, UA: NEGATIVE
Nitrite: NEGATIVE
PH: 6 (ref 5.0–8.0)
PROTEIN: 100 mg/dL — AB
Specific Gravity, Urine: 1.016 (ref 1.005–1.030)
UROBILINOGEN UA: 0.2 mg/dL (ref 0.0–1.0)

## 2014-10-10 LAB — GLUCOSE, CAPILLARY: GLUCOSE-CAPILLARY: 341 mg/dL — AB (ref 70–99)

## 2014-10-10 LAB — PHOSPHORUS: Phosphorus: 3.2 mg/dL (ref 2.3–4.6)

## 2014-10-10 LAB — HEMOGLOBIN A1C
HEMOGLOBIN A1C: 10.5 % — AB (ref <5.7)
Mean Plasma Glucose: 255 mg/dL — ABNORMAL HIGH (ref <117)

## 2014-10-10 LAB — MAGNESIUM: Magnesium: 1.8 mg/dL (ref 1.5–2.5)

## 2014-10-10 LAB — CBG MONITORING, ED: Glucose-Capillary: 335 mg/dL — ABNORMAL HIGH (ref 70–99)

## 2014-10-10 MED ORDER — GLUCERNA SHAKE PO LIQD
237.0000 mL | Freq: Three times a day (TID) | ORAL | Status: DC
  Administered 2014-10-10 – 2014-10-12 (×5): 237 mL via ORAL
  Filled 2014-10-10 (×7): qty 237

## 2014-10-10 MED ORDER — METOPROLOL TARTRATE 12.5 MG HALF TABLET
12.5000 mg | ORAL_TABLET | Freq: Two times a day (BID) | ORAL | Status: DC
  Administered 2014-10-10 – 2014-10-12 (×4): 12.5 mg via ORAL
  Filled 2014-10-10 (×5): qty 1

## 2014-10-10 MED ORDER — ACETAMINOPHEN 325 MG PO TABS: 650.0000 mg | ORAL_TABLET | Freq: Four times a day (QID) | ORAL | Status: DC | PRN

## 2014-10-10 MED ORDER — SODIUM CHLORIDE 0.9 % IV SOLN
INTRAVENOUS | Status: DC
  Administered 2014-10-10 – 2014-10-12 (×4): via INTRAVENOUS

## 2014-10-10 MED ORDER — ONDANSETRON HCL 4 MG PO TABS: 4.0000 mg | ORAL_TABLET | Freq: Four times a day (QID) | ORAL | Status: DC | PRN

## 2014-10-10 MED ORDER — INSULIN GLARGINE 100 UNIT/ML ~~LOC~~ SOLN
10.0000 [IU] | Freq: Every day | SUBCUTANEOUS | Status: DC
  Administered 2014-10-10 – 2014-10-11 (×2): 10 [IU] via SUBCUTANEOUS
  Filled 2014-10-10 (×4): qty 0.1

## 2014-10-10 MED ORDER — ONDANSETRON HCL 4 MG/2ML IJ SOLN: 4.0000 mg | Freq: Four times a day (QID) | INTRAMUSCULAR | Status: DC | PRN

## 2014-10-10 MED ORDER — HYDROCODONE-ACETAMINOPHEN 5-325 MG PO TABS: 1.0000 | ORAL_TABLET | Freq: Three times a day (TID) | ORAL | Status: DC | PRN

## 2014-10-10 MED ORDER — INSULIN ASPART 100 UNIT/ML ~~LOC~~ SOLN: 0.0000 [IU] | Freq: Three times a day (TID) | SUBCUTANEOUS | Status: DC

## 2014-10-10 MED ORDER — DOCUSATE SODIUM 100 MG PO CAPS
100.0000 mg | ORAL_CAPSULE | Freq: Two times a day (BID) | ORAL | Status: DC
  Administered 2014-10-10 – 2014-10-12 (×4): 100 mg via ORAL
  Filled 2014-10-10 (×5): qty 1

## 2014-10-10 MED ORDER — ALLOPURINOL 300 MG PO TABS
300.0000 mg | ORAL_TABLET | Freq: Every day | ORAL | Status: DC
  Administered 2014-10-11 – 2014-10-12 (×2): 300 mg via ORAL
  Filled 2014-10-10 (×2): qty 1

## 2014-10-10 MED ORDER — MIRTAZAPINE 7.5 MG PO TABS
7.5000 mg | ORAL_TABLET | Freq: Every day | ORAL | Status: DC
  Administered 2014-10-10 – 2014-10-11 (×2): 7.5 mg via ORAL
  Filled 2014-10-10 (×3): qty 1

## 2014-10-10 MED ORDER — PANTOPRAZOLE SODIUM 40 MG PO TBEC
40.0000 mg | DELAYED_RELEASE_TABLET | Freq: Every day | ORAL | Status: DC
Start: 2014-10-11 — End: 2014-10-12
  Administered 2014-10-11 – 2014-10-12 (×2): 40 mg via ORAL
  Filled 2014-10-10 (×2): qty 1

## 2014-10-10 MED ORDER — RIVAROXABAN 20 MG PO TABS
20.0000 mg | ORAL_TABLET | Freq: Every day | ORAL | Status: DC
  Administered 2014-10-11 – 2014-10-12 (×2): 20 mg via ORAL
  Filled 2014-10-10 (×2): qty 1

## 2014-10-10 MED ORDER — INSULIN ASPART 100 UNIT/ML ~~LOC~~ SOLN
0.0000 [IU] | Freq: Three times a day (TID) | SUBCUTANEOUS | Status: DC
  Administered 2014-10-11: 8 [IU] via SUBCUTANEOUS
  Administered 2014-10-11: 11 [IU] via SUBCUTANEOUS
  Administered 2014-10-11: 8 [IU] via SUBCUTANEOUS
  Administered 2014-10-12: 5 [IU] via SUBCUTANEOUS
  Administered 2014-10-12: 3 [IU] via SUBCUTANEOUS

## 2014-10-10 MED ORDER — TAMSULOSIN HCL 0.4 MG PO CAPS
0.4000 mg | ORAL_CAPSULE | Freq: Every day | ORAL | Status: DC
  Administered 2014-10-11 – 2014-10-12 (×2): 0.4 mg via ORAL
  Filled 2014-10-10 (×3): qty 1

## 2014-10-10 MED ORDER — ACETAMINOPHEN 650 MG RE SUPP: 650.0000 mg | Freq: Four times a day (QID) | RECTAL | Status: DC | PRN

## 2014-10-10 MED ORDER — INSULIN ASPART 100 UNIT/ML ~~LOC~~ SOLN
0.0000 [IU] | Freq: Every day | SUBCUTANEOUS | Status: DC
  Administered 2014-10-10: 4 [IU] via SUBCUTANEOUS
  Administered 2014-10-11: 2 [IU] via SUBCUTANEOUS

## 2014-10-10 MED ORDER — SERTRALINE HCL 50 MG PO TABS
50.0000 mg | ORAL_TABLET | Freq: Every day | ORAL | Status: DC
Start: 2014-10-11 — End: 2014-10-12
  Administered 2014-10-11 – 2014-10-12 (×2): 50 mg via ORAL
  Filled 2014-10-10 (×2): qty 1

## 2014-10-10 NOTE — H&P (Signed)
[facility] History and Physical  [name] OTR:[id number] DOB: 23-Apr-1926 DOA: 10/10/2014  Referring physician: Dr. Eulis Foster  PCP: Jeff Coma, MD   Chief Complaint: generalized weakness, anorexia and dehydration.  HPI: [name] is a 81 y.o. male with PMH significant for HTN, CKD stage 3, DM type 2 (on insulin); hx of dementia, HLD, GERD, gout, protein calorie malnutrition and depression. Who presented to ED with complaints of generalized weakness and anorexia. Patient denies any CP, SOB, fever, chills, nausea, vomiting, hematuria, melena, hematochezia or any other complaints. Family reports that he has not been eating for approx 2 weeks and that his appetite is gone. They has continue giving his medications despite poor PO intake. In ED workup demonstrated Acute on chronic renal failure; hyperglycemia (393); physical findings of dehydration; UA w/o UTI, no fever and no elevated WBC's.  [facility] called to admit patient for failure to thrive, dehydration and ARF. Patient started on IVF's resuscitation.    Review of Systems:  Negative except as otherwise mentioned on HPI.  Past Medical History  Diagnosis Date  . Dementia   . Hypertension   . Gout   . Hyperlipidemia   . Fracture of lumbar spine 11-08-11  . Coronary artery disease   . Myocardial infarction 2004    NSTEMI/E-chart  . Chronic kidney disease (CKD), stage II (mild)     /E-chart  . Prostate cancer     S/P radiation  . Osteoarthritis     /E-chart  . GI bleeding 2004; 01/2011    /E-chart  . Blood transfusion   . Anemia   . Diverticulosis   . Type II diabetes mellitus   . GERD (gastroesophageal reflux disease)   . Dysrhythmia 05/01/2012    bradycardia   Past Surgical History  Procedure Laterality Date  . Left leg surgery  1950's    "injured in [place] war"  . Right shoulder surgery    . Left cataract extraction    . Tonsillectomy     Social History:  reports that he has quit smoking. His smoking use  included Cigarettes. He has a 35 pack-year smoking history. He has never used smokeless tobacco. He reports that he does not drink alcohol or use illicit drugs.  Allergies  Allergen Reactions  . Aspirin Other (See Comments)    Gi bleed  . Flexeril [Cyclobenzaprine] Other (See Comments)    confusion  . Ibuprofen Other (See Comments)    "if I take it for awhile it has tendency to make me nervous"    Family HX: positive for HTN, HLD an diabetes.  Prior to Admission medications   Medication Sig Start Date End Date Taking? Authorizing Provider  allopurinol (ZYLOPRIM) 300 MG tablet Take 300 mg by mouth daily.   Yes Historical Provider, MD  HYDROcodone-acetaminophen (NORCO/VICODIN) 5-325 MG per tablet Take 1 tablet by mouth 3 (three) times daily as needed for severe pain. Patient taking differently: Take 1 tablet by mouth daily as needed for severe pain.  07/20/14  Yes Reyne Dumas, MD  insulin aspart (NOVOLOG FLEXPEN) 100 UNIT/ML FlexPen Inject 10 Units into the skin 3 (three) times daily with meals.   Yes Historical Provider, MD  Insulin Glargine (LANTUS SOLOSTAR) 100 UNIT/ML Solostar Pen Inject 20 Units into the skin at bedtime.   Yes Historical Provider, MD  metoprolol tartrate (LOPRESSOR) 25 MG tablet TAKE 1/2 TABLET BY MOUTH TWICE DAILY 07/23/14  Yes Sueanne Margarita, MD  mirtazapine (REMERON) 15 MG tablet Take 15 mg by  mouth at bedtime.   Yes Historical Provider, MD  omeprazole (PRILOSEC) 40 MG capsule Take 40 mg by mouth daily.   Yes Historical Provider, MD  rivaroxaban (XARELTO) 20 MG TABS tablet Take 1 tablet (20 mg total) by mouth daily with supper. 08/08/14  Yes Reyne Dumas, MD  sertraline (ZOLOFT) 50 MG tablet Take 50 mg by mouth daily.   Yes Historical Provider, MD  tamsulosin (FLOMAX) 0.4 MG CAPS capsule Take 0.4 mg by mouth daily after breakfast.   Yes Historical Provider, MD  acetaminophen (TYLENOL) 500 MG tablet Take 500 mg by mouth daily as needed (pain).    Historical Provider,  MD  docusate sodium (COLACE) 100 MG capsule Take 100 mg by mouth 2 (two) times daily.    Historical Provider, MD  Fe Fum-FePoly-Vit C-Vit B3 (INTEGRA) 62.5-62.5-40-3 MG CAPS Take 1 capsule by mouth daily.    Historical Provider, MD  furosemide (LASIX) 40 MG tablet TAKE 1 TABLET BY MOUTH DAILY. NEED AN APPOINTMENT FOR FURTHER REFILLS Patient not taking: Reported on 10/10/2014 09/27/14   Belva Crome III, MD  omeprazole (PRILOSEC) 20 MG capsule Take 20 mg by mouth daily.    Historical Provider, MD   Physical Exam: Filed Vitals:   10/10/14 1307  BP: 144/91  Pulse: 81  Temp: 98.2 F (36.8 C)  TempSrc: Oral  Resp: 18  SpO2: 100%    Wt Readings from Last 3 Encounters:  08/10/14 72.258 kg (159 lb 4.8 oz)  07/20/14 68.889 kg (151 lb 14 oz)  05/04/12 63.504 kg (140 lb)    General:  AAOX3, no fever and in not distress. Patient reports to be tire and feeling weak. No CP or SOB. Thin and slightly underweight by height. Eyes: PERRL, normal lids, irises & conjunctiva, no icterus ENT: hard of hearing, no thrush, no erythema or exudates. Drip MM and cracked lips. No ears or nostrils drainage Neck: no LAD, masses or thyromegaly; no JVD Cardiovascular: RRR, No LE edema. No rubs or gallops; no murmurs  Respiratory: CTA bilaterally, no w/r/r. Normal respiratory effort. Abdomen: soft, nt, nd; positive BS Skin: no open wound, no rash or induration seen on exam Musculoskeletal: grossly normal tone BUE/BLE Psychiatric: depressed/flat affect. Neurologic: generalized weakness due to poor effort; no focal deficit. AAOX3          Labs on Admission:  Basic Metabolic Panel:  Recent Labs Lab 10/10/14 1417  NA 132*  K 4.0  CL 101  CO2 22  GLUCOSE 393*  BUN 36*  CREATININE 1.91*  CALCIUM 9.3   CBC:  Recent Labs Lab 10/10/14 1417  WBC 8.1  NEUTROABS 5.2  HGB 11.5*  HCT 36.9*  MCV 90.2  PLT 251   Radiological Exams on Admission: CXR ordered and pending  EKG:   None  Assessment/Plan 1-Acute renal failure superimposed on stage 3 chronic kidney disease: secondary to pre-renal azotemia and continue use of lasix. Patient with approx 2 weeks of anorexia and poor intake -will admit to med-surg bed -provide IVF's resuscitation -stop lasix -follow clinical response -no signs of UTI -if renal failure failed to improved will check [place] (to r/o obstruction giving hx of BPH and prostate cancer; but family reporting patient is urinating w/o problems)  2-Diabetes mellitus type 2, uncontrolled, without complications: will continue lantus and SSI. Dose to be adjusted base on patient PO intake. -will check A1C  3-GERD: continue PPI  4-Dehydration and failure to thrive: due to poor PO intake. No new patient declining and with really  poor appetite continue feeding supplements -provide IVF's -family interested in discussing hospice care and not looking for artificial intervention -nutrition service contacted -diet liberalize -will check CXR to r/o underline infection   5-Hs of Prostate cancer and BPH -continue flomax  6-Generalized weakness: will ask PT to seehim -due to anorexia, deconditioning and dehydration  7-DVT of leg (deep venous thrombosis): will continue xarelto for now -needs to discussed with PCP/PC [place] of long term anticoagulation, especially with recurrent ARF for dehydration  8-Depression:continue zoloft and low dose remeron (last one mainly for appetite)   Palliative care: to help with advance directives; MUST forms and assessing if patient candidate for hospice at home.  Code Status: DNR DVT Prophylaxis:SCD's Family Communication: daughters and grandsons at bedside Disposition Plan: observation, LOS < 2 midnights; med-surg bed  Time spent: 78 minutes  Barton Dubois [facility] Pager [phone]

## 2014-10-10 NOTE — ED Notes (Signed)
Per EMS, pt from home.  Pt has not been eating for 2 weeks.  Notified MD and told to give ensure.  Pt not willing to drink the ensure.  MD notified to bring to hospital.  Pt has Yellow DNR.  Pt has DM.  Pt alert and oriented x 3.  Vitals:  140/80, hr 85, sats 95%, cbg 332.

## 2014-10-10 NOTE — ED Provider Notes (Signed)
CSN: [id number]     Arrival date & time 10/10/14  1309 History   First MD Initiated Contact with Patient 10/10/14 1337     Chief Complaint  Patient presents with  . Failure To Thrive     (Consider location/radiation/quality/duration/timing/severity/associated sxs/prior Treatment) HPI   [name] is a 81 y.o. male who is here for evaluation of cessation of oral intake.  According to family members.  He has not eaten for 2 weeks.  The patient states he is just not hungry.  There's been no vomiting.  Patient is frequently offered food by family members, but refuses to eat.  They tried to get him to take Ensure, but he would not do it.  He is alert, and attempts to cooperate according to their report.  He is taking his usual medications.  His PCP was contacted today and family members were encouraged to bring him here for evaluation.  He cannot give any additional history.  He is DO NOT RESUSCITATE  Level V Caveat- confusion     Past Medical History  Diagnosis Date  . Dementia   . Hypertension   . Gout   . Hyperlipidemia   . Fracture of lumbar spine 11-08-11  . Coronary artery disease   . Myocardial infarction 2004    NSTEMI/E-chart  . Chronic kidney disease (CKD), stage II (mild)     /E-chart  . Prostate cancer     S/P radiation  . Osteoarthritis     /E-chart  . GI bleeding 2004; 01/2011    /E-chart  . Blood transfusion   . Anemia   . Diverticulosis   . Type II diabetes mellitus   . GERD (gastroesophageal reflux disease)   . Dysrhythmia 05/01/2012    bradycardia   Past Surgical History  Procedure Laterality Date  . Left leg surgery  1950's    "injured in [place] war"  . Right shoulder surgery    . Left cataract extraction    . Tonsillectomy     History reviewed. No pertinent family history. History  Substance Use Topics  . Smoking status: Former Smoker -- 1.00 packs/day for 35 years    Types: Cigarettes  . Smokeless tobacco: Never Used     Comment: 11/16/11  "quit smoking 20-30 yr ago"  . Alcohol Use: No    Review of Systems  Unable to perform ROS     Allergies  Aspirin; Flexeril; and Ibuprofen  Home Medications   Prior to Admission medications   Medication Sig Start Date End Date Taking? Authorizing Provider  allopurinol (ZYLOPRIM) 300 MG tablet Take 300 mg by mouth daily.   Yes Historical Provider, MD  HYDROcodone-acetaminophen (NORCO/VICODIN) 5-325 MG per tablet Take 1 tablet by mouth 3 (three) times daily as needed for severe pain. Patient taking differently: Take 1 tablet by mouth daily as needed for severe pain.  07/20/14  Yes Reyne Dumas, MD  insulin aspart (NOVOLOG FLEXPEN) 100 UNIT/ML FlexPen Inject 10 Units into the skin 3 (three) times daily with meals.   Yes Historical Provider, MD  Insulin Glargine (LANTUS SOLOSTAR) 100 UNIT/ML Solostar Pen Inject 20 Units into the skin at bedtime.   Yes Historical Provider, MD  metoprolol tartrate (LOPRESSOR) 25 MG tablet TAKE 1/2 TABLET BY MOUTH TWICE DAILY 07/23/14  Yes Sueanne Margarita, MD  mirtazapine (REMERON) 15 MG tablet Take 15 mg by mouth at bedtime.   Yes Historical Provider, MD  omeprazole (PRILOSEC) 40 MG capsule Take 40 mg by mouth daily.  Yes Historical Provider, MD  rivaroxaban (XARELTO) 20 MG TABS tablet Take 1 tablet (20 mg total) by mouth daily with supper. 08/08/14  Yes Reyne Dumas, MD  sertraline (ZOLOFT) 50 MG tablet Take 50 mg by mouth daily.   Yes Historical Provider, MD  simvastatin (ZOCOR) 20 MG tablet Take 20 mg by mouth at bedtime.   Yes Historical Provider, MD  tamsulosin (FLOMAX) 0.4 MG CAPS capsule Take 0.4 mg by mouth daily after breakfast.   Yes Historical Provider, MD  acetaminophen (TYLENOL) 500 MG tablet Take 500 mg by mouth daily as needed (pain).    Historical Provider, MD  docusate sodium (COLACE) 100 MG capsule Take 100 mg by mouth 2 (two) times daily.    Historical Provider, MD  Fe Fum-FePoly-Vit C-Vit B3 (INTEGRA) 62.5-62.5-40-3 MG CAPS Take 1 capsule  by mouth daily.    Historical Provider, MD  furosemide (LASIX) 40 MG tablet TAKE 1 TABLET BY MOUTH DAILY. NEED AN APPOINTMENT FOR FURTHER REFILLS Patient not taking: Reported on 10/10/2014 09/27/14   Belva Crome III, MD  omeprazole (PRILOSEC) 20 MG capsule Take 20 mg by mouth daily.    Historical Provider, MD   BP 144/91 mmHg  Pulse 81  Temp(Src) 98.2 F (36.8 C) (Oral)  Resp 18  SpO2 100% Physical Exam  Constitutional: He appears well-developed.  Elderly, frail  HENT:  Head: Normocephalic and atraumatic.  Right Ear: External ear normal.  Left Ear: External ear normal.  Mucous membranes are dry  Eyes: Conjunctivae and EOM are normal. Pupils are equal, round, and reactive to light.  Neck: Normal range of motion and phonation normal. Neck supple.  Cardiovascular: Normal rate, regular rhythm and normal heart sounds.   Pulmonary/Chest: Effort normal and breath sounds normal. He exhibits no bony tenderness.  Abdominal: Soft. There is no tenderness. There is no guarding.  Musculoskeletal: Normal range of motion.  Neurological: He is alert. No cranial nerve deficit or sensory deficit. He exhibits normal muscle tone. Coordination normal.  Skin: Skin is warm, dry and intact.  Psychiatric: His behavior is normal.  Nursing note and vitals reviewed.   ED Course  Procedures (including critical care time)  Medications  0.9 %  sodium chloride infusion ( Intravenous New Bag/Given 10/10/14 1443)    Patient Vitals for the past 24 hrs:  BP Temp Temp src Pulse Resp SpO2  10/10/14 1307 144/91 mmHg 98.2 F (36.8 C) Oral 81 18 100 %    4:17 PM Reevaluation with update and discussion. After initial assessment and treatment, an updated evaluation reveals no change in clinical status.  Family members updated on findings.Daleen Bo L    4:17 PM-Consult complete with Dr. Dyann Kief. Patient case explained and discussed. He agrees to admit patient for further evaluation and treatment. Call ended at  16:20  Labs Review Labs Reviewed  URINALYSIS, ROUTINE W REFLEX MICROSCOPIC - Abnormal; Notable for the following:    Glucose, UA 250 (*)    Protein, ur 100 (*)    All other components within normal limits  BASIC METABOLIC PANEL - Abnormal; Notable for the following:    Sodium 132 (*)    Glucose, Bld 393 (*)    BUN 36 (*)    Creatinine, Ser 1.91 (*)    GFR calc non Af Amer 30 (*)    GFR calc Af Amer 35 (*)    All other components within normal limits  CBC WITH DIFFERENTIAL/PLATELET - Abnormal; Notable for the following:    RBC 4.09 (*)  Hemoglobin 11.5 (*)    HCT 36.9 (*)    All other components within normal limits  URINE CULTURE  URINE MICROSCOPIC-ADD ON    BUN  Date Value Ref Range Status  10/10/2014 36* 6 - 23 mg/dL Final  07/20/2014 19 6 - 23 mg/dL Final  07/19/2014 27* 6 - 23 mg/dL Final  07/18/2014 30* 6 - 23 mg/dL Final   CREATININE, SER  Date Value Ref Range Status  10/10/2014 1.91* 0.50 - 1.35 mg/dL Final  07/20/2014 1.15 0.50 - 1.35 mg/dL Final  07/19/2014 1.47* 0.50 - 1.35 mg/dL Final  07/18/2014 1.68* 0.50 - 1.35 mg/dL Final   HEMOGLOBIN  Date Value Ref Range Status  10/10/2014 11.5* 13.0 - 17.0 g/dL Final  07/19/2014 10.3* 13.0 - 17.0 g/dL Final  07/18/2014 10.8* 13.0 - 17.0 g/dL Final  07/17/2014 12.7* 13.0 - 17.0 g/dL Final   GLUCOSE, BLD  Date Value Ref Range Status  10/10/2014 393* 70 - 99 mg/dL Final  07/20/2014 222* 70 - 99 mg/dL Final  07/19/2014 289* 70 - 99 mg/dL Final  07/18/2014 278* 70 - 99 mg/dL Final      Imaging Review No results found.   EKG Interpretation None      MDM   Final diagnoses:  AKI (acute kidney injury)  Hyperglycemia  Anorexia    Decreased oral intake, with acute kidney injury and volume depletion.  Hemoglobin is elevated from his baseline, likely also consistent with dehydration.  No overt signs of infectious process.  Recurrent, ongoing hyperglycemia, cause unspecified.  Nursing Notes Reviewed/  Care Coordinated, and agree without changes. Applicable Imaging Reviewed.  Interpretation of Laboratory Data incorporated into ED treatment  Plan: Admit  Richarda Blade, MD 10/10/14 1655

## 2014-10-10 NOTE — ED Notes (Signed)
Attempted to call report.  Will call unit again.

## 2014-10-10 NOTE — ED Notes (Signed)
Bed: BZ96 Expected date:  Expected time:  Means of arrival:  Comments: EMS- Elderly, decreased appetite x2 weeks, weakness

## 2014-10-10 NOTE — ED Notes (Signed)
Pt Daughter: [name] [facility]) [phone]

## 2014-10-11 DIAGNOSIS — R630 Anorexia: Secondary | ICD-10-CM | POA: Diagnosis not present

## 2014-10-11 DIAGNOSIS — Z515 Encounter for palliative care: Secondary | ICD-10-CM | POA: Diagnosis not present

## 2014-10-11 DIAGNOSIS — R531 Weakness: Secondary | ICD-10-CM | POA: Diagnosis not present

## 2014-10-11 DIAGNOSIS — E1165 Type 2 diabetes mellitus with hyperglycemia: Secondary | ICD-10-CM

## 2014-10-11 DIAGNOSIS — I82409 Acute embolism and thrombosis of unspecified deep veins of unspecified lower extremity: Secondary | ICD-10-CM

## 2014-10-11 LAB — GLUCOSE, CAPILLARY
GLUCOSE-CAPILLARY: 265 mg/dL — AB (ref 70–99)
GLUCOSE-CAPILLARY: 212 mg/dL — AB (ref 70–99)
Glucose-Capillary: 304 mg/dL — ABNORMAL HIGH (ref 70–99)
Glucose-Capillary: 275 mg/dL — ABNORMAL HIGH (ref 70–99)

## 2014-10-11 LAB — BASIC METABOLIC PANEL
ANION GAP: 8 (ref 5–15)
BUN: 30 mg/dL — ABNORMAL HIGH (ref 6–23)
CHLORIDE: 105 mmol/L (ref 96–112)
CO2: 25 mmol/L (ref 19–32)
CREATININE: 1.52 mg/dL — AB (ref 0.50–1.35)
Calcium: 9.2 mg/dL (ref 8.4–10.5)
GFR calc Af Amer: 46 mL/min — ABNORMAL LOW (ref >90)
GFR, EST NON AFRICAN AMERICAN: 40 mL/min — AB (ref >90)
Glucose, Bld: 347 mg/dL — ABNORMAL HIGH (ref 70–99)
Potassium: 4.7 mmol/L (ref 3.5–5.1)
SODIUM: 138 mmol/L (ref 135–145)

## 2014-10-11 LAB — TSH: TSH: 2.159 u[IU]/mL (ref 0.350–4.500)

## 2014-10-11 LAB — URINE CULTURE
CULTURE: NO GROWTH
Colony Count: NO GROWTH
Special Requests: NORMAL

## 2014-10-11 MED ORDER — HYDRALAZINE HCL 20 MG/ML IJ SOLN
5.0000 mg | Freq: Four times a day (QID) | INTRAMUSCULAR | Status: DC | PRN
  Administered 2014-10-12: 5 mg via INTRAVENOUS
  Filled 2014-10-11: qty 1

## 2014-10-11 NOTE — Evaluation (Signed)
Physical Therapy Evaluation Patient Details Name: [name] MRN: [id number] DOB: 1926-02-08 Today's Date: 10/11/2014   History of Present Illness  Admitted with ARF after not having eaten for 2 weeks.  Clinical Impression  Pt admitted with above diagnosis. Pt currently with functional limitations due to the deficits listed below (see PT Problem List).  Pt will benefit from skilled PT to increase their independence and safety with mobility to allow discharge to the venue listed below.  Pt should be fine to go home with HHPT and family support as long as they can provide necessary level of assistance.  If they cannot, then may need to consider SNF as a second option.     Follow Up Recommendations Home health PT;Supervision/Assistance - 24 hour;Supervision for mobility/OOB    Equipment Recommendations       Recommendations for Other Services       Precautions / Restrictions Precautions Precautions: Fall      Mobility  Bed Mobility Overal bed mobility: +2 for physical assistance;Needs Assistance Bed Mobility: Supine to Sit     Supine to sit: Min assist;+2 for physical assistance        Transfers Overall transfer level: Needs assistance   Transfers: Sit to/from Stand Sit to Stand: Mod assist            Ambulation/Gait Ambulation/Gait assistance: Min assist;Mod assist Ambulation Distance (Feet): 45 Feet Assistive device: Rolling walker (2 wheeled) Gait Pattern/deviations: Decreased step length - right;Decreased step length - left;Trunk flexed     General Gait Details: Pt needing physical A to keep RW from going too far forward during gait.  Stairs            Wheelchair Mobility    Modified Rankin (Stroke Patients Only)       Balance Overall balance assessment: Needs assistance         Standing balance support: Bilateral upper extremity supported Standing balance-Leahy Scale: Poor Standing balance comment: with RW                              Pertinent Vitals/Pain Pain Assessment: No/denies pain    Home Living Family/patient expects to be discharged to:: Private residence Living Arrangements: Children Available Help at Discharge: Family;Available 24 hours/day Type of Home: House Home Access: Stairs to enter Entrance Stairs-Rails: None Entrance Stairs-Number of Steps: 2 Home Layout: One level Home Equipment: Walker - 2 wheels      Prior Function Level of Independence: Independent with assistive device(s);Needs assistance      ADL's / Homemaking Assistance Needed: Amb with RW. States someone is with him when he walks  Comments: Pt is HOH and difficult for clarification of prior level     Hand Dominance        Extremity/Trunk Assessment   Upper Extremity Assessment: Overall WFL for tasks assessed           Lower Extremity Assessment: Generalized weakness      Cervical / Trunk Assessment: Normal  Communication   Communication: HOH  Cognition Arousal/Alertness: Awake/alert Behavior During Therapy: WFL for tasks assessed/performed Overall Cognitive Status: Difficult to assess       Memory: Decreased recall of precautions              General Comments General comments (skin integrity, edema, etc.): Pt had difficulty with instructions due to [facility].    Exercises        Assessment/Plan    PT Assessment  Patient needs continued PT services  PT Diagnosis Difficulty walking   PT Problem List Decreased strength;Decreased balance;Decreased mobility  PT Treatment Interventions Gait training;Functional mobility training;Therapeutic activities;Therapeutic exercise;DME instruction   PT Goals (Current goals can be found in the Care Plan section) Acute Rehab PT Goals Patient Stated Goal: Pt agreeable to ambulate with PT PT Goal Formulation: With patient Time For Goal Achievement: 10/25/14 Potential to Achieve Goals: Good    Frequency Min 3X/week   Barriers to discharge         Co-evaluation               End of Session Equipment Utilized During Treatment: Gait belt Activity Tolerance: Patient tolerated treatment well Patient left: in chair;with call bell/phone within reach;with chair alarm set Nurse Communication: Mobility status         Time: 8003-4917 PT Time Calculation (min) (ACUTE ONLY): 19 min   Charges:   PT Evaluation $Initial PT Evaluation Tier I: 1 Procedure     PT G Codes:        [name] 10/11/2014, 1:38 PM

## 2014-10-11 NOTE — Consult Note (Signed)
Patient VW:PVXYIA THEODIS KINSEL      DOB: [birth year]      XKP:[id number]     Consult Note from the Palliative Medicine Team at [facility] Requested by: Dr. Dyann Kief     PCP: Lilian Coma, MD Reason for Consultation: [place]     Phone [place]  Assessment of patients Current state: I have met with Mr. Ky at bedside and I have spoken with daughters, [name] (he lives with her) and Helene Kelp over the phone. Mr. Corriher is difficult to communicate with d/t his impaired hearing. He tells me that he just has no appetite but otherwise feels well. He is able to tell me that he came to the hospital because he was not eating/drinking well. [name] tells me that at this point in his life she would like to get him home and keep him comfortable at home. She tells me that she sees an "endless cycle" of coming to the hospital with IV fluids and treatment just to be back in the same position. [name] has very good insight to his failure to thrive and his decline as she lives with him but fears her sister and brother have more questions and more reserve of comfort care. I also spoke with Helene Kelp as she has many questions and I explained his failure to thrive and artificial feeding and she says that she understands better. I have not spoken with their brother. [name] very much would like to have the assistance of hospice to help keep him comfortable at home and says they evaluated him in October but he was not accepted. It is my opinion that he would be eligible with goal of comfort and now very poor nutrition not desiring further aggressive treatment per [name] - she understands hospice will have to evaluate and make the decision to accept him. I was unable to complete MOST form as family not present but will approach if I am able to meet with family in the room. I will follow up tomorrow.    Goals of Care: 1.  Code Status: DNR   2. Disposition: Hopeful for home with hospice.    3. Symptom  Management:   1. Decreased appetite/Severe protein calorie malnutrition: Continue to offer foods he enjoys (I gave him a chocolate ice cream for breakfast at his request). Continue feeding supplements as he will accept/tolerate.  2. Weakness: Continue medical therapy including IVF and feeding supplements.   4. Psychosocial: Emotional support provided to patient and to family via telephone.     Brief HPI: 81 yo male admitted with weakness, anorexia, dehydration r/t poor intake and failure to thrive. He also has acute on chronic renal failure (CKD 3) d/t dehydration without s/s UTI. He has h/o prostate cancer with some urinary "leaking" per daughter but this is not new for him. She says he can ambulate with a walker at times and utilizes urinal as well. PMH reviewed below.    ROS: + decreased appetite    PMH:  Past Medical History  Diagnosis Date  . Dementia   . Hypertension   . Gout   . Hyperlipidemia   . Fracture of lumbar spine 11-08-11  . Coronary artery disease   . Myocardial infarction 2004    NSTEMI/E-chart  . Chronic kidney disease (CKD), stage II (mild)     /E-chart  . Prostate cancer     S/P radiation  . Osteoarthritis     /E-chart  . GI bleeding 2004; 01/2011    /  E-chart  . Blood transfusion   . Anemia   . Diverticulosis   . Type II diabetes mellitus   . GERD (gastroesophageal reflux disease)   . Dysrhythmia 05/01/2012    bradycardia     PSH: Past Surgical History  Procedure Laterality Date  . Left leg surgery  1950's    "injured in [place] war"  . Right shoulder surgery    . Left cataract extraction    . Tonsillectomy     I have reviewed the [place] and SH and  If appropriate update it with new information. Allergies  Allergen Reactions  . Aspirin Other (See Comments)    Gi bleed  . Flexeril [Cyclobenzaprine] Other (See Comments)    confusion  . Ibuprofen Other (See Comments)    "if I take it for awhile it has tendency to make me nervous"   Scheduled  Meds: . allopurinol  300 mg Oral Daily  . docusate sodium  100 mg Oral BID  . feeding supplement (GLUCERNA SHAKE)  237 mL Oral TID BM  . insulin aspart  0-15 Units Subcutaneous TID WC  . insulin aspart  0-5 Units Subcutaneous QHS  . insulin glargine  10 Units Subcutaneous QHS  . metoprolol tartrate  12.5 mg Oral BID  . mirtazapine  7.5 mg Oral QHS  . pantoprazole  40 mg Oral Daily  . rivaroxaban  20 mg Oral Q supper  . sertraline  50 mg Oral Daily  . tamsulosin  0.4 mg Oral QPC breakfast   Continuous Infusions: . sodium chloride 125 mL/hr at 10/11/14 0610   PRN Meds:.acetaminophen **OR** acetaminophen, HYDROcodone-acetaminophen, ondansetron **OR** ondansetron (ZOFRAN) IV    BP 170/73 mmHg  Pulse 99  Temp(Src) 98.6 F (37 C) (Oral)  Resp 18  Ht '5\' 11"'  (1.803 m)  Wt 69.4 kg (153 lb)  BMI 21.35 kg/m2  SpO2 99%   PPS: 30% at best   Intake/Output Summary (Last 24 hours) at 10/11/14 1010 Last data filed at 10/11/14 0931  Gross per 24 hour  Intake    540 ml  Output   1000 ml  Net   -460 ml   LBM: 10/09/13  Physical Exam:  General: NAD, thin, elderly HEENT: [place]/AT, no JVD, moist mucous membranes Chest:  No labored breathing, symmetric CVS: RRR Abdomen: Soft, NT, ND Ext: MAE, no edema, warm to touch Neuro: Awake, alert, oriented x 3  Labs: CBC    Component Value Date/Time   WBC 8.1 10/10/2014 1417   RBC 4.09* 10/10/2014 1417   HGB 11.5* 10/10/2014 1417   HCT 36.9* 10/10/2014 1417   PLT 251 10/10/2014 1417   MCV 90.2 10/10/2014 1417   MCH 28.1 10/10/2014 1417   MCHC 31.2 10/10/2014 1417   RDW 14.4 10/10/2014 1417   LYMPHSABS 2.0 10/10/2014 1417   MONOABS 0.7 10/10/2014 1417   EOSABS 0.2 10/10/2014 1417   BASOSABS 0.0 10/10/2014 1417    BMET    Component Value Date/Time   NA 138 10/11/2014 0437   K 4.7 10/11/2014 0437   CL 105 10/11/2014 0437   CO2 25 10/11/2014 0437   GLUCOSE 347* 10/11/2014 0437   BUN 30* 10/11/2014 0437   CREATININE 1.52*  10/11/2014 0437   CALCIUM 9.2 10/11/2014 0437   CALCIUM 8.0* 02/03/2011 0505   GFRNONAA 40* 10/11/2014 0437   GFRAA 46* 10/11/2014 0437    CMP     Component Value Date/Time   NA 138 10/11/2014 0437   K 4.7 10/11/2014 0437  CL 105 10/11/2014 0437   CO2 25 10/11/2014 0437   GLUCOSE 347* 10/11/2014 0437   BUN 30* 10/11/2014 0437   CREATININE 1.52* 10/11/2014 0437   CALCIUM 9.2 10/11/2014 0437   CALCIUM 8.0* 02/03/2011 0505   PROT 7.5 07/20/2014 1010   ALBUMIN 3.3* 07/20/2014 1010   AST 27 07/20/2014 1010   ALT 17 07/20/2014 1010   ALKPHOS 123* 07/20/2014 1010   BILITOT 0.4 07/20/2014 1010   GFRNONAA 40* 10/11/2014 0437   GFRAA 46* 10/11/2014 0437    Time In Time Out Total Time Spent with Patient Total Overall Time  0900 1010 6mn 739m    Greater than 50%  of this time was spent counseling and coordinating care related to the above assessment and plan.  AlVinie SillNP Palliative Medicine Team Pager # [phone]-F 8a-5p) Team Phone # [phone]/Weekends)

## 2014-10-11 NOTE — Progress Notes (Signed)
UR completed 

## 2014-10-11 NOTE — Progress Notes (Signed)
INITIAL NUTRITION ASSESSMENT  DOCUMENTATION CODES Per approved criteria  -Not Applicable   INTERVENTION: - Glucerna Shake po TID, each supplement provides 220 kcal and 10 grams of protein - Magic cup TID with meals, each supplement provides 290 kcal and 9 grams of protein - RD will continue to monitor  NUTRITION DIAGNOSIS: Inadequate oral intake related to generalized weakness and anorexia as evidenced by poor po intake.   Goal: Pt to meet >/= 90% of their estimated nutrition needs   Monitor:  Weight trend, po intake, acceptance of supplements, labs  Reason for Assessment: Consult  81 y.o. male  Admitting Dx: Acute renal failure superimposed on stage 3 chronic kidney disease  ASSESSMENT: 81 y.o. male with PMH significant for HTN, CKD stage 3, DM type 2 (on insulin); hx of dementia, HLD, GERD, gout, protein calorie malnutrition and depression. Who presented to ED with complaints of generalized weakness and anorexia.  - Palliative care consulted for goals of care.  - Pt reports that he had green beans and a roll for lunch. Meal completion recorded as 65% - Per RN, pt with poor po intake. He drank part of his Glucerna shake this morning.  - Unsure of usual body weight. Per chart history, pt's weight has fluctuated from 151-159 lbs.  - RD to order nutritional supplements. - No signs of significant fat or muscle wasting.   Labs: CBGs: 265-341 Na and K WNL BUN elevated  Height: Ht Readings from Last 1 Encounters:  10/10/14 5\' 11"  (1.803 m)    Weight: Wt Readings from Last 1 Encounters:  10/10/14 153 lb (69.4 kg)    Ideal Body Weight: 75.3 kg  % Ideal Body Weight: 109%  Wt Readings from Last 10 Encounters:  10/10/14 153 lb (69.4 kg)  08/10/14 159 lb 4.8 oz (72.258 kg)  07/20/14 151 lb 14 oz (68.889 kg)  05/04/12 140 lb (63.504 kg)  11/15/11 165 lb 9.1 oz (75.1 kg)  07/22/08 178 lb 6.4 oz (80.922 kg)  06/16/08 172 lb 6.4 oz (78.2 kg)  04/26/08 162 lb (73.483 kg)   04/06/08 162 lb 3.2 oz (73.573 kg)  03/10/08 164 lb (74.39 kg)   BMI:  Body mass index is 21.35 kg/(m^2).  Estimated Nutritional Needs: Kcal: 1700-1900 Protein: 90-100 g Fluid: 1.7-1.9 L/day  Skin: intact  Diet Order: Diet Carb Modified  EDUCATION NEEDS: -No education needs identified at this time   Intake/Output Summary (Last 24 hours) at 10/11/14 1512 Last data filed at 10/11/14 0931  Gross per 24 hour  Intake    540 ml  Output   1000 ml  Net   -460 ml    Last BM: prior to admission   Labs:   Recent Labs Lab 10/10/14 1417 10/10/14 2000 10/11/14 0437  NA 132*  --  138  K 4.0  --  4.7  CL 101  --  105  CO2 22  --  25  BUN 36*  --  30*  CREATININE 1.91*  --  1.52*  CALCIUM 9.3  --  9.2  MG  --  1.8  --   PHOS  --  3.2  --   GLUCOSE 393*  --  347*    CBG (last 3)   Recent Labs  10/10/14 2042 10/11/14 0800 10/11/14 1204  GLUCAP 341* 265* 304*    Scheduled Meds: . allopurinol  300 mg Oral Daily  . docusate sodium  100 mg Oral BID  . feeding supplement (GLUCERNA SHAKE)  237 mL Oral  TID BM  . insulin aspart  0-15 Units Subcutaneous TID WC  . insulin aspart  0-5 Units Subcutaneous QHS  . insulin glargine  10 Units Subcutaneous QHS  . metoprolol tartrate  12.5 mg Oral BID  . mirtazapine  7.5 mg Oral QHS  . pantoprazole  40 mg Oral Daily  . rivaroxaban  20 mg Oral Q supper  . sertraline  50 mg Oral Daily  . tamsulosin  0.4 mg Oral QPC breakfast    Continuous Infusions: . sodium chloride 75 mL/hr at 10/11/14 1120    Past Medical History  Diagnosis Date  . Dementia   . Hypertension   . Gout   . Hyperlipidemia   . Fracture of lumbar spine 11-08-11  . Coronary artery disease   . Myocardial infarction 2004    NSTEMI/E-chart  . Chronic kidney disease (CKD), stage II (mild)     /E-chart  . Prostate cancer     S/P radiation  . Osteoarthritis     /E-chart  . GI bleeding 2004; 01/2011    /E-chart  . Blood transfusion   . Anemia   .  Diverticulosis   . Type II diabetes mellitus   . GERD (gastroesophageal reflux disease)   . Dysrhythmia 05/01/2012    bradycardia    Past Surgical History  Procedure Laterality Date  . Left leg surgery  1950's    "injured in [place] war"  . Right shoulder surgery    . Left cataract extraction    . Tonsillectomy      Laurette Schimke MS, RD, LDN

## 2014-10-11 NOTE — Progress Notes (Signed)
Patient ID: [name], male   DOB: 05/16/1926, 81 y.o.   MRN: [id number] [facility] PROGRESS NOTE  [name] GHW:[id number] DOB: 1925/09/20 DOA: 01-Nov-2014 PCP: Jeff Coma, MD  Brief narrative:    81 year old male with history of hypertension, chronic kidney disease stage III, diabetes mellitus, dementia, dyslipidemia, gout, GERD, DVT on xarelto, protein calorie malnutrition, depression who presented to [facility] ED with progressive failure to thrive, generalized weakness. Apparently patient has not been eating well for past couple of weeks prior to this admission. Chest x-ray on admission showed no active disease. Urinalysis was unremarkable. Blood work showed mild anemia with hemoglobin of 11.5, mild hyponatremia of 132, creatinine of 1.91, glucose 393. Patient was admitted for further treatment, hydration.   Assessment/Plan:    Principal Problem: Adult failure to thrive / generalized weakness / dehydration - Likely secondary to multiple comorbidities including but not limited to dementia - Started hydration with IV fluids, continue to provide supportive care - Physical therapy patient able to participate - Nutrition consulted - Palliative care consulted for goals of care  Active Problems: Chronic kidney disease, stage III - Creatinine on this admission 1.91 and is already trending down to 1.5 with IV fluids - Continue current IV fluids, normal saline at 75 mL an hour  Diabetes mellitus type 2, uncontrolled, without complications - Continue Lantus 10 units at bedtime along with sliding scale insulin - Appreciate diabetic coordinator recommendations  GERD - Continue Protonix 40 mg daily  History of DVT in lower extremity - Continue anticoagulation with rivaroxaban  Severe protein calorie malnutrition - Continue nutritional supplements - Dietitian consulted  History of depression - Continue Zoloft, mirtazapine  Essential hypertension - Continue low-dose  metoprolol 12.5 mg by mouth twice a day  History of gout - Continue allopurinol   DVT Prophylaxis  - SCD's bilaterally   Code Status: DNR/DNI Family Communication:  Family not at the bedside this morning Disposition Plan: Home when stable.   IV access:  Peripheral IV  Procedures and diagnostic studies:    Dg Chest Port 1 View 11-01-14   No active disease.   Electronically Signed   By: [name]   On: 11/01/14 18:10    Medical Consultants:  Palliative care  Other Consultants:  Physical therapy Nutrition  IAnti-Infectives:   None   Jeff Lenz, MD  [facility] Pager [phone]  If 7PM-7AM, please contact night-coverage [email or web address] Password [email or web address] 10/11/2014, 11:13 AM   LOS: 1 day    HPI/Subjective: No acute overnight events.  Objective: Filed Vitals:   11-01-2014 1307 11/01/2014 1746 11/01/2014 1919 10/11/14 0555  BP: 144/91 172/92 195/89 170/73  Pulse: 81 71 72 99  Temp: 98.2 F (36.8 C) 98.1 F (36.7 C) 98.2 F (36.8 C) 98.6 F (37 C)  TempSrc: Oral Oral Oral Oral  Resp: 18 18 19 18   Height:   5\' 11"  (1.803 m)   Weight:   69.4 kg (153 lb)   SpO2: 100% 98% 99% 99%    Intake/Output Summary (Last 24 hours) at 10/11/14 1113 Last data filed at 10/11/14 0931  Gross per 24 hour  Intake    540 ml  Output   1000 ml  Net   -460 ml    Exam:   General:  Pt is sleeping this am, no distress  Cardiovascular: Regular rate and rhythm, S1/S2 appreciated   Respiratory: no wheezing, no distress  Abdomen: Appreciated bowel sounds, nontender and nondistended  Extremities: No edema, pulses palpable  Neuro: Grossly  nonfocal  Data Reviewed: Basic Metabolic Panel:  Recent Labs Lab 10/10/14 1417 10/10/14 2000 10/11/14 0437  NA 132*  --  138  K 4.0  --  4.7  CL 101  --  105  CO2 22  --  25  GLUCOSE 393*  --  347*  BUN 36*  --  30*  CREATININE 1.91*  --  1.52*  CALCIUM 9.3  --  9.2  MG  --  1.8  --   PHOS  --  3.2  --    Liver Function  Tests: No results for input(s): AST, ALT, ALKPHOS, BILITOT, PROT, ALBUMIN in the last 168 hours. No results for input(s): LIPASE, AMYLASE in the last 168 hours. No results for input(s): AMMONIA in the last 168 hours. CBC:  Recent Labs Lab 10/10/14 1417  WBC 8.1  NEUTROABS 5.2  HGB 11.5*  HCT 36.9*  MCV 90.2  PLT 251   Cardiac Enzymes: No results for input(s): CKTOTAL, CKMB, CKMBINDEX, TROPONINI in the last 168 hours. BNP: Invalid input(s): POCBNP CBG:  Recent Labs Lab 10/10/14 1739 10/10/14 2042 10/11/14 0800  GLUCAP 335* 341* 265*    No results found for this or any previous visit (from the past 240 hour(s)).   Scheduled Meds: . allopurinol  300 mg Oral Daily  . docusate sodium  100 mg Oral BID  . feeding supplement (GLUCERNA SHAKE)  237 mL Oral TID BM  . insulin aspart  0-15 Units Subcutaneous TID WC  . insulin aspart  0-5 Units Subcutaneous QHS  . insulin glargine  10 Units Subcutaneous QHS  . metoprolol tartrate  12.5 mg Oral BID  . mirtazapine  7.5 mg Oral QHS  . pantoprazole  40 mg Oral Daily  . rivaroxaban  20 mg Oral Q supper  . sertraline  50 mg Oral Daily  . tamsulosin  0.4 mg Oral QPC breakfast   Continuous Infusions: . sodium chloride 125 mL/hr at 10/11/14 [phone]

## 2014-10-11 NOTE — Progress Notes (Signed)
Inpatient Diabetes Program Recommendations  AACE/ADA: New Consensus Statement on Inpatient Glycemic Control (2013)  Target Ranges:  Prepandial:   less than 140 mg/dL      Peak postprandial:   less than 180 mg/dL (1-2 hours)      Critically ill patients:  140 - 180 mg/dL   Reason for Visit: Hyperglycemia Diabetes history: DM2 Outpatient Diabetes medications: Lantus 10 units QHS, Novolog 10 units tidwc Current orders for Inpatient glycemic control: Lantus 10 units QHS and Novolog moderate tidwc and hs  Results for [name], [name] (MRN [id number]) as of 10/11/2014 10:05  Ref. Range 10/10/2014 17:39 10/10/2014 20:42 10/11/2014 08:00  Glucose-Capillary Latest Range: 70-99 mg/dL 335 (H) 341 (H) 265 (H)   Uncontrolled blood sugars.  Inpatient Diabetes Program Recommendations Insulin - Basal: Increase Lantus to 15 units QHS Insulin - Meal Coverage: Add Novolog 4 units tidwc if pt eats >50% meal HgbA1C: 10.5% - may not be accurate with low hgb  Note: Will continue to follow. Thank you. Lorenda Peck, RD, LDN, CDE Inpatient Diabetes Coordinator [phone]

## 2014-10-12 DIAGNOSIS — R630 Anorexia: Secondary | ICD-10-CM

## 2014-10-12 LAB — GLUCOSE, CAPILLARY
GLUCOSE-CAPILLARY: 239 mg/dL — AB (ref 70–99)
GLUCOSE-CAPILLARY: 186 mg/dL — AB (ref 70–99)
Glucose-Capillary: 191 mg/dL — ABNORMAL HIGH (ref 70–99)

## 2014-10-12 LAB — COMPREHENSIVE METABOLIC PANEL
ALT: 13 U/L (ref 0–53)
ANION GAP: 8 (ref 5–15)
AST: 18 U/L (ref 0–37)
Albumin: 3.3 g/dL — ABNORMAL LOW (ref 3.5–5.2)
Alkaline Phosphatase: 85 U/L (ref 39–117)
BILIRUBIN TOTAL: 0.5 mg/dL (ref 0.3–1.2)
BUN: 20 mg/dL (ref 6–23)
CO2: 21 mmol/L (ref 19–32)
CREATININE: 1.25 mg/dL (ref 0.50–1.35)
Calcium: 9.2 mg/dL (ref 8.4–10.5)
Chloride: 106 mmol/L (ref 96–112)
GFR, EST AFRICAN AMERICAN: 58 mL/min — AB (ref >90)
GFR, EST NON AFRICAN AMERICAN: 50 mL/min — AB (ref >90)
Glucose, Bld: 250 mg/dL — ABNORMAL HIGH (ref 70–99)
Potassium: 4.1 mmol/L (ref 3.5–5.1)
SODIUM: 135 mmol/L (ref 135–145)
Total Protein: 6.8 g/dL (ref 6.0–8.3)

## 2014-10-12 MED ORDER — INSULIN GLARGINE 100 UNIT/ML ~~LOC~~ SOLN: 10.0000 [IU] | Freq: Every day | SUBCUTANEOUS | Status: AC

## 2014-10-12 MED ORDER — GLUCERNA SHAKE PO LIQD: 237.0000 mL | Freq: Three times a day (TID) | ORAL | Status: AC

## 2014-10-12 MED ORDER — HYDROCODONE-ACETAMINOPHEN 5-325 MG PO TABS: 1.0000 | ORAL_TABLET | Freq: Every day | ORAL | Status: AC | PRN

## 2014-10-12 NOTE — Progress Notes (Signed)
CSW received notification from RN that pt is likely to dc home this afternoon with [facility] and pt will need ambulance transport home.   CSW confirmed address with pt daughter via telephone.   CSW provided all of the documents in pt shadow chart for [facility] and RN aware to contact [facility] for ambulance transport once pt ready.  If pt discharge prior to 5 pm, RN to call [phone] for [facility]. If pt discharged after 5 pm, RN to call [phone] option 1 for English and option 3 for non-emergency transport.   No further social work needs identified at this time.  CSW signing off.   Alison Murray, MSW, [place] Work [phone]

## 2014-10-12 NOTE — Care Management Note (Signed)
CARE MANAGEMENT NOTE 10/12/2014  Patient:  KOSTA, SCHNITZLER   Account Number:  [id number]  Date Initiated:  10/12/2014  Documentation initiated by:  Marney Doctor  Subjective/Objective Assessment:   81 yo admitted with acute renal failure     Action/Plan:   From home with daughter [name]   Anticipated DC Date:  10/12/2014   Anticipated DC Plan:  [place]  CM consult      [facility] Choice  HOSPICE   Choice offered to / List presented to:  C-4 Adult Children           Status of service:  In process, will continue to follow Medicare Important Message given?   (If response is "NO", the following Medicare IM given date fields will be blank) Date Medicare IM given:   Medicare IM given by:   Date Additional Medicare IM given:   Additional Medicare IM given by:    Discharge Disposition:    Per UR Regulation:  Reviewed for med. necessity/level of care/duration of stay  If discussed at [place] of Stay Meetings, dates discussed:    Comments:  10/12/14 Marney Doctor RN,BSN,NCM Per Lesleigh Noe from [facility], pt has been accepted as a [facility] patient.  Margie to talk to family this afternoon and pt will be [place] home via ambulance.  10/12/14 Marney Doctor RN,BSN,NCM [phone] This CM was alerted by palliative care NP that pts daughter would like to talk about having [facility] services at DC.  Spoke with pt and asked if I could communicate with his daughter by phone to discuss his care at discharge.  Pt confirmed that this was ok.  Spoke with daughter [name] by phone who states that pt had been evaluated in October of last year by [facility] and was not eligible for there services at that time.  She would like pt re-evaluated to see if they would accept pt now.  [facility] rep called to give referral and will await [facility] decision.

## 2014-10-12 NOTE — Care Management Note (Signed)
CARE MANAGEMENT NOTE 10/12/2014  Patient:  [name], [name]   Account Number:  [id number]  Date Initiated:  10/12/2014  Documentation initiated by:  Marney Doctor  Subjective/Objective Assessment:   81 yo admitted with acute renal failure     Action/Plan:   From home with daughter [name]   Anticipated DC Date:  10/12/2014   Anticipated DC Plan:  [place]  CM consult      [facility] Choice  HOSPICE   Choice offered to / List presented to:  C-4 Adult Children           Status of service:  In process, will continue to follow Medicare Important Message given?   (If response is "NO", the following Medicare IM given date fields will be blank) Date Medicare IM given:   Medicare IM given by:   Date Additional Medicare IM given:   Additional Medicare IM given by:    Discharge Disposition:    Per UR Regulation:  Reviewed for med. necessity/level of care/duration of stay  If discussed at [place] of Stay Meetings, dates discussed:    Comments:  10/12/14 Marney Doctor RN,BSN,NCM [phone] This CM was alerted by palliative care NP that pts daughter would like to talk about having [facility] services at DC.  Spoke with pt and asked if I could communicate with his daughter by phone to discuss his care at discharge.  Pt confirmed that this was ok.  Spoke with daughter [name] by phone who states that pt had been evaluated in October of last year by [facility] and was not eligible for there services at that time.  She would like pt re-evaluated to see if they would accept pt now.  [facility] rep called to give referral and will await [facility] decision.

## 2014-10-12 NOTE — Progress Notes (Signed)
Notified by Con[place], patient and family request services of [facility]) after discharge. Chart and patient information reviewed with Dr Alferd Patee, [place] Director and hospice eligibility confirmed.  Spoke with daughter [name] and son Aaric Dolph at bedside to initiate education related to hospice services, philosophy and team approach to care; they voiced good understanding of information provided. [name] and [place] shared that they have noticed over the last 3-4 weeks pt has been intermittently more confused, incontinent and, most recently, sleeping 75% of the day and taking only sips/bites at best.  Per [name] she has had discussions with her father; he previously decided on a DNR status and would not want further aggressive treatment for any of the current issues. Both [name] and her brother stated while it is difficult to realize their Dad's time is limited, they want whatever time he has left to be quality time focused on comfort at home. [name] is primary caregiver and stated she quit her job to care for her father 24/7; she has good support from her family.   Per discussion with staff RN and CM- plan is to d/c today when family is ready. Per Daughter [name] she needs to ready his room and will be able to have him home this afternoon.   *Family requesting [facility] transport and to leave condom catheter in place at discharge for comfort * Please send completed GOLD DNR Form to home with pt.  Daughter is agreeable to [facility] Admission RN seeing pt/family tomorrow Wednesday 10/13/14 at 9:30 am  DME needs discussed per daughter pt has a queen bed; walker, BSC and shower chair at home;  [name] stated she wants to discuss additional DME with family (getting hospital bed and wheel chair) and prefers to wait and let the [facility] admission RN know their decision tomorrow   Initial paperwork faxed to [facility] *Completed d/c summary will need to be faxed to [place] @ [phone] when final Please notify [facility] when patient is ready to leave unit at d/c call [phone] (or [phone] if after 5 pm); [facility] information and contact numbers also given to daughter [name] during visit.   Above information shared with Staff RN Hoyle Sauer and [facility] Alinda Sierras Please call with any questions or concerns   Danton Sewer, RN 10/12/2014, 3:34 PM [facility] [place] [facility] RN Liaison [phone]

## 2014-10-12 NOTE — Discharge Summary (Signed)
Physician Discharge Summary  [name] XFG:[id number] DOB: 11-Jul-1926 DOA: 10/10/2014  PCP: Lilian Coma, MD  Admit date: 10/10/2014 Discharge date: 10/12/2014  Recommendations for Outpatient Follow-up:  1. Pt will be discharged with hospice care  Discharge Diagnoses:  Principal Problem:   Acute renal failure superimposed on stage 3 chronic kidney disease Active Problems:   Diabetes mellitus type 2, uncontrolled, without complications   GERD   Dehydration   Failure to thrive in adult   Prostate cancer   Generalized weakness   DVT of leg (deep venous thrombosis)   Depression   Palliative care encounter   Decreased appetite    Discharge Condition: stable   Diet recommendation: as tolerated   History of present illness:  81 year old male with history of hypertension, chronic kidney disease stage III, diabetes mellitus, dementia, dyslipidemia, gout, GERD, DVT on xarelto, protein calorie malnutrition, depression who presented to [facility] ED with progressive failure to thrive, generalized weakness. Apparently patient has not been eating well for past couple of weeks prior to this admission. Chest x-ray on admission showed no active disease. Urinalysis was unremarkable. Blood work showed mild anemia with hemoglobin of 11.5, mild hyponatremia of 132, creatinine of 1.91, glucose 393. Patient was admitted for further treatment, hydration.   Assessment/Plan:    Principal Problem: Adult failure to thrive / generalized weakness / dehydration - Likely secondary to multiple comorbidities including but not limited to dementia - Started hydration with IV fluids on admission - palliative consulted for goals of care - pt to be discharged home with hospice - family in agreement   Active Problems: Chronic kidney disease, stage III - Creatinine on this admission 1.91 and has trended down to 1.5 with IV fluids  Diabetes mellitus type 2, uncontrolled, without complications - Continue  Lantus 10 units at bedtime   GERD - Continue Protonix 40 mg daily  History of DVT in lower extremity - Continue anticoagulation with rivaroxaban  Severe protein calorie malnutrition - Continue nutritional supplements - Dietitian consulted  History of depression - Continue Zoloft, mirtazapine  Essential hypertension - Continue low-dose metoprolol 12.5 mg by mouth twice a day  History of gout - Continue allopurinol   DVT Prophylaxis  - SCD's bilaterally   Code Status: DNR/DNI Family Communication: Family not at the bedside this morning   IV access:  Peripheral IV  Procedures and diagnostic studies:   Dg Chest Port 1 View 10/10/2014 No active disease. Electronically Signed By: Logan Bores On: 10/10/2014 18:10    Medical Consultants:  Palliative care  Other Consultants:  Physical therapy Nutrition  IAnti-Infectives:   None   Signed:  Leisa Lenz, MD  [facility] 10/12/2014, 10:45 AM  Pager #: [phone]    Discharge Exam: Filed Vitals:   10/12/14 0424  BP: 187/88  Pulse: 80  Temp: 98.8 F (37.1 C)  Resp: 16   Filed Vitals:   10/10/14 1919 10/11/14 0555 10/11/14 1426 10/12/14 0424  BP: 195/89 170/73 175/83 187/88  Pulse: 72 99 66 80  Temp: 98.2 F (36.8 C) 98.6 F (37 C) 97.4 F (36.3 C) 98.8 F (37.1 C)  TempSrc: Oral Oral Oral Oral  Resp: 19 18 16 16   Height: 5\' 11"  (1.803 m)     Weight: 69.4 kg (153 lb)   73.982 kg (163 lb 1.6 oz)  SpO2: 99% 99% 100% 100%    General: Pt is not in distress, awake  Cardiovascular: Regular rate and rhythm, S1/S2 appreciated  Respiratory:no wheezing, no crackles, no rhonchi Abdominal:  non distended, bowel sounds +, no guarding Extremities: no cyanosis, pulses palpable bilaterally DP and PT Neuro: nonfocal  Discharge Instructions  Discharge Instructions    Call MD for:  difficulty breathing, headache or visual disturbances    Complete by:  As directed      Call  MD for:  persistant nausea and vomiting    Complete by:  As directed      Call MD for:  severe uncontrolled pain    Complete by:  As directed      Diet - low sodium heart healthy    Complete by:  As directed      Increase activity slowly    Complete by:  As directed             Medication List    STOP taking these medications        furosemide 40 MG tablet  Commonly known as:  LASIX     LANTUS SOLOSTAR 100 UNIT/ML Solostar Pen  Generic drug:  Insulin Glargine  Replaced by:  insulin glargine 100 UNIT/ML injection     NOVOLOG FLEXPEN 100 UNIT/ML FlexPen  Generic drug:  insulin aspart      TAKE these medications        acetaminophen 500 MG tablet  Commonly known as:  TYLENOL  Take 500 mg by mouth daily as needed (pain).     allopurinol 300 MG tablet  Commonly known as:  ZYLOPRIM  Take 300 mg by mouth daily.     docusate sodium 100 MG capsule  Commonly known as:  COLACE  Take 100 mg by mouth 2 (two) times daily.     feeding supplement (GLUCERNA SHAKE) Liqd  Take 237 mLs by mouth 3 (three) times daily between meals.     HYDROcodone-acetaminophen 5-325 MG per tablet  Commonly known as:  NORCO/VICODIN  Take 1 tablet by mouth daily as needed for severe pain.     insulin glargine 100 UNIT/ML injection  Commonly known as:  LANTUS  Inject 0.1 mLs (10 Units total) into the skin at bedtime.     INTEGRA 62.5-62.5-40-3 MG Caps  Take 1 capsule by mouth daily.     metoprolol tartrate 25 MG tablet  Commonly known as:  LOPRESSOR  TAKE 1/2 TABLET BY MOUTH TWICE DAILY     mirtazapine 15 MG tablet  Commonly known as:  REMERON  Take 15 mg by mouth at bedtime.     omeprazole 20 MG capsule  Commonly known as:  PRILOSEC  Take 20 mg by mouth daily.     rivaroxaban 20 MG Tabs tablet  Commonly known as:  XARELTO  Take 1 tablet (20 mg total) by mouth daily with supper.     sertraline 50 MG tablet  Commonly known as:  ZOLOFT  Take 50 mg by mouth daily.     tamsulosin 0.4  MG Caps capsule  Commonly known as:  FLOMAX  Take 0.4 mg by mouth daily after breakfast.           Follow-up Information    Follow up with Lilian Coma, MD. Schedule an appointment as soon as possible for a visit in 1 week.   Specialty:  Family Medicine   Why:  Follow up appt after recent hospitalization   Contact information:   [place] [address] [phone]        The results of significant diagnostics from this hospitalization (including imaging, microbiology, ancillary and laboratory) are listed below  for reference.    Significant Diagnostic Studies: Nm Bone Scan Whole Body  09/27/2014   CLINICAL DATA:  Prostate carcinoma. Low back pain. Elevated PSA level.  EXAM: NUCLEAR MEDICINE WHOLE BODY BONE SCAN  TECHNIQUE: Whole body anterior and posterior images were obtained approximately 3 hours after intravenous injection of radiopharmaceutical.  RADIOPHARMACEUTICALS:  25.7 mCi Technetium-99 MDP  COMPARISON:  CTs on 07/19/2014 and 11/08/2011, and bone scan on 03/20/2011  FINDINGS: Increased radiopharmaceutical uptake is seen within the L1 and L2 vertebra since prior bone scan in 2012. This correlates with L1 vertebral compression fracture and degenerative changes at these lumbar levels seen on prior CTs of 2015 and 2013.  Uptake in the right shoulder, sternoclavicular joints, left hip, and left knee are again demonstrated, consistent with degenerative arthritis. No other suspicious sites of osseous radiopharmaceutical uptake are identified.  IMPRESSION: No evidence of osseous metastatic disease.  Radiopharmaceutical uptake at L1 and L2, consistent with L1 vertebral compression fracture and lumbar degenerative changes demonstrated on prior CTs dating back to 2013.   Electronically Signed   By: Earle Gell M.D.   On: 09/27/2014 13:48   Dg Chest Port 1 View  10/10/2014   CLINICAL DATA:  Weakness.  EXAM: PORTABLE CHEST - 1 VIEW  COMPARISON:  07/17/2014   FINDINGS: Cardiomediastinal silhouette is within normal limits. There is slight elevation of the right hemidiaphragm, similar to prior. No airspace consolidation, edema, pleural effusion, or pneumothorax is identified. No acute osseous abnormality is identified.  IMPRESSION: No active disease.   Electronically Signed   By: Logan Bores   On: 10/10/2014 18:10    Microbiology: Recent Results (from the past 240 hour(s))  Urine culture     Status: None   Collection Time: 10/10/14  3:21 PM  Result Value Ref Range Status   Specimen Description URINE, CATHETERIZED  Final   Special Requests Normal  Final   Colony Count NO GROWTH Performed at [facility]   Final   Culture NO GROWTH Performed at [facility]   Final   Report Status 10/11/2014 FINAL  Final     Labs: Basic Metabolic Panel:  Recent Labs Lab 10/10/14 1417 10/10/14 2000 10/11/14 0437  NA 132*  --  138  K 4.0  --  4.7  CL 101  --  105  CO2 22  --  25  GLUCOSE 393*  --  347*  BUN 36*  --  30*  CREATININE 1.91*  --  1.52*  CALCIUM 9.3  --  9.2  MG  --  1.8  --   PHOS  --  3.2  --    Liver Function Tests: No results for input(s): AST, ALT, ALKPHOS, BILITOT, PROT, ALBUMIN in the last 168 hours. No results for input(s): LIPASE, AMYLASE in the last 168 hours. No results for input(s): AMMONIA in the last 168 hours. CBC:  Recent Labs Lab 10/10/14 1417  WBC 8.1  NEUTROABS 5.2  HGB 11.5*  HCT 36.9*  MCV 90.2  PLT 251   Cardiac Enzymes: No results for input(s): CKTOTAL, CKMB, CKMBINDEX, TROPONINI in the last 168 hours. BNP: BNP (last 3 results) No results for input(s): PROBNP in the last 8760 hours. CBG:  Recent Labs Lab 10/11/14 0800 10/11/14 1204 10/11/14 1717 10/11/14 2107 10/12/14 0733  GLUCAP 265* 304* 275* 212* 191*    Time coordinating discharge: Over 30 minutes

## 2014-10-12 NOTE — Progress Notes (Signed)
Patient was stable at time of discharge. I reported to patient's daughter, [name], that patient was being transferred home. Patient left with his belongings.

## 2014-10-13 NOTE — Progress Notes (Signed)
   10/11/14 1337  PT G-Codes **NOT FOR INPATIENT CLASS**  Functional Assessment Tool Used clinical judgement per evaluating therapsits Orlinda Blalock)   Functional Limitation Mobility: Walking and moving around  Mobility: Walking and Moving Around Current Status [phone]) CJ  Mobility: Walking and Moving Around Goal Status (P8251) CI  G- Codes entered for Orlinda Blalock, PT based upon her clinical documentation  Clide Dales, PT Pager: [phone] 10/13/2014

## 2014-12-17 DEATH — deceased
